# Patient Record
Sex: Female | Born: 1935 | Race: White | Hispanic: No
Health system: Southern US, Community
[De-identification: ages and names within clinical notes are randomized; demographics above are authoritative.]

## PROBLEM LIST (undated history)

## (undated) DIAGNOSIS — J45909 Unspecified asthma, uncomplicated: Secondary | ICD-10-CM

## (undated) DIAGNOSIS — F0390 Unspecified dementia without behavioral disturbance: Secondary | ICD-10-CM

## (undated) DIAGNOSIS — D649 Anemia, unspecified: Secondary | ICD-10-CM

---

## 2008-09-26 ENCOUNTER — Ambulatory Visit: Payer: Self-pay | Admitting: Endocrinology

## 2010-12-02 ENCOUNTER — Ambulatory Visit: Payer: Self-pay | Admitting: Surgery

## 2010-12-03 LAB — PATHOLOGY REPORT

## 2011-06-05 ENCOUNTER — Ambulatory Visit: Payer: Self-pay | Admitting: Surgery

## 2011-12-12 IMAGING — CR US BIOPSY BREAST CORE VACUUM ASSIST
7 of 8 series · 7 of 8 positions shown · non-contrast
Comparison: none

REASON FOR EXAM: LT MICROCALCIFICATIONS
COMMENTS:

[CC (1 of 7)]
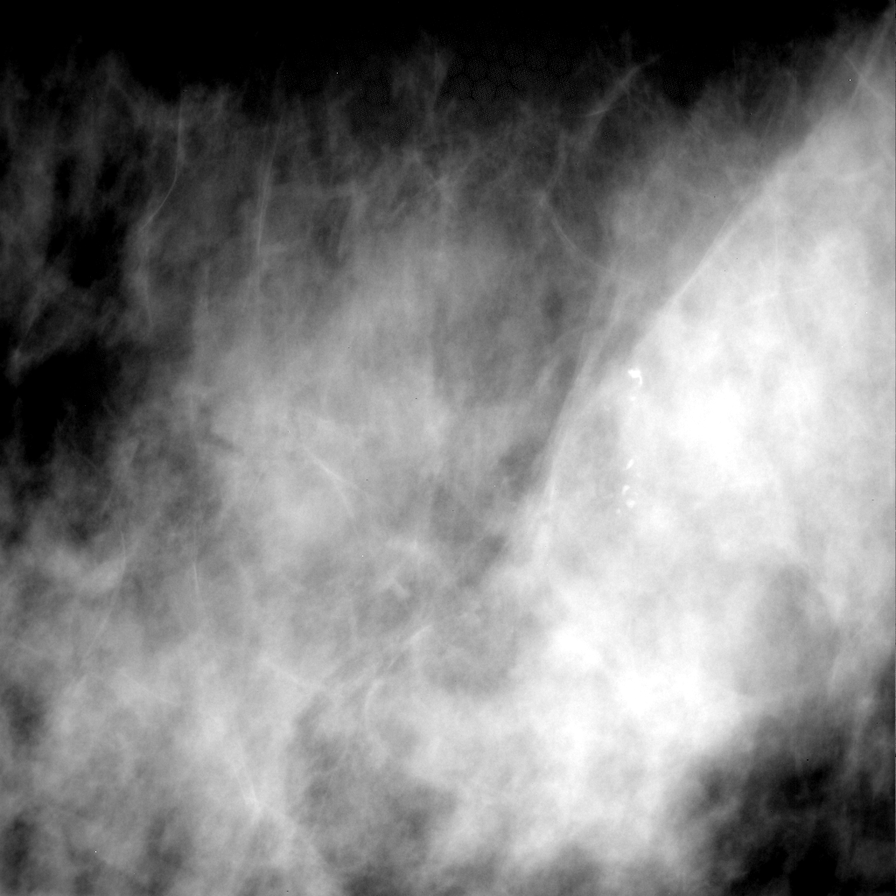

[CC (2 of 7)]
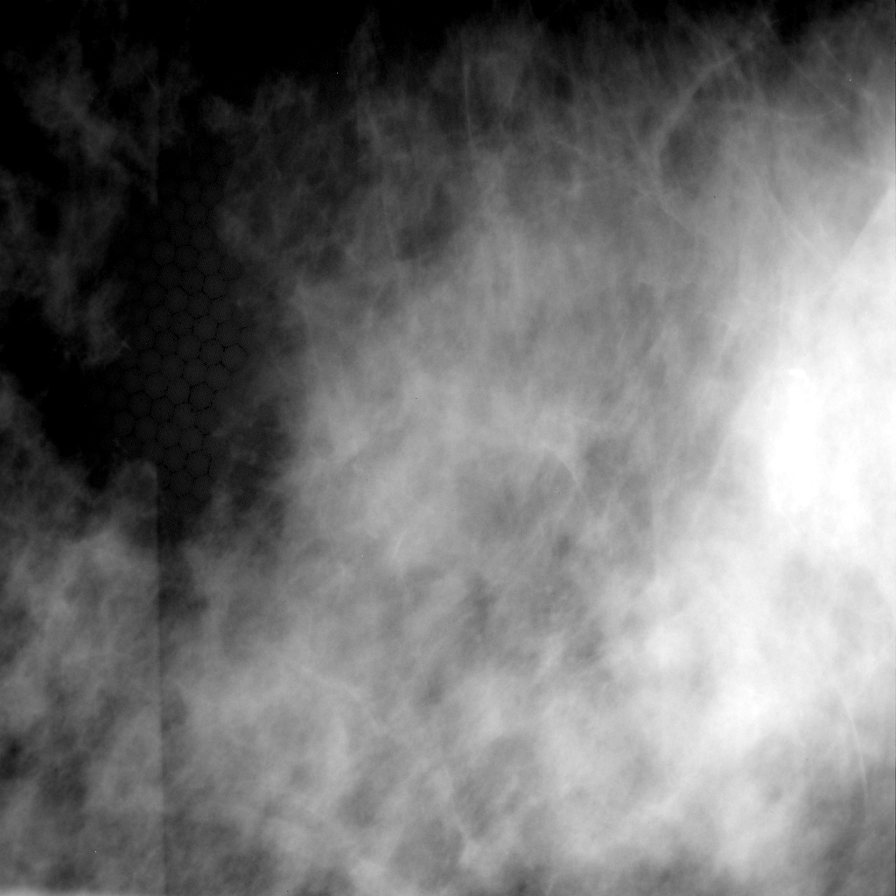

[CC (3 of 7)]
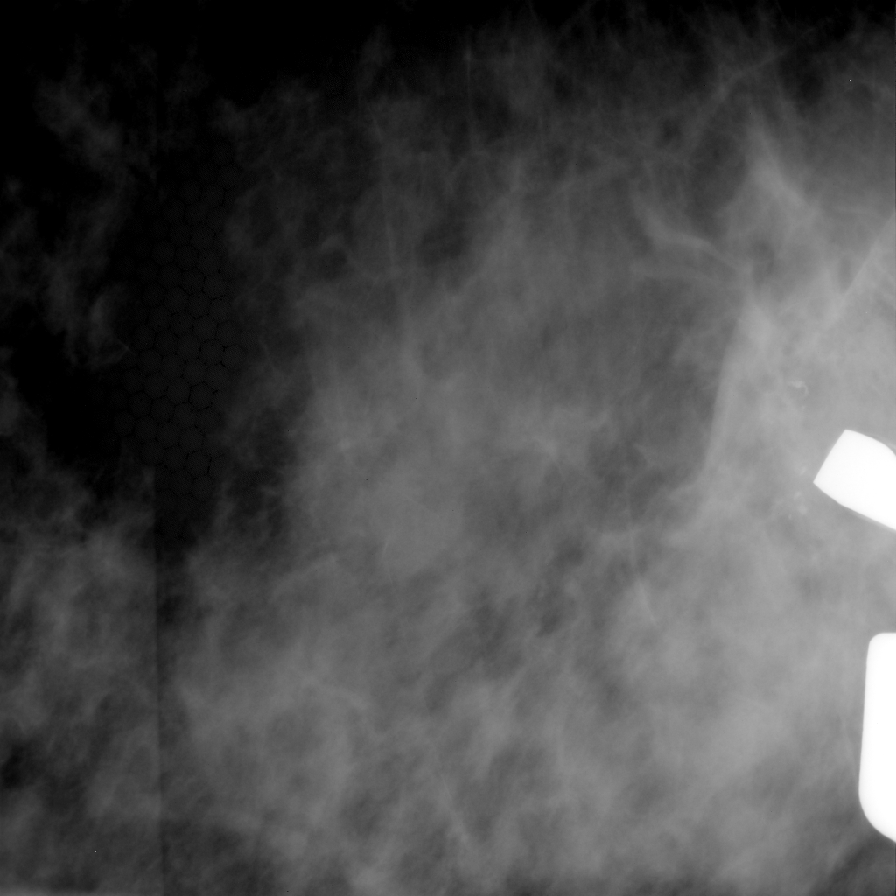

[CC (4 of 7)]
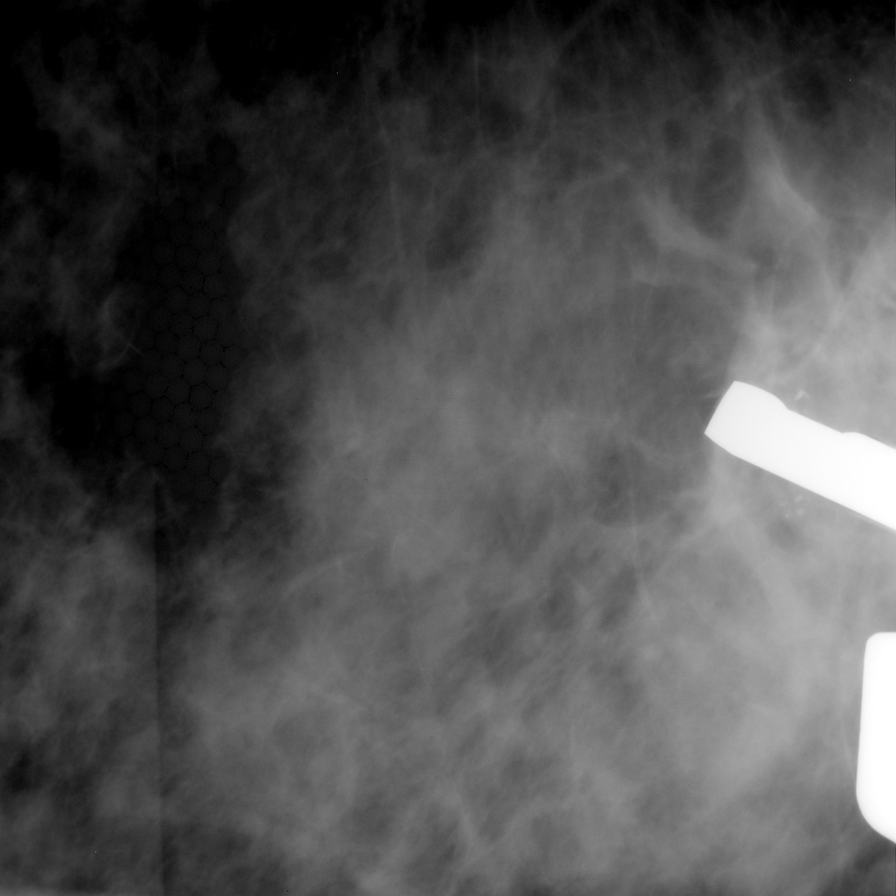

[CC (5 of 7)]
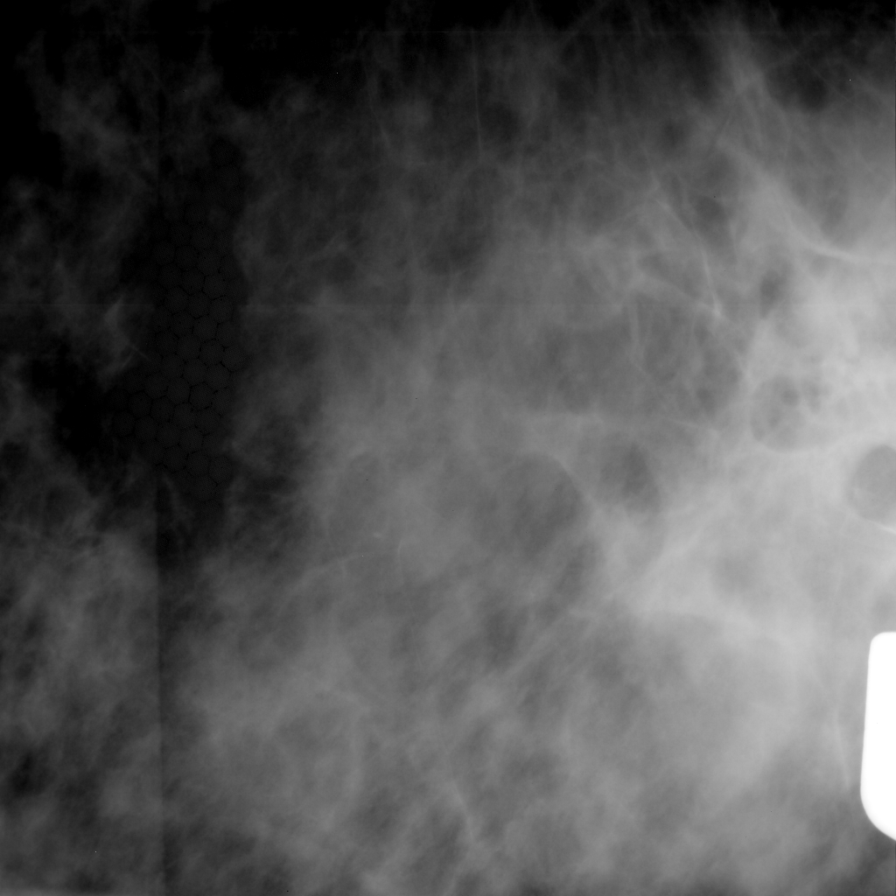

[CC (6 of 7)]
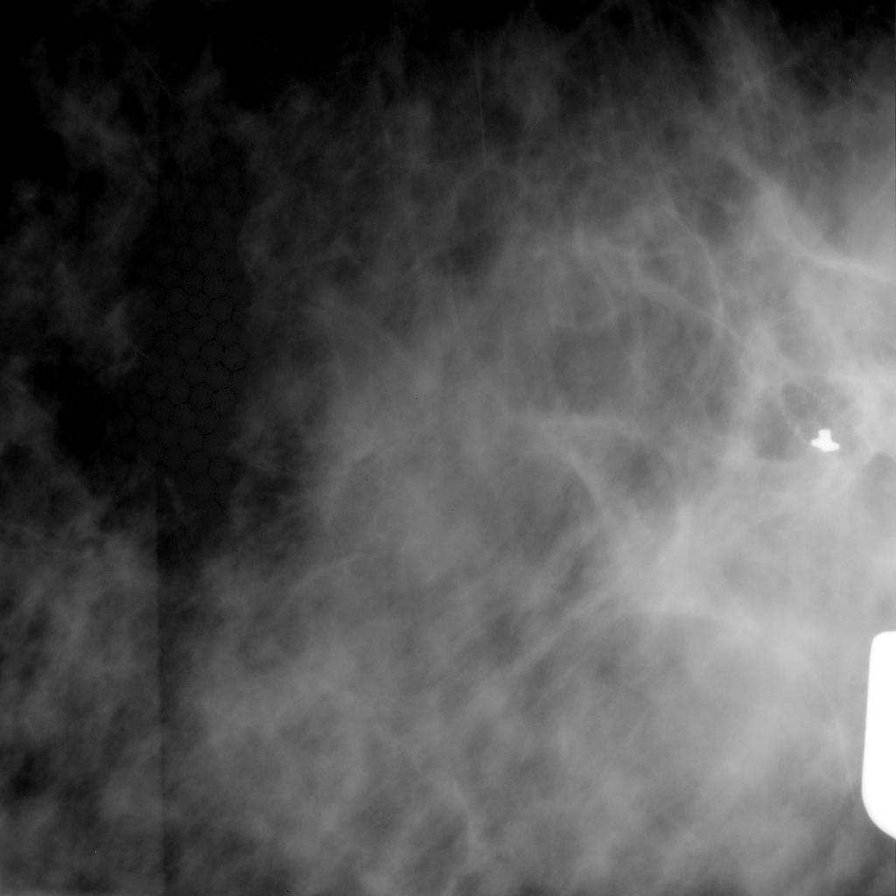

[CC (7 of 7)]
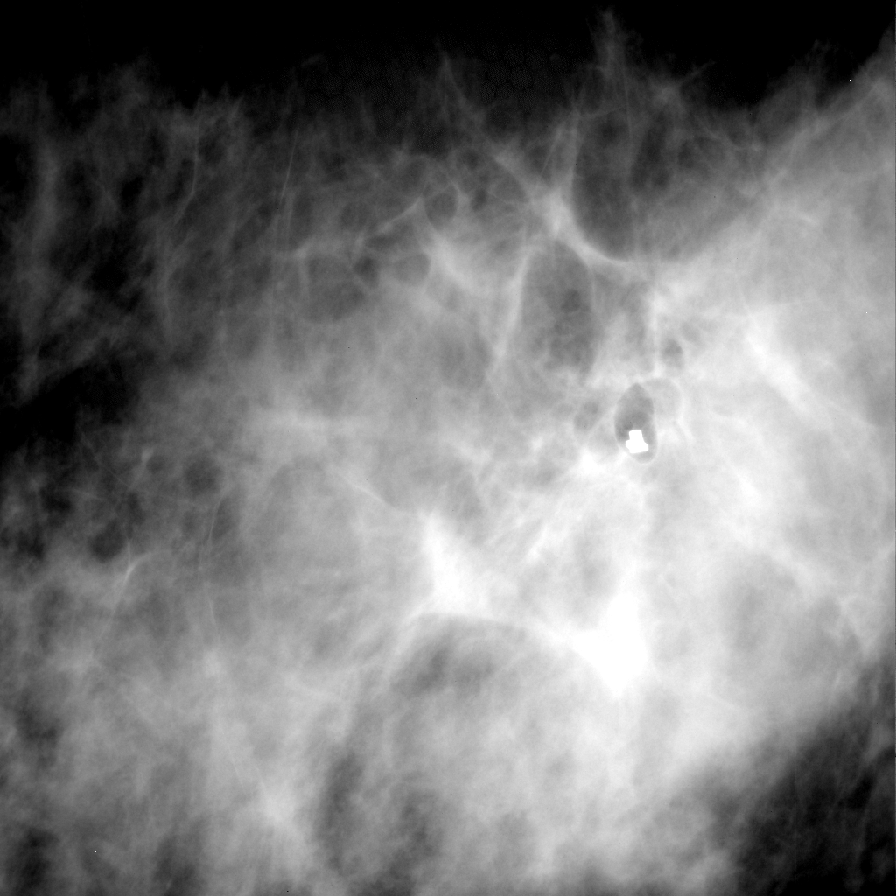

[7 of 8 positions shown; findings below may reference images not displayed]

PROCEDURE:     MAM - MAM STEREOTACTIC VACUUM ASSIST L  - December 02, 2010 [DATE]

RESULT:     The patient's prior mammograms were reviewed. The anticipated
procedure was discussed with Ms. Koh. She voiced her willingness to
proceed.  The patient was placed prone on the stereotactic unit.  Under
mammographic direction the microcalcifications in question were targeted.
The skin was cleansed with an antiseptic solution and the subcutaneous
tissues infiltrated with approximately 4 ml of 1% lidocaine. A small skin
incision was made and the 9 Gauge biopsy needle was introduced into the
breast. Positioning was confirmed and the needle deployed.  Multiple core
samples were then obtained. A marker clip was placed and the needle
withdrawn. The specimen radiograph did reveal the intended target
microcalcifications.
IMPRESSION: 1.The patient underwent stereotactic biopsy of microcalcifications in the
left breast. The patient tolerated the procedure well.  The biopsy results
will go to Dr. Nani. Should the biopsy results be benign, the patient will
undergo six month follow-up mammography of the left breast to assure ongoing
stability.

## 2022-03-02 ENCOUNTER — Emergency Department
Admission: EM | Admit: 2022-03-02 | Discharge: 2022-03-02 | Disposition: A | Discharge status: Skilled Nursing Facility | Payer: Medicare Other | Attending: Emergency Medicine | Admitting: Emergency Medicine

## 2022-03-02 ENCOUNTER — Emergency Department: Payer: Medicare Other

## 2022-03-02 DIAGNOSIS — N3000 Acute cystitis without hematuria: Secondary | ICD-10-CM | POA: Diagnosis not present

## 2022-03-02 DIAGNOSIS — Z20822 Contact with and (suspected) exposure to covid-19: Secondary | ICD-10-CM | POA: Diagnosis not present

## 2022-03-02 DIAGNOSIS — R638 Other symptoms and signs concerning food and fluid intake: Secondary | ICD-10-CM | POA: Insufficient documentation

## 2022-03-02 DIAGNOSIS — F0390 Unspecified dementia without behavioral disturbance: Secondary | ICD-10-CM | POA: Insufficient documentation

## 2022-03-02 LAB — RESP PANEL BY RT-PCR (FLU A&B, COVID) ARPGX2
Influenza A by PCR: NEGATIVE
Influenza B by PCR: NEGATIVE
SARS Coronavirus 2 by RT PCR: NEGATIVE

## 2022-03-02 LAB — CBC WITH DIFFERENTIAL/PLATELET
Abs Immature Granulocytes: 0.04 10*3/uL (ref 0.00–0.07)
Basophils Absolute: 0 10*3/uL (ref 0.0–0.1)
Basophils Relative: 0 %
Eosinophils Absolute: 0.1 10*3/uL (ref 0.0–0.5)
Eosinophils Relative: 1 %
HCT: 33.8 % — ABNORMAL LOW (ref 36.0–46.0)
Hemoglobin: 11.3 g/dL — ABNORMAL LOW (ref 12.0–15.0)
Immature Granulocytes: 1 %
Lymphocytes Relative: 21 %
Lymphs Abs: 1.7 10*3/uL (ref 0.7–4.0)
MCH: 32.3 pg (ref 26.0–34.0)
MCHC: 33.4 g/dL (ref 30.0–36.0)
MCV: 96.6 fL (ref 80.0–100.0)
Monocytes Absolute: 0.5 10*3/uL (ref 0.1–1.0)
Monocytes Relative: 6 %
Neutro Abs: 5.7 10*3/uL (ref 1.7–7.7)
Neutrophils Relative %: 71 %
Platelets: 222 10*3/uL (ref 150–400)
RBC: 3.5 MIL/uL — ABNORMAL LOW (ref 3.87–5.11)
RDW: 14.3 % (ref 11.5–15.5)
WBC: 8 10*3/uL (ref 4.0–10.5)
nRBC: 0 % (ref 0.0–0.2)

## 2022-03-02 LAB — COMPREHENSIVE METABOLIC PANEL
ALT: 14 U/L (ref 0–44)
AST: 45 U/L — ABNORMAL HIGH (ref 15–41)
Albumin: 3.9 g/dL (ref 3.5–5.0)
Alkaline Phosphatase: 37 U/L — ABNORMAL LOW (ref 38–126)
Anion gap: 10 (ref 5–15)
BUN: 26 mg/dL — ABNORMAL HIGH (ref 8–23)
CO2: 32 mmol/L (ref 22–32)
Calcium: 8.9 mg/dL (ref 8.9–10.3)
Chloride: 97 mmol/L — ABNORMAL LOW (ref 98–111)
Creatinine, Ser: 1.61 mg/dL — ABNORMAL HIGH (ref 0.44–1.00)
GFR, Estimated: 31 mL/min — ABNORMAL LOW (ref >60)
Glucose, Bld: 95 mg/dL (ref 70–99)
Potassium: 3 mmol/L — ABNORMAL LOW (ref 3.5–5.1)
Sodium: 139 mmol/L (ref 135–145)
Total Bilirubin: 0.7 mg/dL (ref 0.3–1.2)
Total Protein: 6.7 g/dL (ref 6.5–8.1)

## 2022-03-02 LAB — URINALYSIS, ROUTINE W REFLEX MICROSCOPIC
Bilirubin Urine: NEGATIVE
Glucose, UA: NEGATIVE mg/dL
Hgb urine dipstick: NEGATIVE
Ketones, ur: NEGATIVE mg/dL
Nitrite: POSITIVE — AB
Protein, ur: 30 mg/dL — AB
Specific Gravity, Urine: 1.02 (ref 1.005–1.030)
WBC, UA: >50 WBC/hpf — ABNORMAL HIGH (ref 0–5)
pH: 5 (ref 5.0–8.0)

## 2022-03-02 LAB — LIPASE, BLOOD: Lipase: 42 U/L (ref 11–51)

## 2022-03-02 MED ORDER — SODIUM CHLORIDE 0.9 % IV SOLN
1.0000 g | Freq: Once | INTRAVENOUS | Status: AC
  Administered 2022-03-02: 1 g via INTRAVENOUS
  Filled 2022-03-02: qty 10

## 2022-03-02 MED ORDER — CEPHALEXIN 500 MG PO CAPS: 500.0000 mg | ORAL_CAPSULE | Freq: Two times a day (BID) | ORAL | 0 refills | Status: DC

## 2022-03-02 MED ORDER — SODIUM CHLORIDE 0.9 % IV BOLUS
500.0000 mL | Freq: Once | INTRAVENOUS | Status: AC
  Administered 2022-03-02: 500 mL via INTRAVENOUS

## 2022-03-02 NOTE — ED Notes (Addendum)
Report given to [place] at [facility] at [phone] ?

## 2022-03-02 NOTE — ED Notes (Signed)
Portable Xray at bedside.

## 2022-03-02 NOTE — ED Notes (Signed)
Waiting on [facility] to transport back to [facility] ?

## 2022-03-02 NOTE — ED Triage Notes (Signed)
Pt BIB [facility] from [facility] per family request d/t "not eating." Per EMS, pt recently moved to this facility 2 days ago and had been eating well at home. Hx Alzheimer's/dementia. Pt denies complaints.  ?

## 2022-03-02 NOTE — ED Provider Notes (Signed)
? ?[facility] ?Provider Note ? ? ? Event Date/Time  ? First MD Initiated Contact with Patient 03/02/22 1516   ?  (approximate) ? ? ?History  ? ?Lack of Appetite ? ? ?HPI ? ?Allison [name] is a 88 y.o. female with a past history of dementia who is brought to the ED from [facility] due to poor oral intake for the past 2 days.  No falls fevers or pain.  No vomiting or diarrhea.  No shortness of breath.  Patient denies any complaints. ?  ? ? ?Physical Exam  ? ?Triage Vital Signs: ?ED Triage Vitals  ?Enc Vitals Group  ?   BP 03/02/22 1521 (!) 185/81  ?   Pulse Rate 03/02/22 1521 (!) 52  ?   Resp 03/02/22 1521 16  ?   Temp 03/02/22 1521 97.8 ?F (36.6 ?C)  ?   Temp Source 03/02/22 1521 Oral  ?   SpO2 03/02/22 1516 98 %  ?   Weight --   ?   Height --   ?   Head Circumference --   ?   Peak Flow --   ?   Pain Score --   ?   Pain Loc --   ?   Pain Edu? --   ?   Excl. in GC? --   ? ? ?Most recent vital signs: ?Vitals:  ? 03/02/22 1802 03/02/22 1830  ?BP: (!) 159/119 (!) 172/98  ?Pulse: (!) 51 (!) 49  ?Resp: 11 (!) 23  ?Temp:    ?SpO2: 99% 95%  ? ? ? ?General: Awake, no distress.  ?CV:  Good peripheral perfusion.  Regular rate and rhythm ?Resp:  Normal effort.  Clear to auscultation bilaterally ?Abd:  No distention.  Soft and nontender ?Other:  No calf tenderness or leg swelling.  No wounds or rash.  No signs of trauma or falls.  No bony tenderness.  Moist oral mucosa ? ? ?ED Results / Procedures / Treatments  ? ?Labs ?(all labs ordered are listed, but only abnormal results are displayed) ?Labs Reviewed  ?COMPREHENSIVE METABOLIC PANEL - Abnormal; Notable for the following components:  ?    Result Value  ? Potassium 3.0 (*)   ? Chloride 97 (*)   ? BUN 26 (*)   ? Creatinine, Ser 1.61 (*)   ? AST 45 (*)   ? Alkaline Phosphatase 37 (*)   ? GFR, Estimated 31 (*)   ? All other components within normal limits  ?CBC WITH DIFFERENTIAL/PLATELET - Abnormal; Notable for the following components:  ? RBC 3.50 (*)    ? Hemoglobin 11.3 (*)   ? HCT 33.8 (*)   ? All other components within normal limits  ?URINALYSIS, ROUTINE W REFLEX MICROSCOPIC - Abnormal; Notable for the following components:  ? Color, Urine YELLOW (*)   ? APPearance CLOUDY (*)   ? Protein, ur 30 (*)   ? Nitrite POSITIVE (*)   ? Leukocytes,Ua MODERATE (*)   ? WBC, UA >50 (*)   ? Bacteria, UA RARE (*)   ? All other components within normal limits  ?RESP PANEL BY RT-PCR (FLU A&B, COVID) ARPGX2  ?URINE CULTURE  ?LIPASE, BLOOD  ? ? ? ?EKG ? ?Interpreted by me ?Sinus bradycardia rate 50.  Normal axis, first-degree AV block.  Poor R wave progression.  Normal ST segments and T waves.  No ischemic changes ? ? ?RADIOLOGY ?Chest x-ray viewed and interpreted by me, appears normal.  Radiology report reviewed. ? ? ? ?  PROCEDURES: ? ?Critical Care performed: No ? ?Procedures ? ? ?MEDICATIONS ORDERED IN ED: ?Medications  ?cefTRIAXone (ROCEPHIN) 1 g in sodium chloride 0.9 % 100 mL IVPB (1 g Intravenous New Bag/Given 03/02/22 1846)  ?sodium chloride 0.9 % bolus 500 mL (0 mLs Intravenous Stopped 03/02/22 1639)  ? ? ? ?IMPRESSION / MDM / ASSESSMENT AND PLAN / ED COURSE  ?I reviewed the triage vital signs and the nursing notes. ?             ?               ? ?Differential diagnosis includes, but is not limited to, dehydration, renal insufficiency, electrolyte abnormality, anemia, pneumonia, UTI, viral illness ? ?**The patient is on the cardiac monitor to evaluate for evidence of arrhythmia and/or significant heart rate changes.**} ? ?Patient presents with poor appetite.  She is nontoxic, and reveals a UTI.  She does not require admission due to otherwise reassuring work-up and exam.  We will give a dose of Rocephin in the ED and then discharged back to [facility] with a prescription for Keflex. ?  ? ? ?FINAL CLINICAL IMPRESSION(S) / ED DIAGNOSES  ? ?Final diagnoses:  ?Acute cystitis without hematuria  ?Chronic dementia (HCC)  ? ? ? ?Rx / DC Orders  ? ?ED Discharge Orders   ? ?       Ordered  ?  cephALEXin (KEFLEX) 500 MG capsule  2 times daily       ? 03/02/22 1903  ? ?  ?  ? ?  ? ? ? ?Note:  This document was prepared using Dragon voice recognition software and may include unintentional dictation errors. ?  ?Sharman Cheek, MD ?03/02/22 1908 ? ?

## 2022-03-02 NOTE — Discharge Instructions (Signed)
Your tests in the emergency department showed a urinary tract infection.  Please take Keflex 2 times a day as prescribed to resolve this illness. ?

## 2022-03-05 LAB — URINE CULTURE: Culture: >=100000 — AB

## 2022-06-04 ENCOUNTER — Emergency Department
Admission: EM | Admit: 2022-06-04 | Discharge: 2022-06-05 | Disposition: A | Discharge status: Skilled Nursing Facility | Payer: Medicare Other | Attending: Emergency Medicine | Admitting: Emergency Medicine

## 2022-06-04 ENCOUNTER — Emergency Department: Payer: Medicare Other

## 2022-06-04 DIAGNOSIS — R109 Unspecified abdominal pain: Secondary | ICD-10-CM | POA: Diagnosis present

## 2022-06-04 DIAGNOSIS — F0390 Unspecified dementia without behavioral disturbance: Secondary | ICD-10-CM | POA: Insufficient documentation

## 2022-06-04 DIAGNOSIS — R55 Syncope and collapse: Secondary | ICD-10-CM | POA: Insufficient documentation

## 2022-06-04 DIAGNOSIS — N390 Urinary tract infection, site not specified: Secondary | ICD-10-CM | POA: Insufficient documentation

## 2022-06-04 LAB — CBC WITH DIFFERENTIAL/PLATELET
Abs Immature Granulocytes: 0.03 10*3/uL (ref 0.00–0.07)
Basophils Absolute: 0 10*3/uL (ref 0.0–0.1)
Basophils Relative: 0 %
Eosinophils Absolute: 0.2 10*3/uL (ref 0.0–0.5)
Eosinophils Relative: 2 %
HCT: 32.5 % — ABNORMAL LOW (ref 36.0–46.0)
Hemoglobin: 10.4 g/dL — ABNORMAL LOW (ref 12.0–15.0)
Immature Granulocytes: 0 %
Lymphocytes Relative: 11 %
Lymphs Abs: 1 10*3/uL (ref 0.7–4.0)
MCH: 31.5 pg (ref 26.0–34.0)
MCHC: 32 g/dL (ref 30.0–36.0)
MCV: 98.5 fL (ref 80.0–100.0)
Monocytes Absolute: 0.2 10*3/uL (ref 0.1–1.0)
Monocytes Relative: 2 %
Neutro Abs: 7.8 10*3/uL — ABNORMAL HIGH (ref 1.7–7.7)
Neutrophils Relative %: 85 %
Platelets: 283 10*3/uL (ref 150–400)
RBC: 3.3 MIL/uL — ABNORMAL LOW (ref 3.87–5.11)
RDW: 12.2 % (ref 11.5–15.5)
WBC: 9.2 10*3/uL (ref 4.0–10.5)
nRBC: 0 % (ref 0.0–0.2)

## 2022-06-04 LAB — URINALYSIS, ROUTINE W REFLEX MICROSCOPIC
Bilirubin Urine: NEGATIVE
Glucose, UA: NEGATIVE mg/dL
Ketones, ur: NEGATIVE mg/dL
Leukocytes,Ua: NEGATIVE
Nitrite: POSITIVE — AB
Protein, ur: 100 mg/dL — AB
Specific Gravity, Urine: 1.017 (ref 1.005–1.030)
pH: 9 — ABNORMAL HIGH (ref 5.0–8.0)

## 2022-06-04 LAB — COMPREHENSIVE METABOLIC PANEL
ALT: 13 U/L (ref 0–44)
AST: 24 U/L (ref 15–41)
Albumin: 3.8 g/dL (ref 3.5–5.0)
Alkaline Phosphatase: 48 U/L (ref 38–126)
Anion gap: 6 (ref 5–15)
BUN: 35 mg/dL — ABNORMAL HIGH (ref 8–23)
CO2: 32 mmol/L (ref 22–32)
Calcium: 9.3 mg/dL (ref 8.9–10.3)
Chloride: 104 mmol/L (ref 98–111)
Creatinine, Ser: 1 mg/dL (ref 0.44–1.00)
GFR, Estimated: 55 mL/min — ABNORMAL LOW (ref >60)
Glucose, Bld: 120 mg/dL — ABNORMAL HIGH (ref 70–99)
Potassium: 4.2 mmol/L (ref 3.5–5.1)
Sodium: 142 mmol/L (ref 135–145)
Total Bilirubin: 0.5 mg/dL (ref 0.3–1.2)
Total Protein: 7 g/dL (ref 6.5–8.1)

## 2022-06-04 LAB — LIPASE, BLOOD: Lipase: 55 U/L — ABNORMAL HIGH (ref 11–51)

## 2022-06-04 MED ORDER — CEPHALEXIN 500 MG PO CAPS: 500.0000 mg | ORAL_CAPSULE | Freq: Four times a day (QID) | ORAL | 0 refills | Status: AC

## 2022-06-04 MED ORDER — SODIUM CHLORIDE 0.9 % IV SOLN
1.0000 g | Freq: Once | INTRAVENOUS | Status: AC
  Administered 2022-06-05: 1 g via INTRAVENOUS
  Filled 2022-06-04: qty 10

## 2022-06-04 MED ORDER — LORAZEPAM 2 MG/ML IJ SOLN: 2.0000 mg | Freq: Once | INTRAMUSCULAR | Status: DC

## 2022-06-04 MED ORDER — IOHEXOL 300 MG/ML  SOLN
100.0000 mL | Freq: Once | INTRAMUSCULAR | Status: AC | PRN
  Administered 2022-06-04: 75 mL via INTRAVENOUS

## 2022-06-04 NOTE — Discharge Instructions (Signed)
Please seek medical attention for any high fevers, chest pain, shortness of breath, change in behavior, persistent vomiting, bloody stool or any other new or concerning symptoms.  

## 2022-06-04 NOTE — ED Notes (Signed)
Patient's O2 saturation was down in the high 80's. After I repositioned her, her saturation came back up to the high 90's.

## 2022-06-04 NOTE — ED Notes (Signed)
Patient's O2 saturation is fluctuating due to how she is laying in the bed. Her saturation improves into the mid-90's when I reposition her. I will put her on supplemental O2

## 2022-06-04 NOTE — ED Provider Notes (Signed)
[facility] Provider Note    Event Date/Time   First MD Initiated Contact with Patient 06/04/22 2039     (approximate)   History   Abdominal Pain and Near Syncope (Per EMS, patient C/O abdominal pain that began today and staff states that she looked like she was going to pass out)   HPI  Allison [name] is a 88 y.o. female who presents to the emergency department today via EMS from living facility because of concerns for possible abdominal pain.  Unfortunately the patient has dementia and is unable to give any significant history.  Per report staff found the patient hunched over in her wheelchair complaining of abdominal pain.  Apparently they also felt like she was going to pass out.      Physical Exam   Triage Vital Signs: ED Triage Vitals  Enc Vitals Group     BP 06/04/22 2016 (!) 212/133     Pulse Rate 06/04/22 2016 86     Resp 06/04/22 2016 20     Temp 06/04/22 2016 (!) 97.3 F (36.3 C)     Temp Source 06/04/22 2016 Axillary     SpO2 06/04/22 2016 94 %     Weight 06/04/22 2032 132 lb 11.5 oz (60.2 kg)   Most recent vital signs: Vitals:   06/04/22 2016  BP: (!) 212/133  Pulse: 86  Resp: 20  Temp: (!) 97.3 F (36.3 C)  SpO2: 94%    General: Awake, alert, not oriented. CV:  Good peripheral perfusion. Regular rate and rhythm. Resp:  Normal effort. Lungs clear. Abd:  No distention, diffusely tender.    ED Results / Procedures / Treatments   Labs (all labs ordered are listed, but only abnormal results are displayed) Labs Reviewed  URINALYSIS, ROUTINE W REFLEX MICROSCOPIC - Abnormal; Notable for the following components:      Result Value   Color, Urine YELLOW (*)    APPearance CLOUDY (*)    pH 9.0 (*)    Hgb urine dipstick SMALL (*)    Protein, ur 100 (*)    Nitrite POSITIVE (*)    Bacteria, UA RARE (*)    All other components within normal limits  CBC WITH DIFFERENTIAL/PLATELET - Abnormal; Notable for the following  components:   RBC 3.30 (*)    Hemoglobin 10.4 (*)    HCT 32.5 (*)    Neutro Abs 7.8 (*)    All other components within normal limits  COMPREHENSIVE METABOLIC PANEL - Abnormal; Notable for the following components:   Glucose, Bld 120 (*)    BUN 35 (*)    GFR, Estimated 55 (*)    All other components within normal limits  LIPASE, BLOOD - Abnormal; Notable for the following components:   Lipase 55 (*)    All other components within normal limits  URINE CULTURE     EKG  I, Phineas Semen, attending physician, personally viewed and interpreted this EKG  EKG Time: 2015 Rate: 59 Rhythm: sinus rhythm Axis: normal Intervals: qtc 430 QRS: narrow, q waves v1 ST changes: no st elevation Impression: abnormal ekg    RADIOLOGY I independently interpreted and visualized the CT abd/pel. My interpretation: No free air Radiology interpretation:  IMPRESSION:  No acute findings in the abdomen or pelvis.    Aortic atherosclerosis.    Bibasilar scarring.  Small pericardial effusion.     PROCEDURES:  Critical Care performed: No  Procedures   MEDICATIONS ORDERED IN ED: Medications - No  data to display   IMPRESSION / MDM / ASSESSMENT AND PLAN / ED COURSE  I reviewed the triage vital signs and the nursing notes.                              Differential diagnosis includes, but is not limited to, intraabdominal infection, SBO, UTI.  Patient's presentation is most consistent with acute presentation with potential threat to life or bodily function.  Patient presented to the emergency department today because of concerns for possible abdominal pain.  Patient was unable to give any history here.  On exam she did have some tenderness diffusely throughout her abdomen.  CT scan without any concerning intra-abdominal findings.  Urine did come back positive for urinary tract infection.  I do think this could explain some of the patient's pain.  Will give dose of IV antibiotics here and  plan on discharging with further antibiotics.  FINAL CLINICAL IMPRESSION(S) / ED DIAGNOSES   Final diagnoses:  Lower urinary tract infectious disease   Note:  This document was prepared using Dragon voice recognition software and may include unintentional dictation errors.    Phineas Semen, MD 06/05/22 [phone]

## 2022-06-07 LAB — URINE CULTURE: Culture: >=100000 — AB

## 2022-07-15 ENCOUNTER — Emergency Department
Admission: EM | Admit: 2022-07-15 | Discharge: 2022-07-15 | Disposition: A | Discharge status: Home / Self Care | Payer: Medicare Other | Attending: Emergency Medicine | Admitting: Emergency Medicine

## 2022-07-15 ENCOUNTER — Encounter: Payer: Self-pay | Admitting: Emergency Medicine

## 2022-07-15 ENCOUNTER — Other Ambulatory Visit: Payer: Self-pay

## 2022-07-15 DIAGNOSIS — R109 Unspecified abdominal pain: Secondary | ICD-10-CM | POA: Diagnosis present

## 2022-07-15 DIAGNOSIS — F0390 Unspecified dementia without behavioral disturbance: Secondary | ICD-10-CM | POA: Diagnosis not present

## 2022-07-15 DIAGNOSIS — N390 Urinary tract infection, site not specified: Secondary | ICD-10-CM | POA: Diagnosis not present

## 2022-07-15 LAB — COMPREHENSIVE METABOLIC PANEL
ALT: 12 U/L (ref 0–44)
AST: 24 U/L (ref 15–41)
Albumin: 3.2 g/dL — ABNORMAL LOW (ref 3.5–5.0)
Alkaline Phosphatase: 45 U/L (ref 38–126)
Anion gap: 7 (ref 5–15)
BUN: 24 mg/dL — ABNORMAL HIGH (ref 8–23)
CO2: 29 mmol/L (ref 22–32)
Calcium: 9 mg/dL (ref 8.9–10.3)
Chloride: 103 mmol/L (ref 98–111)
Creatinine, Ser: 0.81 mg/dL (ref 0.44–1.00)
GFR, Estimated: >60 mL/min (ref >60)
Glucose, Bld: 92 mg/dL (ref 70–99)
Potassium: 3.8 mmol/L (ref 3.5–5.1)
Sodium: 139 mmol/L (ref 135–145)
Total Bilirubin: 0.5 mg/dL (ref 0.3–1.2)
Total Protein: 6.4 g/dL — ABNORMAL LOW (ref 6.5–8.1)

## 2022-07-15 LAB — CBC
HCT: 33.1 % — ABNORMAL LOW (ref 36.0–46.0)
Hemoglobin: 10.3 g/dL — ABNORMAL LOW (ref 12.0–15.0)
MCH: 29.5 pg (ref 26.0–34.0)
MCHC: 31.1 g/dL (ref 30.0–36.0)
MCV: 94.8 fL (ref 80.0–100.0)
Platelets: 413 10*3/uL — ABNORMAL HIGH (ref 150–400)
RBC: 3.49 MIL/uL — ABNORMAL LOW (ref 3.87–5.11)
RDW: 13 % (ref 11.5–15.5)
WBC: 6.9 10*3/uL (ref 4.0–10.5)
nRBC: 0 % (ref 0.0–0.2)

## 2022-07-15 LAB — URINALYSIS, ROUTINE W REFLEX MICROSCOPIC
Bilirubin Urine: NEGATIVE
Glucose, UA: NEGATIVE mg/dL
Ketones, ur: NEGATIVE mg/dL
Nitrite: NEGATIVE
Protein, ur: NEGATIVE mg/dL
Specific Gravity, Urine: 1.016 (ref 1.005–1.030)
WBC, UA: >50 WBC/hpf — ABNORMAL HIGH (ref 0–5)
pH: 5 (ref 5.0–8.0)

## 2022-07-15 LAB — LIPASE, BLOOD: Lipase: 44 U/L (ref 11–51)

## 2022-07-15 MED ORDER — CEFDINIR 300 MG PO CAPS: 300.0000 mg | ORAL_CAPSULE | Freq: Two times a day (BID) | ORAL | 0 refills | Status: AC

## 2022-07-15 NOTE — ED Notes (Signed)
Pt alert, discharge instructions given to pt's son, pt assisted by RN to vehicle.

## 2022-07-15 NOTE — ED Provider Triage Note (Signed)
Emergency Medicine Provider Triage Evaluation Note  JAASIA VIGLIONE , a 88 y.o. female  was evaluated in triage.  Pt denies complaints at this time, but earlier she had complained of a headache and stomachache. No vomiting or diarrhea. Her son is here with her.   Physical Exam  BP (!) 153/75 (BP Location: Left Arm)   Pulse (!) 56   Resp 18   SpO2 96%  Gen:   Awake, no distress   Resp:  Normal effort  MSK:   Moves extremities without difficulty  Other:    Medical Decision Making  Medically screening exam initiated at 1:11 PM.  Appropriate orders placed.  [name] was informed that the remainder of the evaluation will be completed by another provider, this initial triage assessment does not replace that evaluation, and the importance of remaining in the ED until their evaluation is complete.  Son and I discussed treatment plan. He would like her to have basic labs and a urinalysis.    Chinita Pester, FNP 07/15/22 1323

## 2022-07-15 NOTE — ED Provider Notes (Signed)
[facility] Provider Note   Event Date/Time   First MD Initiated Contact with Patient 07/15/22 1714     (approximate) History  Abdominal Pain  HPI Allison [name] is a 88 y.o. female with a past medical history of severe dementia who presents with son at bedside from a long-term care facility with complaints of abdominal pain.  Patient is a extremely poor historian however son states that she had been complaining of some abdominal pain over the last 24 hours and that this is how she presented last 2 times she had a urinary tract infection.    Physical Exam  Triage Vital Signs: ED Triage Vitals  Enc Vitals Group     BP 07/15/22 1247 (!) 153/75     Pulse Rate 07/15/22 1247 (!) 56     Resp 07/15/22 1247 18     Temp 07/15/22 1314 97.6 F (36.4 C)     Temp Source 07/15/22 1314 Axillary     SpO2 07/15/22 1247 96 %     Weight 07/15/22 1314 112 lb (50.8 kg)     Height 07/15/22 1314 5\' 6"  (1.676 m)     Head Circumference --      Peak Flow --      Pain Score 07/15/22 1314 0     Pain Loc --      Pain Edu? --      Excl. in GC? --    Most recent vital signs: Vitals:   07/15/22 1616 07/15/22 1829  BP: (!) 158/132 (!) 158/137  Pulse: (!) 28 78  Resp: 18 18  Temp:  97.6 F (36.4 C)  SpO2: (!) 79% 100%   General: Awake, cooperative CV:  Good peripheral perfusion.  Resp:  Normal effort.  Clear to auscultation bilaterally Abd:  No distention.  No tenderness to palpation Other:  Elderly cachectic female sitting on stretcher in no acute distress ED Results / Procedures / Treatments  Labs (all labs ordered are listed, but only abnormal results are displayed) Labs Reviewed  COMPREHENSIVE METABOLIC PANEL - Abnormal; Notable for the following components:      Result Value   BUN 24 (*)    Total Protein 6.4 (*)    Albumin 3.2 (*)    All other components within normal limits  CBC - Abnormal; Notable for the following components:   RBC 3.49 (*)    Hemoglobin  10.3 (*)    HCT 33.1 (*)    Platelets 413 (*)    All other components within normal limits  URINALYSIS, ROUTINE W REFLEX MICROSCOPIC - Abnormal; Notable for the following components:   Color, Urine YELLOW (*)    APPearance CLOUDY (*)    Hgb urine dipstick SMALL (*)    Leukocytes,Ua MODERATE (*)    WBC, UA >50 (*)    Bacteria, UA MANY (*)    All other components within normal limits  LIPASE, BLOOD   PROCEDURES: Critical Care performed: No .1-3 Lead EKG Interpretation  Performed by: 07/17/22, MD Authorized by: Merwyn Katos, MD     Interpretation: normal     ECG rate:  77   ECG rate assessment: normal     Rhythm: sinus rhythm     Ectopy: none     Conduction: normal    MEDICATIONS ORDERED IN ED: Medications - No data to display IMPRESSION / MDM / ASSESSMENT AND PLAN / ED COURSE  I reviewed the triage vital signs and the nursing notes.  The patient is on the cardiac monitor to evaluate for evidence of arrhythmia and/or significant heart rate changes. Patient's presentation is most consistent with acute presentation with potential threat to life or bodily function. Not Pregnant. Unlikely TOA, Ovarian Torsion, PID, gonorrhea/chlamydia. Low suspicion for Infected Urolithiasis, AAA, Cholecystitis, Pancreatitis, SBO, Appendicitis, or other acute abdomen.  No evidence of worsened altered mental status or decreased function.  I discussed at length with son at bedside the possibility of admission given her symptoms however he states that this is common for her and that she has adequate supervision at her long-term care facility.  Patient's son feels comfortable with plan for discharge and follow-up with primary care provider within the next 1-3 days  Rx: Cefdinir 300 mg BID for 5 days Disposition: Discharge home. SRP discussed. Advise follow up with primary care provider within 24-72 hours.   FINAL CLINICAL IMPRESSION(S) / ED DIAGNOSES   Final  diagnoses:  Lower urinary tract infectious disease   Rx / DC Orders   ED Discharge Orders          Ordered    cefdinir (OMNICEF) 300 MG capsule  2 times daily        07/15/22 1821           Note:  This document was prepared using Dragon voice recognition software and may include unintentional dictation errors.   Merwyn Katos, MD 07/15/22 [phone]

## 2022-07-15 NOTE — ED Triage Notes (Signed)
Patient sent to ED via [facility] from [facility] for abd pain, headache, and HTN. Patient has hx of dementia- son with patient. Patient has no complaints at this time.

## 2022-10-10 ENCOUNTER — Emergency Department: Payer: Medicare Other

## 2022-10-10 ENCOUNTER — Other Ambulatory Visit: Payer: Self-pay

## 2022-10-10 ENCOUNTER — Emergency Department
Admission: EM | Admit: 2022-10-10 | Discharge: 2022-10-10 | Disposition: A | Discharge status: Skilled Nursing Facility | Payer: Medicare Other | Attending: Emergency Medicine | Admitting: Emergency Medicine

## 2022-10-10 DIAGNOSIS — F0390 Unspecified dementia without behavioral disturbance: Secondary | ICD-10-CM | POA: Insufficient documentation

## 2022-10-10 DIAGNOSIS — Z85850 Personal history of malignant neoplasm of thyroid: Secondary | ICD-10-CM | POA: Insufficient documentation

## 2022-10-10 DIAGNOSIS — R1084 Generalized abdominal pain: Secondary | ICD-10-CM | POA: Insufficient documentation

## 2022-10-10 DIAGNOSIS — R109 Unspecified abdominal pain: Secondary | ICD-10-CM | POA: Diagnosis present

## 2022-10-10 LAB — TROPONIN I (HIGH SENSITIVITY): Troponin I (High Sensitivity): 14 ng/L (ref <18)

## 2022-10-10 LAB — CBC WITH DIFFERENTIAL/PLATELET
Abs Immature Granulocytes: 0.02 10*3/uL (ref 0.00–0.07)
Basophils Absolute: 0 10*3/uL (ref 0.0–0.1)
Basophils Relative: 0 %
Eosinophils Absolute: 0.2 10*3/uL (ref 0.0–0.5)
Eosinophils Relative: 3 %
HCT: 33.2 % — ABNORMAL LOW (ref 36.0–46.0)
Hemoglobin: 10.3 g/dL — ABNORMAL LOW (ref 12.0–15.0)
Immature Granulocytes: 0 %
Lymphocytes Relative: 21 %
Lymphs Abs: 1.5 10*3/uL (ref 0.7–4.0)
MCH: 30.2 pg (ref 26.0–34.0)
MCHC: 31 g/dL (ref 30.0–36.0)
MCV: 97.4 fL (ref 80.0–100.0)
Monocytes Absolute: 0.6 10*3/uL (ref 0.1–1.0)
Monocytes Relative: 8 %
Neutro Abs: 4.7 10*3/uL (ref 1.7–7.7)
Neutrophils Relative %: 68 %
Platelets: 260 10*3/uL (ref 150–400)
RBC: 3.41 MIL/uL — ABNORMAL LOW (ref 3.87–5.11)
RDW: 14.1 % (ref 11.5–15.5)
WBC: 7 10*3/uL (ref 4.0–10.5)
nRBC: 0 % (ref 0.0–0.2)

## 2022-10-10 LAB — COMPREHENSIVE METABOLIC PANEL
ALT: 12 U/L (ref 0–44)
AST: 24 U/L (ref 15–41)
Albumin: 3.5 g/dL (ref 3.5–5.0)
Alkaline Phosphatase: 42 U/L (ref 38–126)
Anion gap: 8 (ref 5–15)
BUN: 35 mg/dL — ABNORMAL HIGH (ref 8–23)
CO2: 31 mmol/L (ref 22–32)
Calcium: 9.7 mg/dL (ref 8.9–10.3)
Chloride: 105 mmol/L (ref 98–111)
Creatinine, Ser: 0.9 mg/dL (ref 0.44–1.00)
GFR, Estimated: >60 mL/min (ref >60)
Glucose, Bld: 109 mg/dL — ABNORMAL HIGH (ref 70–99)
Potassium: 3.6 mmol/L (ref 3.5–5.1)
Sodium: 144 mmol/L (ref 135–145)
Total Bilirubin: 0.4 mg/dL (ref 0.3–1.2)
Total Protein: 6.4 g/dL — ABNORMAL LOW (ref 6.5–8.1)

## 2022-10-10 LAB — LIPASE, BLOOD: Lipase: 50 U/L (ref 11–51)

## 2022-10-10 LAB — URINALYSIS, ROUTINE W REFLEX MICROSCOPIC
Bilirubin Urine: NEGATIVE
Glucose, UA: NEGATIVE mg/dL
Hgb urine dipstick: NEGATIVE
Ketones, ur: NEGATIVE mg/dL
Leukocytes,Ua: NEGATIVE
Nitrite: NEGATIVE
Protein, ur: NEGATIVE mg/dL
Specific Gravity, Urine: 1.017 (ref 1.005–1.030)
pH: 7 (ref 5.0–8.0)

## 2022-10-10 NOTE — ED Notes (Signed)
First Nurse Note: Pt to ED via [facility] from [facility] for RUQ abdominal pain. Pt vitals as follows  BP: 180/76 HR: 56 SpO2: 98% CBG 139 20 LAC

## 2022-10-10 NOTE — ED Notes (Signed)
Report called to [facility]. 

## 2022-10-10 NOTE — Discharge Instructions (Signed)
Your CT scan did not show any findings to explain your abdominal pain.  Your urine did not appear infected, you had no signs of urinary tract infection.  Return to the emergency department if your abdominal pain returns.

## 2022-10-10 NOTE — ED Provider Notes (Addendum)
Care assumed from Dr. Starleen Blue, currently waiting for UA.  Came in with lower abdominal pain.  Labs reassuring.  CT scan with no acute findings.  Plan for treat for urinary tract infection if comes back concerning for UTI plans to go home regardless.  No signs of urinary tract infection.  Will discharge home with return precautions.   Nathaniel Man, MD 10/10/22 1531    Nathaniel Man, MD 10/10/22 1551

## 2022-10-10 NOTE — ED Provider Triage Note (Signed)
Emergency Medicine Provider Triage Evaluation Note  Allison [name] , a 88 y.o. female with a history of dementia was evaluated in triage.  Pt complains of right upper quadrant abdominal pain. Son was called by her nursing home. Reports that it began today. No history of abdominal surgery.patient reports no pain now and feels normal.  There are no problems to display for this patient.  .  Review of Systems  Positive: Abd pain Negative: Fever, n/v/d  Physical Exam  There were no vitals taken for this visit. Gen:   Awake, no distress   Resp:  Normal effort  MSK:   Moves extremities without difficulty  Other:  Right sided abdominal tenderness  Medical Decision Making  Medically screening exam initiated at 12:55 PM.  Appropriate orders placed.  Karyl Kinnier was informed that the remainder of the evaluation will be completed by another provider, this initial triage assessment does not replace that evaluation, and the importance of remaining in the ED until their evaluation is complete.     Marquette Old, PA-C 10/10/22 1303

## 2022-10-10 NOTE — ED Provider Notes (Signed)
[facility] Provider Note    Event Date/Time   First MD Initiated Contact with Patient 10/10/22 1341     (approximate)   History   Abdominal Pain   HPI  Allison [name] is a 88 y.o. female  with pmh dementia, remote hx of thyroid cancer who presents with abdominal pain.  Patient has history of dementia so history is somewhat limited.  She denies any pain currently said she was having pain earlier.  Apparently she told staff at the nursing facility that she was having some chest and abdominal pain.  Patient denies any chest pain currently denies shortness of breath.  Her son denies any report of nausea vomiting diarrhea or constipation.     No past medical history on file.  There are no problems to display for this patient.    Physical Exam  Triage Vital Signs: ED Triage Vitals  Enc Vitals Group     BP 10/10/22 1257 (!) 174/64     Pulse Rate 10/10/22 1257 60     Resp 10/10/22 1257 18     Temp --      Temp src --      SpO2 10/10/22 1257 96 %     Weight --      Height 10/10/22 1303 5\' 6"  (1.676 m)     Head Circumference --      Peak Flow --      Pain Score --      Pain Loc --      Pain Edu? --      Excl. in [place]? --     Most recent vital signs: Vitals:   10/10/22 1257  BP: (!) 174/64  Pulse: 60  Resp: 18  SpO2: 96%     General: Awake, no distress.  CV:  Good peripheral perfusion.  Resp:  Normal effort.  Abd:  No distention.  Abdominal soft nontender throughout Neuro:             Awake, Alert, hard of hearing Other:  No CVA tenderness   ED Results / Procedures / Treatments  Labs (all labs ordered are listed, but only abnormal results are displayed) Labs Reviewed  COMPREHENSIVE METABOLIC PANEL - Abnormal; Notable for the following components:      Result Value   Glucose, Bld 109 (*)    BUN 35 (*)    Total Protein 6.4 (*)    All other components within normal limits  CBC WITH DIFFERENTIAL/PLATELET - Abnormal; Notable for  the following components:   RBC 3.41 (*)    Hemoglobin 10.3 (*)    HCT 33.2 (*)    All other components within normal limits  LIPASE, BLOOD  URINALYSIS, ROUTINE W REFLEX MICROSCOPIC  TROPONIN I (HIGH SENSITIVITY)     EKG  EKG interpretation performed by myself: NSR, nml axis, nml intervals, no acute ischemic changes    RADIOLOGY I reviewed and interpreted the CXR which does not show any acute cardiopulmonary process    PROCEDURES:  Critical Care performed: No  Procedures  MEDICATIONS ORDERED IN ED: Medications - No data to display   IMPRESSION / MDM / [place] / ED COURSE  I reviewed the triage vital signs and the nursing notes.                              Patient's presentation is most consistent with acute presentation with potential threat to life  or bodily function.  Differential diagnosis includes, but is not limited to, appendicitis, kidney stone, pyelonephritis, diverticulitis, constipation, SBO  The patient is an 88 year old female with severe dementia presents today with abdominal pain.  History is difficult because patient has dementia is hard of hearing she is denying any pain currently.  Her son tells me that the facility said she complained of both chest and abdominal pain at some point.  Patient's abdomen is benign currently she has no CVA tenderness.  EKG is nonischemic.  Labs including CBC and CMP are overall reassuring.  Given she is somewhat difficult to assess I did obtain a CT of the abdomen pelvis without contrast that does not have any acute findings.  Chest x-ray also without focal process.  She is still waiting on a troponin and urinalysis.  If UTI will need antibiotics but I think she is appropriate for discharge if troponin is negative.       FINAL CLINICAL IMPRESSION(S) / ED DIAGNOSES   Final diagnoses:  Generalized abdominal pain     Rx / DC Orders   ED Discharge Orders     None        Note:  This document was  prepared using Dragon voice recognition software and may include unintentional dictation errors.   Georga Hacking, MD 10/10/22 1450

## 2022-10-10 NOTE — ED Notes (Signed)
EMS iv removed

## 2022-10-10 NOTE — ED Triage Notes (Addendum)
Per EMS report, staff at [facility] reports patient c/o right upper quadrant abdominal pain, but no nausea or vomiting. Patient has dementia.  Patient denies pain. Son is with patient in triage. Son states patient is at baseline.

## 2023-01-07 ENCOUNTER — Emergency Department: Payer: Medicare Other

## 2023-01-07 ENCOUNTER — Other Ambulatory Visit: Payer: Self-pay

## 2023-01-07 ENCOUNTER — Emergency Department
Admission: EM | Admit: 2023-01-07 | Discharge: 2023-01-07 | Disposition: A | Discharge status: Home / Self Care | Payer: Medicare Other | Attending: Emergency Medicine | Admitting: Emergency Medicine

## 2023-01-07 ENCOUNTER — Encounter: Payer: Self-pay | Admitting: Emergency Medicine

## 2023-01-07 DIAGNOSIS — R8271 Bacteriuria: Secondary | ICD-10-CM | POA: Diagnosis not present

## 2023-01-07 DIAGNOSIS — Z20822 Contact with and (suspected) exposure to covid-19: Secondary | ICD-10-CM | POA: Diagnosis not present

## 2023-01-07 DIAGNOSIS — Z85850 Personal history of malignant neoplasm of thyroid: Secondary | ICD-10-CM | POA: Diagnosis not present

## 2023-01-07 DIAGNOSIS — R109 Unspecified abdominal pain: Secondary | ICD-10-CM | POA: Insufficient documentation

## 2023-01-07 DIAGNOSIS — J019 Acute sinusitis, unspecified: Secondary | ICD-10-CM | POA: Diagnosis not present

## 2023-01-07 DIAGNOSIS — F0390 Unspecified dementia without behavioral disturbance: Secondary | ICD-10-CM | POA: Diagnosis not present

## 2023-01-07 DIAGNOSIS — R531 Weakness: Secondary | ICD-10-CM | POA: Diagnosis not present

## 2023-01-07 DIAGNOSIS — R0981 Nasal congestion: Secondary | ICD-10-CM

## 2023-01-07 HISTORY — DX: Unspecified dementia, unspecified severity, without behavioral disturbance, psychotic disturbance, mood disturbance, and anxiety: F03.90

## 2023-01-07 HISTORY — DX: Unspecified asthma, uncomplicated: J45.909

## 2023-01-07 HISTORY — DX: Anemia, unspecified: D64.9

## 2023-01-07 LAB — CBC WITH DIFFERENTIAL/PLATELET
Abs Immature Granulocytes: 0.02 10*3/uL (ref 0.00–0.07)
Basophils Absolute: 0 10*3/uL (ref 0.0–0.1)
Basophils Relative: 0 %
Eosinophils Absolute: 0 10*3/uL (ref 0.0–0.5)
Eosinophils Relative: 0 %
HCT: 38.6 % (ref 36.0–46.0)
Hemoglobin: 12.3 g/dL (ref 12.0–15.0)
Immature Granulocytes: 0 %
Lymphocytes Relative: 16 %
Lymphs Abs: 1.3 10*3/uL (ref 0.7–4.0)
MCH: 30.5 pg (ref 26.0–34.0)
MCHC: 31.9 g/dL (ref 30.0–36.0)
MCV: 95.8 fL (ref 80.0–100.0)
Monocytes Absolute: 1.1 10*3/uL — ABNORMAL HIGH (ref 0.1–1.0)
Monocytes Relative: 13 %
Neutro Abs: 5.6 10*3/uL (ref 1.7–7.7)
Neutrophils Relative %: 71 %
Platelets: 217 10*3/uL (ref 150–400)
RBC: 4.03 MIL/uL (ref 3.87–5.11)
RDW: 13.2 % (ref 11.5–15.5)
WBC: 8 10*3/uL (ref 4.0–10.5)
nRBC: 0 % (ref 0.0–0.2)

## 2023-01-07 LAB — URINALYSIS, COMPLETE (UACMP) WITH MICROSCOPIC
Bilirubin Urine: NEGATIVE
Glucose, UA: NEGATIVE mg/dL
Ketones, ur: NEGATIVE mg/dL
Leukocytes,Ua: NEGATIVE
Nitrite: NEGATIVE
Protein, ur: NEGATIVE mg/dL
Specific Gravity, Urine: 1.038 — ABNORMAL HIGH (ref 1.005–1.030)
pH: 7 (ref 5.0–8.0)

## 2023-01-07 LAB — COMPREHENSIVE METABOLIC PANEL
ALT: 16 U/L (ref 0–44)
AST: 31 U/L (ref 15–41)
Albumin: 3.6 g/dL (ref 3.5–5.0)
Alkaline Phosphatase: 27 U/L — ABNORMAL LOW (ref 38–126)
Anion gap: 9 (ref 5–15)
BUN: 24 mg/dL — ABNORMAL HIGH (ref 8–23)
CO2: 28 mmol/L (ref 22–32)
Calcium: 8.7 mg/dL — ABNORMAL LOW (ref 8.9–10.3)
Chloride: 101 mmol/L (ref 98–111)
Creatinine, Ser: 1.04 mg/dL — ABNORMAL HIGH (ref 0.44–1.00)
GFR, Estimated: 52 mL/min — ABNORMAL LOW (ref >60)
Glucose, Bld: 105 mg/dL — ABNORMAL HIGH (ref 70–99)
Potassium: 3.7 mmol/L (ref 3.5–5.1)
Sodium: 138 mmol/L (ref 135–145)
Total Bilirubin: 0.8 mg/dL (ref 0.3–1.2)
Total Protein: 6.8 g/dL (ref 6.5–8.1)

## 2023-01-07 LAB — TROPONIN I (HIGH SENSITIVITY)
Troponin I (High Sensitivity): 46 ng/L — ABNORMAL HIGH (ref <18)
Troponin I (High Sensitivity): 43 ng/L — ABNORMAL HIGH (ref <18)

## 2023-01-07 LAB — LACTIC ACID, PLASMA
Lactic Acid, Venous: 1.5 mmol/L (ref 0.5–1.9)
Lactic Acid, Venous: <0.3 mmol/L — ABNORMAL LOW (ref 0.5–1.9)

## 2023-01-07 LAB — RESP PANEL BY RT-PCR (RSV, FLU A&B, COVID)  RVPGX2
Influenza A by PCR: NEGATIVE
Influenza B by PCR: NEGATIVE
Resp Syncytial Virus by PCR: NEGATIVE
SARS Coronavirus 2 by RT PCR: NEGATIVE

## 2023-01-07 MED ORDER — DROPERIDOL 2.5 MG/ML IJ SOLN
2.5000 mg | Freq: Once | INTRAMUSCULAR | Status: AC
  Administered 2023-01-07: 2.5 mg via INTRAVENOUS
  Filled 2023-01-07: qty 2

## 2023-01-07 MED ORDER — IOHEXOL 300 MG/ML  SOLN
100.0000 mL | Freq: Once | INTRAMUSCULAR | Status: AC | PRN
  Administered 2023-01-07: 80 mL via INTRAVENOUS

## 2023-01-07 MED ORDER — CEFPODOXIME PROXETIL 200 MG PO TABS: 200.0000 mg | ORAL_TABLET | Freq: Two times a day (BID) | ORAL | 0 refills | Status: AC

## 2023-01-07 MED ORDER — DOXYCYCLINE MONOHYDRATE 100 MG PO TABS: 100.0000 mg | ORAL_TABLET | Freq: Two times a day (BID) | ORAL | 0 refills | Status: AC

## 2023-01-07 MED ORDER — SODIUM CHLORIDE 0.9 % IV SOLN
1.0000 g | Freq: Once | INTRAVENOUS | Status: AC
  Administered 2023-01-07: 1 g via INTRAVENOUS
  Filled 2023-01-07: qty 10

## 2023-01-07 MED ORDER — DOXYCYCLINE HYCLATE 100 MG PO TABS
100.0000 mg | ORAL_TABLET | Freq: Once | ORAL | Status: DC
  Filled 2023-01-07: qty 1

## 2023-01-07 MED ORDER — DROPERIDOL 2.5 MG/ML IJ SOLN: 2.5000 mg | Freq: Once | INTRAMUSCULAR | Status: DC

## 2023-01-07 MED ORDER — SODIUM CHLORIDE 0.9 % IV BOLUS
1000.0000 mL | Freq: Once | INTRAVENOUS | Status: AC
  Administered 2023-01-07: 1000 mL via INTRAVENOUS

## 2023-01-07 NOTE — ED Notes (Signed)
Pt is very noncompliant with IV insertion and vital checks. Pt attempt to bite this RN, Casey Burkitt, and Terrence Dupont EDT. Pt also attempted to hit and kick as well. Dr Jacelyn Grip informed. Meds ordered to help calm pt.

## 2023-01-07 NOTE — ED Triage Notes (Signed)
Pt via [facility] from [facility]. Pt was at the breakfast table. Pt has no complaint. Facility states that she has a cough, nasal congestion with yellowish mucus, and abd pain starting last night. Pt has increasingly weak, pt has a hx of dementia and can get combative and aggressive.   156/61 BP 60 HR  94% on RA 113 CBG  99.1 temp

## 2023-01-07 NOTE — Discharge Instructions (Signed)
Take antibiotics as prescribed  Thank you for choosing [place] for your health care today!  Please see your primary doctor this week for a follow up appointment.   Sometimes, in the early stages of certain disease courses it is difficult to detect in the emergency department evaluation -- so, it is important that you continue to monitor your symptoms and call your doctor right away or return to the emergency department if you develop any new or worsening symptoms.  Please go to the following website to schedule new (and existing) patient appointments:   [email or web address]  If you do not have a primary doctor try calling the following clinics to establish care:  If you have insurance:  [facility] [phone] [place] [address]   [facility]  [phone] [place] [place] [address]   If you do not have insurance:  [facility]  [phone] [address], [place] [address]   The following is another list of primary care offices in the area who are accepting new patients at this time.  Please reach out to one of them directly and let them know you would like to schedule an appointment to follow up on an Emergency Department visit, and/or to establish a new primary care provider (PCP).  There are likely other primary care clinics in the are who are accepting new patients, but this is an excellent place to start:  [place] physician: Dr Lavon Paganini [address] [place], [place] [address] [phone]  [facility] Lead Physician: Dr Steele Sizer [address], [place], [place] [address] [phone]  [place] Physician: Dr Park Liter [address] [place], [place] [address] [phone]  [facility] Lead Physician: Dr Dewaine Oats [place], [place], [place] [address] [phone]  [place] [facility] [place] Physician: Dr Halina Maidens [address] [place], [place] [address] [phone]   It was my pleasure to care for you today.   Hoover Brunette Jacelyn Grip, MD

## 2023-01-07 NOTE — ED Provider Notes (Signed)
[facility] Provider Note    Event Date/Time   First MD Initiated Contact with Patient 01/07/23 [phone]     (approximate)   History   Cough and Nasal Congestion   HPI  Allison [name] is a 89 y.o. female   Past medical history of dementia, remote history of thyroid cancer who comes from dementia facility with staff concerns of abdominal pain and generalized weakness.  She denies any complaints but history from the patient is limited by her dementia.  That staff reports that the patient complained of lower abdominal pain transiently yesterday and has been weaker than normal and not as participatory as she normally is.  They also note that she has been having a cough and nasal congestion for the last several days but no fever, nausea vomiting diarrhea.  No reports of trauma.  Independent Historian contributed to assessment above: EMS personnel.   I reviewed external medical notes including emergency visit dated October 2023 for similar recurrence of reports of abdominal pain from nursing facility where the patient had no complaints in the emergency department and received a CT scan of the abdomen pelvis as well as basic labs and urinalysis which were negative for acute pathology she was discharged at that time.     Physical Exam   Triage Vital Signs: ED Triage Vitals [01/07/23 0953]  Enc Vitals Group     BP      Pulse      Resp      Temp      Temp src      SpO2      Weight 125 lb (56.7 kg)     Height 5\' 6"  (1.676 m)     Head Circumference      Peak Flow      Pain Score      Pain Loc      Pain Edu?      Excl. in GC?     Most recent vital signs: Vitals:   01/07/23 1300 01/07/23 1330  BP: (!) 182/63 (!) 196/69  Pulse: (!) 51 (!) 52  Resp:    Temp:    SpO2: 100% 97%    General: Awake, no distress.  CV:  Good peripheral perfusion.  Resp:  Normal effort.  Abd:  No distention.  Other:  Disoriented with dementia at baseline, moving all  extremities.  No obvious signs of trauma.  Lungs clear to auscultation bilaterally without focality and wheezing, abdomen soft nontender no rigidity or guarding.  Skin appears warm, slightly dehydrated appearing with dry mucous membranes poor skin turgor.   ED Results / Procedures / Treatments   Labs (all labs ordered are listed, but only abnormal results are displayed) Labs Reviewed  COMPREHENSIVE METABOLIC PANEL - Abnormal; Notable for the following components:      Result Value   Glucose, Bld 105 (*)    BUN 24 (*)    Creatinine, Ser 1.04 (*)    Calcium 8.7 (*)    Alkaline Phosphatase 27 (*)    GFR, Estimated 52 (*)    All other components within normal limits  CBC WITH DIFFERENTIAL/PLATELET - Abnormal; Notable for the following components:   Monocytes Absolute 1.1 (*)    All other components within normal limits  URINALYSIS, COMPLETE (UACMP) WITH MICROSCOPIC - Abnormal; Notable for the following components:   Color, Urine YELLOW (*)    APPearance HAZY (*)    Specific Gravity, Urine 1.038 (*)    Hgb urine  dipstick SMALL (*)    Bacteria, UA RARE (*)    All other components within normal limits  TROPONIN I (HIGH SENSITIVITY) - Abnormal; Notable for the following components:   Troponin I (High Sensitivity) 46 (*)    All other components within normal limits  TROPONIN I (HIGH SENSITIVITY) - Abnormal; Notable for the following components:   Troponin I (High Sensitivity) 43 (*)    All other components within normal limits  RESP PANEL BY RT-PCR (RSV, FLU A&B, COVID)  [id number]  URINE CULTURE  LACTIC ACID, PLASMA  LACTIC ACID, PLASMA     I ordered and reviewed the above labs they are notable for creatinine of 1.04, normal white blood cell count and initial troponin of 46  EKG  ED ECG REPORT I, Lucillie Garfinkel, the attending physician, personally viewed and interpreted this ECG.   Date: 01/07/2023  EKG Time: 0955  Rate: 68  Rhythm: NSR  Axis: nl  Intervals:none  ST&T Change: No  acute ischemic changes    RADIOLOGY I independently reviewed and interpreted straight and see subtle right lung opacities   PROCEDURES:  Critical Care performed: No  Procedures   MEDICATIONS ORDERED IN ED: Medications  cefTRIAXone (ROCEPHIN) 1 g in sodium chloride 0.9 % 100 mL IVPB (has no administration in time range)  doxycycline (VIBRA-TABS) tablet 100 mg (has no administration in time range)  sodium chloride 0.9 % bolus 1,000 mL (0 mLs Intravenous Stopped 01/07/23 1152)  droperidol (INAPSINE) 2.5 MG/ML injection 2.5 mg (2.5 mg Intravenous Given 01/07/23 1009)  iohexol (OMNIPAQUE) 300 MG/ML solution 100 mL (80 mLs Intravenous Contrast Given 01/07/23 1102)     IMPRESSION / MDM / ASSESSMENT AND PLAN / ED COURSE  I reviewed the triage vital signs and the nursing notes.                                Patient's presentation is most consistent with acute presentation with potential threat to life or bodily function.  Differential diagnosis includes, but is not limited to, dehydration or other metabolic derangements, UTI, respiratory infection, intra-abdominal infection or obstruction, ACS, intracranial pathology like bleeding   The patient is on the cardiac monitor to evaluate for evidence of arrhythmia and/or significant heart rate changes.  MDM: This is a patient with broad differential diagnosis with generalized weakness and reports of respiratory infectious symptoms and her report of abdominal pain yesterday.  Getting broad workup including CT scan abdomen pelvis with IV contrast for abdominal pain, CT head to assess for bleeding as patient is altered at baseline and has been less participatory and more altered, as well as infectious workup basic labs UA chest x-ray and viral swabs.  Will give 1 L of normal saline given clinical dehydration on my exam.  She became combative, swatting at providers as they tried to obtain IV access, will administer IM droperidol to facilitate  workup.  Workup significant for sinusitis on imaging, questionable opacity on chest x-ray, bacteria in the urine without inflammatory changes.  She has been hemodynamically stable and without complaints in the emergency department.  Her lactic was normal and her white blood cell count was normal and she has been afebrile.  She is getting quite agitated and this setting.  I considered hospitalization for admission or observation but since she is hemodynamically stable without signs of sepsis and has findings that are amenable for outpatient therapy including questionable infections sinusitis/pneumonia/bacteriuria will give antibiotic  treatment of cefpodoxime and doxycycline to cover for all of the potential infectious etiologies as outpatient and have her follow-up with her PMD and return if there is any worsening.        FINAL CLINICAL IMPRESSION(S) / ED DIAGNOSES   Final diagnoses:  Nasal congestion  Acute sinusitis, recurrence not specified, unspecified location  Bacteriuria     Rx / DC Orders   ED Discharge Orders          Ordered    cefpodoxime (VANTIN) 200 MG tablet  2 times daily        01/07/23 1400    doxycycline (ADOXA) 100 MG tablet  2 times daily        01/07/23 1400             Note:  This document was prepared using Dragon voice recognition software and may include unintentional dictation errors.    Lucillie Garfinkel, MD 01/07/23 [phone]

## 2023-01-07 NOTE — ED Notes (Signed)
This RN, Thedore Mins RN, and Emma NT at bedside to attempt to obtain VS, bloodwork, and EKG. Pt gets increasingly agitated when bothered. Pt is increasingly combative, pt is swinging, kicking, and biting staff. See new orders at this time.

## 2023-01-09 LAB — URINE CULTURE: Culture: >=100000 — AB

## 2023-03-12 IMAGING — DX DG CHEST 1V PORT
1 series · 1 of 1 positions shown · non-contrast
Comparison: 09/26/2008 CT

CLINICAL DATA: Weakness and poor appetite.  Alzheimer's disease.

EXAM:
PORTABLE CHEST 1 VIEW

[chest ap]
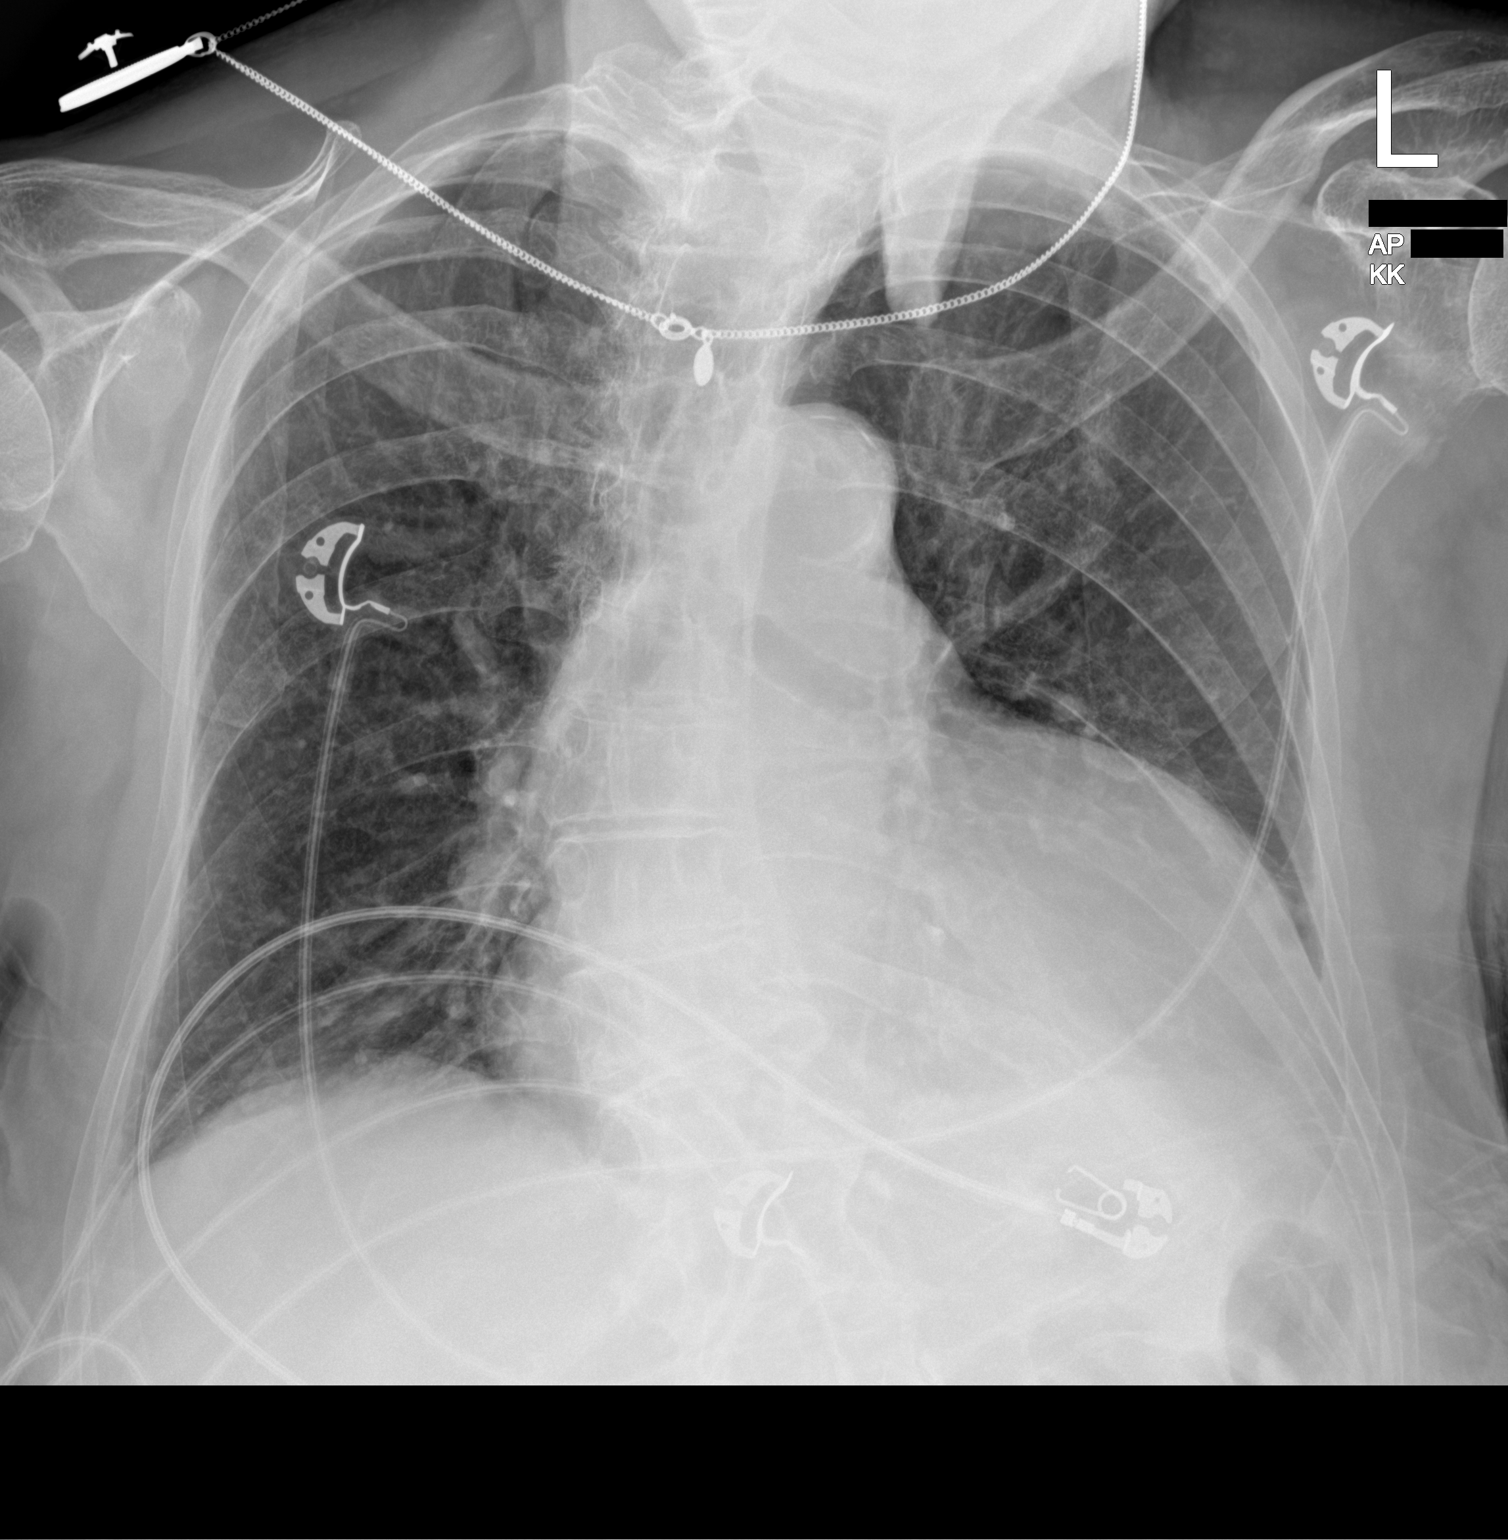

[1 of 1 positions shown; findings below may reference images not displayed]

FINDINGS: Cardiomegaly identified.

Minimal bibasilar atelectasis/scarring noted.

There is no evidence of focal airspace disease, pulmonary edema,
suspicious pulmonary nodule/mass, pleural effusion, or pneumothorax.

No acute bony abnormalities are identified.
IMPRESSION: Cardiomegaly with minimal bibasilar atelectasis/scarring.

## 2023-06-14 IMAGING — CT CT ABD-PELV W/ CM
3 of 5 series · 16 of 46 positions shown, 18 images · IV contrast (APPLIED)
Comparison: None Available.

CLINICAL DATA: Abdominal pain

EXAM:
CT ABDOMEN AND PELVIS WITH CONTRAST
TECHNIQUE: Multidetector CT imaging of the abdomen and pelvis was performed
using the standard protocol following bolus administration of
intravenous contrast.

[Series 2: abdomen 5.0 · axial · 0.82mm/px · z∈[-1227,-877]mm · 11 of 86 slices shown, 13 images]
[im 8/86  soft-tissue]
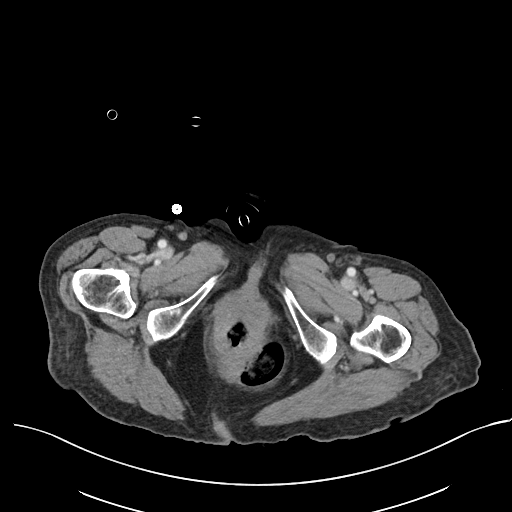
[im 8/86  bone]
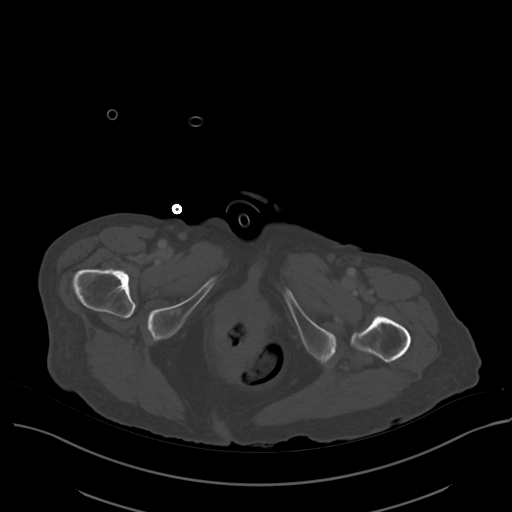
[im 15/86  soft-tissue]
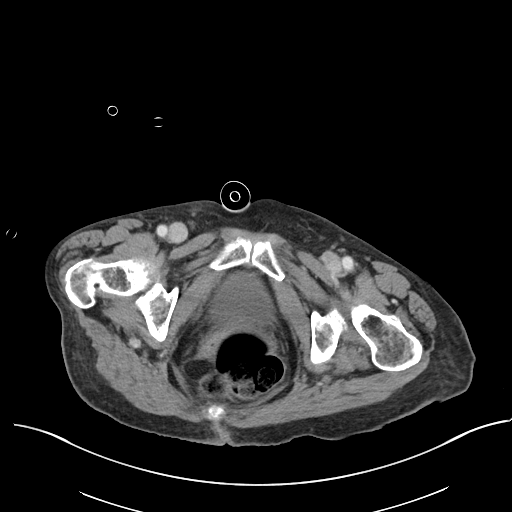
[im 22/86  soft-tissue]
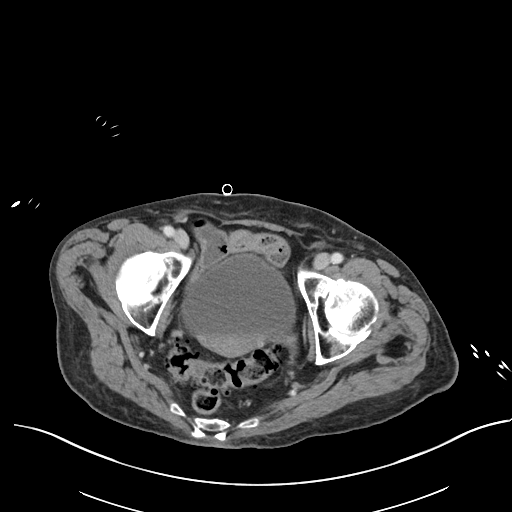
[im 29/86  soft-tissue]
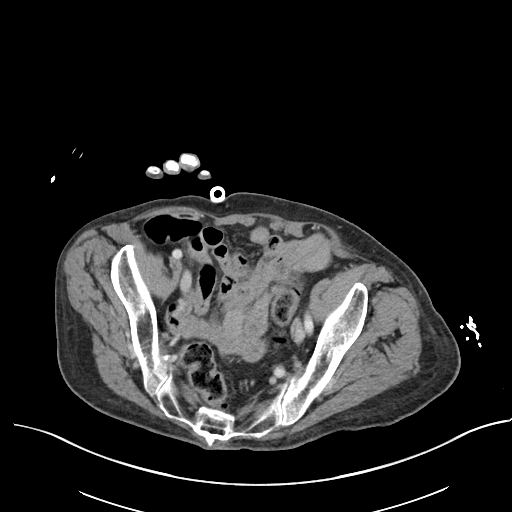
[im 36/86  soft-tissue]
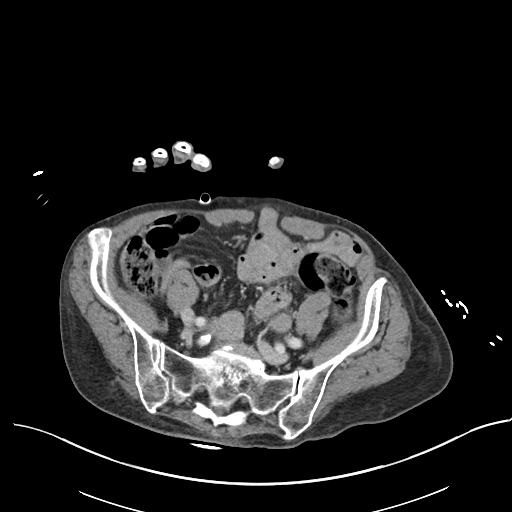
[im 43/86  soft-tissue]
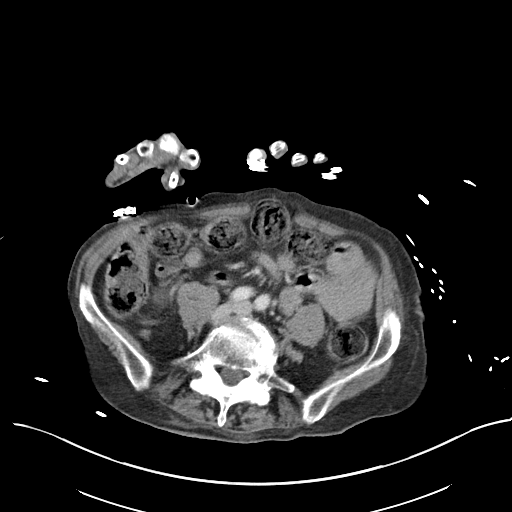
[im 50/86  soft-tissue]
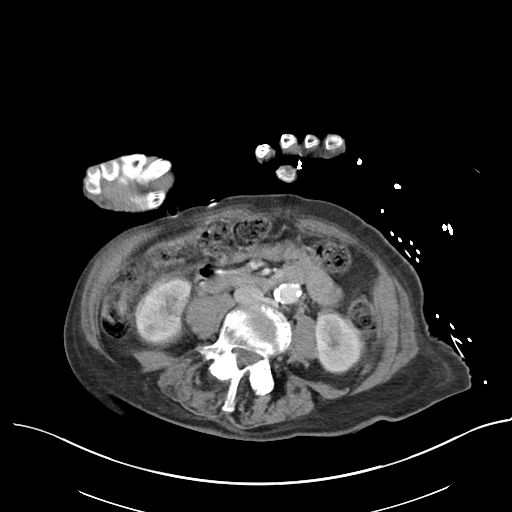
[im 57/86  soft-tissue]
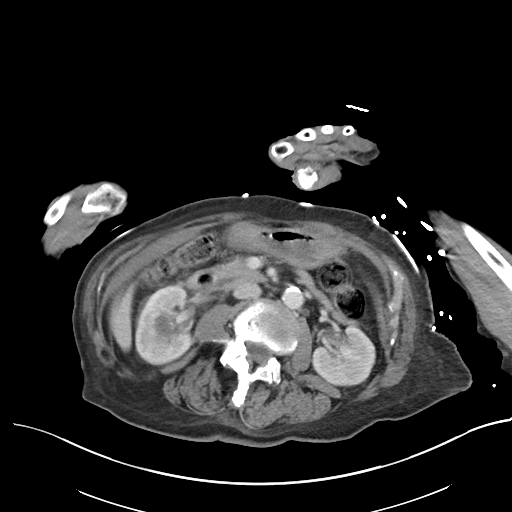
[im 64/86  soft-tissue]
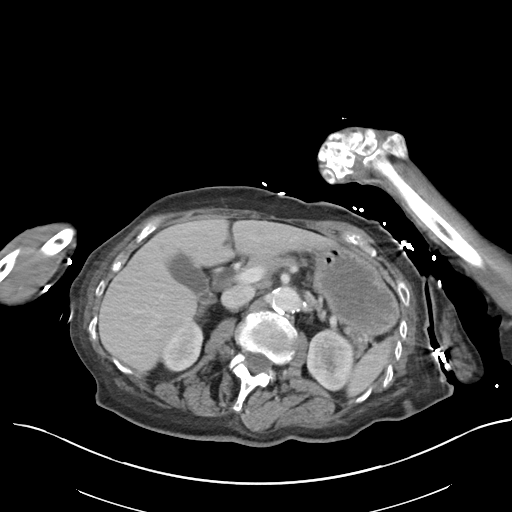
[im 64/86  bone]
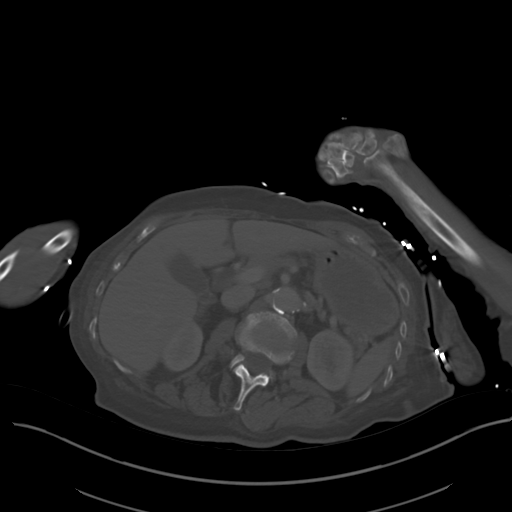
[im 71/86  soft-tissue]
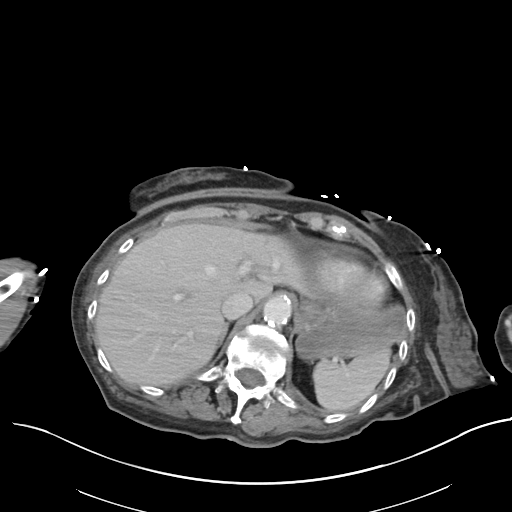
[im 78/86  soft-tissue]
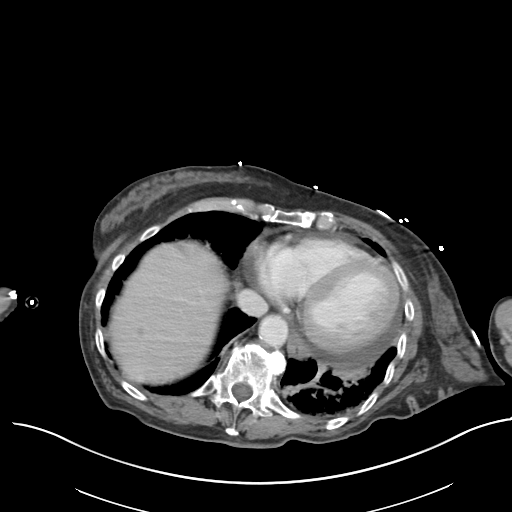

[Series 4: lung · axial · 0.82mm/px · z∈[-1005,-977]mm · 2 of 92 slices shown]
[im 8/92  bone]
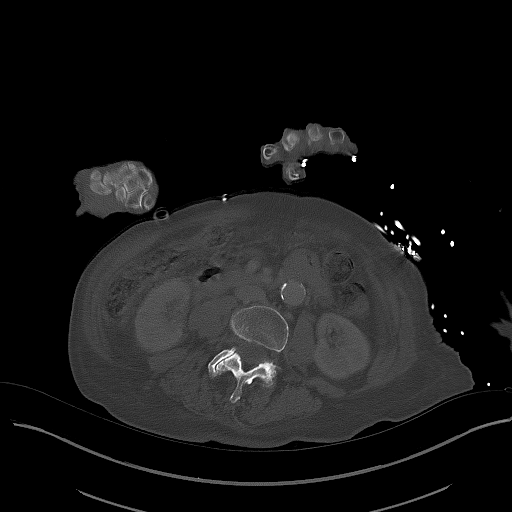
[im 22/92  bone]
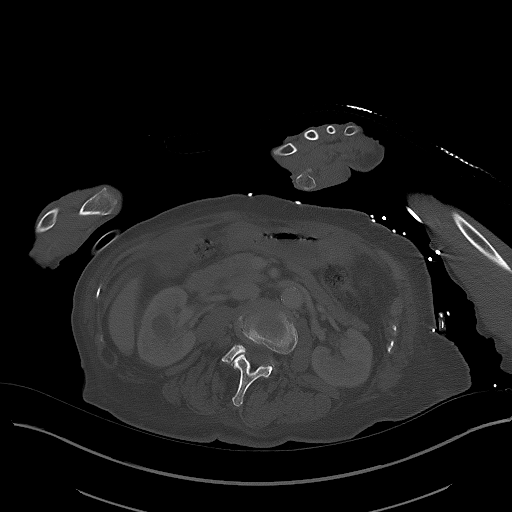

[Series 5: abdomen 3.0 mpr cor · coronal · 0.78mm/px · 3 of 74 slices shown]
[im 25/74  soft-tissue]
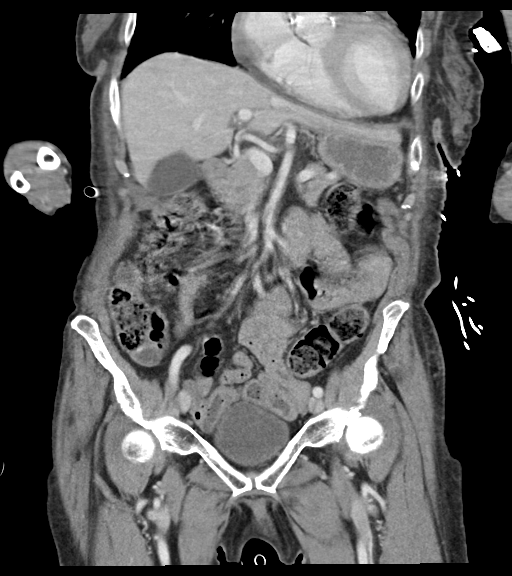
[im 33/74  soft-tissue]
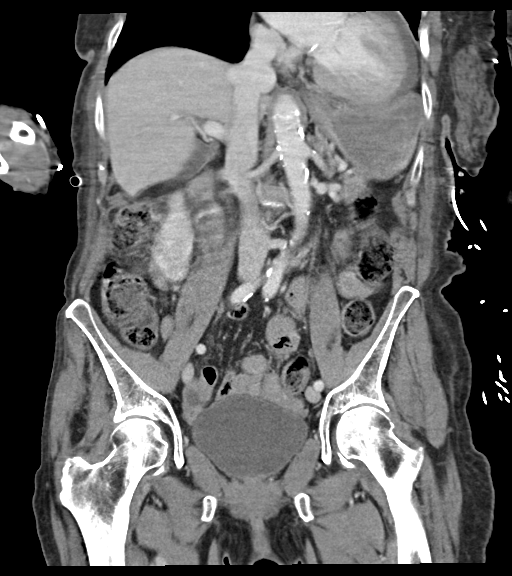
[im 41/74  soft-tissue]
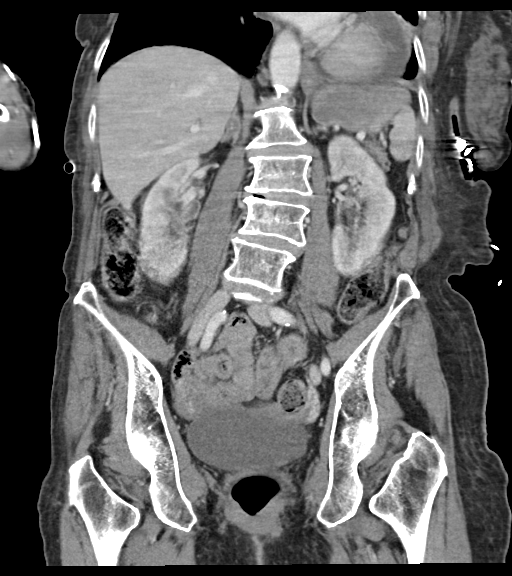

[16 of 46 positions shown; findings below may reference images not displayed]

RADIATION DOSE REDUCTION: This exam was performed according to the
departmental dose-optimization program which includes automated
exposure control, adjustment of the mA and/or kV according to
patient size and/or use of iterative reconstruction technique.

CONTRAST:  75mL OMNIPAQUE IOHEXOL 300 MG/ML  SOLN
FINDINGS: Lower chest: Scarring in the lung bases. Small pericardial effusion.

Hepatobiliary: 7 mm cyst in the right hepatic lobe. No suspicious
focal hepatic abnormality. Gallbladder unremarkable. No biliary
ductal dilatation.

Pancreas: No focal abnormality or ductal dilatation.

Spleen: No focal abnormality.  Normal size.

Adrenals/Urinary Tract: 16 mm right midpole renal cyst. No follow-up
imaging recommended. No stones or hydronephrosis. Adrenal glands and
urinary bladder unremarkable.

Stomach/Bowel: Sigmoid diverticulosis. No active diverticulitis.
Stomach and small bowel decompressed, unremarkable. Normal appendix.

Vascular/Lymphatic: Aortic atherosclerosis. No evidence of aneurysm
or adenopathy.

Reproductive: Uterus and adnexa unremarkable.  No mass.

Other: No free fluid or free air.

Musculoskeletal: No acute bony abnormality.
IMPRESSION: No acute findings in the abdomen or pelvis.

Aortic atherosclerosis.

Bibasilar scarring.  Small pericardial effusion.

## 2023-09-25 ENCOUNTER — Inpatient Hospital Stay
Admission: EM | Admit: 2023-09-25 | Discharge: 2023-12-29 | DRG: 689 | Disposition: A | Discharge status: Skilled Nursing Facility | Payer: Medicare Other | Attending: Student | Admitting: Student

## 2023-09-25 ENCOUNTER — Other Ambulatory Visit: Payer: Self-pay

## 2023-09-25 DIAGNOSIS — E871 Hypo-osmolality and hyponatremia: Secondary | ICD-10-CM | POA: Diagnosis present

## 2023-09-25 DIAGNOSIS — J9811 Atelectasis: Secondary | ICD-10-CM | POA: Diagnosis not present

## 2023-09-25 DIAGNOSIS — N390 Urinary tract infection, site not specified: Principal | ICD-10-CM | POA: Diagnosis present

## 2023-09-25 DIAGNOSIS — B9620 Unspecified Escherichia coli [E. coli] as the cause of diseases classified elsewhere: Secondary | ICD-10-CM | POA: Diagnosis present

## 2023-09-25 DIAGNOSIS — T17928A Food in respiratory tract, part unspecified causing other injury, initial encounter: Secondary | ICD-10-CM | POA: Diagnosis not present

## 2023-09-25 DIAGNOSIS — K5900 Constipation, unspecified: Secondary | ICD-10-CM | POA: Diagnosis present

## 2023-09-25 DIAGNOSIS — R001 Bradycardia, unspecified: Secondary | ICD-10-CM

## 2023-09-25 DIAGNOSIS — E876 Hypokalemia: Secondary | ICD-10-CM | POA: Diagnosis present

## 2023-09-25 DIAGNOSIS — E8809 Other disorders of plasma-protein metabolism, not elsewhere classified: Secondary | ICD-10-CM | POA: Diagnosis present

## 2023-09-25 DIAGNOSIS — R319 Hematuria, unspecified: Secondary | ICD-10-CM | POA: Diagnosis not present

## 2023-09-25 DIAGNOSIS — Z79899 Other long term (current) drug therapy: Secondary | ICD-10-CM

## 2023-09-25 DIAGNOSIS — R627 Adult failure to thrive: Secondary | ICD-10-CM | POA: Diagnosis present

## 2023-09-25 DIAGNOSIS — L89521 Pressure ulcer of left ankle, stage 1: Secondary | ICD-10-CM | POA: Diagnosis present

## 2023-09-25 DIAGNOSIS — D649 Anemia, unspecified: Secondary | ICD-10-CM | POA: Diagnosis not present

## 2023-09-25 DIAGNOSIS — Z66 Do not resuscitate: Secondary | ICD-10-CM | POA: Diagnosis present

## 2023-09-25 DIAGNOSIS — R64 Cachexia: Secondary | ICD-10-CM | POA: Diagnosis present

## 2023-09-25 DIAGNOSIS — B965 Pseudomonas (aeruginosa) (mallei) (pseudomallei) as the cause of diseases classified elsewhere: Secondary | ICD-10-CM | POA: Diagnosis present

## 2023-09-25 DIAGNOSIS — E43 Unspecified severe protein-calorie malnutrition: Secondary | ICD-10-CM | POA: Diagnosis present

## 2023-09-25 DIAGNOSIS — I10 Essential (primary) hypertension: Secondary | ICD-10-CM | POA: Diagnosis present

## 2023-09-25 DIAGNOSIS — Z515 Encounter for palliative care: Secondary | ICD-10-CM

## 2023-09-25 DIAGNOSIS — G9341 Metabolic encephalopathy: Secondary | ICD-10-CM | POA: Diagnosis not present

## 2023-09-25 DIAGNOSIS — N3001 Acute cystitis with hematuria: Secondary | ICD-10-CM | POA: Diagnosis not present

## 2023-09-25 DIAGNOSIS — R638 Other symptoms and signs concerning food and fluid intake: Secondary | ICD-10-CM

## 2023-09-25 DIAGNOSIS — R197 Diarrhea, unspecified: Secondary | ICD-10-CM | POA: Diagnosis not present

## 2023-09-25 DIAGNOSIS — L89892 Pressure ulcer of other site, stage 2: Secondary | ICD-10-CM | POA: Diagnosis present

## 2023-09-25 DIAGNOSIS — L8990 Pressure ulcer of unspecified site, unspecified stage: Secondary | ICD-10-CM | POA: Insufficient documentation

## 2023-09-25 DIAGNOSIS — F03918 Unspecified dementia, unspecified severity, with other behavioral disturbance: Secondary | ICD-10-CM | POA: Diagnosis present

## 2023-09-25 DIAGNOSIS — L89222 Pressure ulcer of left hip, stage 2: Secondary | ICD-10-CM | POA: Diagnosis present

## 2023-09-25 DIAGNOSIS — F0390 Unspecified dementia without behavioral disturbance: Secondary | ICD-10-CM

## 2023-09-25 DIAGNOSIS — Z681 Body mass index (BMI) 19 or less, adult: Secondary | ICD-10-CM

## 2023-09-25 DIAGNOSIS — J45909 Unspecified asthma, uncomplicated: Secondary | ICD-10-CM | POA: Diagnosis present

## 2023-09-25 DIAGNOSIS — R636 Underweight: Secondary | ICD-10-CM

## 2023-09-25 LAB — URINALYSIS, ROUTINE W REFLEX MICROSCOPIC
Bilirubin Urine: NEGATIVE
Glucose, UA: NEGATIVE mg/dL
Ketones, ur: 5 mg/dL — AB
Nitrite: POSITIVE — AB
Protein, ur: 30 mg/dL — AB
Specific Gravity, Urine: 1.016 (ref 1.005–1.030)
pH: 5 (ref 5.0–8.0)

## 2023-09-25 LAB — COMPREHENSIVE METABOLIC PANEL
ALT: 13 U/L (ref 0–44)
AST: 29 U/L (ref 15–41)
Albumin: 3.3 g/dL — ABNORMAL LOW (ref 3.5–5.0)
Alkaline Phosphatase: 45 U/L (ref 38–126)
Anion gap: 18 — ABNORMAL HIGH (ref 5–15)
BUN: 23 mg/dL (ref 8–23)
CO2: 24 mmol/L (ref 22–32)
Calcium: 8.7 mg/dL — ABNORMAL LOW (ref 8.9–10.3)
Chloride: 90 mmol/L — ABNORMAL LOW (ref 98–111)
Creatinine, Ser: 0.84 mg/dL (ref 0.44–1.00)
GFR, Estimated: >60 mL/min (ref >60)
Glucose, Bld: 79 mg/dL (ref 70–99)
Potassium: 3 mmol/L — ABNORMAL LOW (ref 3.5–5.1)
Sodium: 132 mmol/L — ABNORMAL LOW (ref 135–145)
Total Bilirubin: 1.8 mg/dL — ABNORMAL HIGH (ref 0.3–1.2)
Total Protein: 5.8 g/dL — ABNORMAL LOW (ref 6.5–8.1)

## 2023-09-25 LAB — CBC
HCT: 41.5 % (ref 36.0–46.0)
Hemoglobin: 14.6 g/dL (ref 12.0–15.0)
MCH: 32.4 pg (ref 26.0–34.0)
MCHC: 35.2 g/dL (ref 30.0–36.0)
MCV: 92 fL (ref 80.0–100.0)
Platelets: 167 10*3/uL (ref 150–400)
RBC: 4.51 MIL/uL (ref 3.87–5.11)
RDW: 14.2 % (ref 11.5–15.5)
WBC: 8.7 10*3/uL (ref 4.0–10.5)
nRBC: 0 % (ref 0.0–0.2)

## 2023-09-25 MED ORDER — ONDANSETRON HCL 4 MG/2ML IJ SOLN: 4.0000 mg | Freq: Four times a day (QID) | INTRAMUSCULAR | Status: DC | PRN

## 2023-09-25 MED ORDER — ONDANSETRON HCL 4 MG PO TABS
4.0000 mg | ORAL_TABLET | Freq: Four times a day (QID) | ORAL | Status: DC | PRN
  Administered 2023-12-01: 4 mg via ORAL
  Filled 2023-09-25: qty 1

## 2023-09-25 MED ORDER — ACETAMINOPHEN 325 MG PO TABS
650.0000 mg | ORAL_TABLET | Freq: Four times a day (QID) | ORAL | Status: DC | PRN
  Administered 2023-10-30 – 2023-12-25 (×5): 650 mg via ORAL
  Filled 2023-09-25 (×5): qty 2

## 2023-09-25 MED ORDER — SODIUM CHLORIDE 0.9 % IV SOLN
1.0000 g | INTRAVENOUS | Status: DC
  Administered 2023-09-26 – 2023-09-27 (×2): 1 g via INTRAVENOUS
  Filled 2023-09-25 (×3): qty 10

## 2023-09-25 MED ORDER — POTASSIUM CHLORIDE CRYS ER 20 MEQ PO TBCR: 40.0000 meq | EXTENDED_RELEASE_TABLET | Freq: Once | ORAL | Status: DC

## 2023-09-25 MED ORDER — LORAZEPAM 1 MG PO TABS
1.0000 mg | ORAL_TABLET | Freq: Once | ORAL | Status: AC
  Administered 2023-09-25: 1 mg via ORAL
  Filled 2023-09-25: qty 1

## 2023-09-25 MED ORDER — ENOXAPARIN SODIUM 40 MG/0.4ML IJ SOSY
40.0000 mg | PREFILLED_SYRINGE | INTRAMUSCULAR | Status: DC
  Administered 2023-09-26 – 2023-11-25 (×54): 40 mg via SUBCUTANEOUS
  Filled 2023-09-25 (×60): qty 0.4

## 2023-09-25 MED ORDER — ACETAMINOPHEN 650 MG RE SUPP: 650.0000 mg | Freq: Four times a day (QID) | RECTAL | Status: DC | PRN

## 2023-09-25 MED ORDER — POTASSIUM CHLORIDE 10 MEQ/100ML IV SOLN
10.0000 meq | INTRAVENOUS | Status: AC
  Administered 2023-09-26 (×4): 10 meq via INTRAVENOUS
  Filled 2023-09-25 (×4): qty 100

## 2023-09-25 MED ORDER — SODIUM CHLORIDE 0.9 % IV SOLN
1.0000 g | Freq: Once | INTRAVENOUS | Status: AC
  Administered 2023-09-25: 1 g via INTRAVENOUS
  Filled 2023-09-25: qty 10

## 2023-09-25 NOTE — Assessment & Plan Note (Deleted)
Dementia Patient presents with agitation, altered mental status beyond baseline, suspect related to underlying UTI Received Ativan in the ED Delirium precautions Haldol as needed and avoid benzos if possible Patient is an APS case per nurse triage notes [facility] consult

## 2023-09-25 NOTE — H&P (Signed)
History and Physical    Patient: Allison [name]:[id number] DOB: [birth year] DOA: 09/25/2023 DOS: the patient was seen and examined on 09/25/2023 PCP: Alan Mulder, MD  Patient coming from: Home  Chief Complaint:  Chief Complaint  Patient presents with   Failure To Thrive    HPI: Allison [name] is a 89 y.o. female with medical history significant for Dementia, hypertension, previously at [facility], now on home hospice, sent in by hospice via EMS as and APS case.  Hospice staff reported patient has very little oral intake and has not been taking any medications. Patient has baseline dementia with occasional aggression.  Patient did exhibit aggression in the emergency room, hitting out at staff and was administered Ativan.  Spoke with son on the phone Allison [name] who stated that for the past 3 weeks patient has been sleeping most of the day and eating very little.  States she came home from [facility] back in July and she was able to ambulate independently then.  Currently ambulates very little. ED course and Data review: bradycardia in the 50s but otherwise normal vitals. Labs urinalysis consistent with UTI with positive nitrites moderate leuks and many bacteria.  WBC normal. Potassium 3 sodium 132, total bili 1.8 anion gap 18 with normal bicarb of 24 normal blood glucose 79. Started on Rocephin Hospitalist consulted for admission.     Past Medical History:  Diagnosis Date   Anemia    Asthma    Dementia (HCC)    No past surgical history on file. Social History:  reports that she does not currently use alcohol. She reports that she does not currently use drugs. No history on file for tobacco use.  No Known Allergies  No family history on file.  Prior to Admission medications   Not on File    Physical Exam: Vitals:   09/25/23 1408 09/25/23 2034 09/25/23 2100 09/25/23 2112  BP: 110/74 (!) 156/84    Pulse: 86 (!) 54 (!) 58 (!) 53  Resp: 16 19  16   Temp:  97.6 F (36.4 C) 97.7 F (36.5 C)  97.7 F (36.5 C)  TempSrc: Axillary Axillary    SpO2: 95% 99% 100% 99%   Physical Exam Vitals and nursing note reviewed.  Constitutional:      General: She is not in acute distress.    Comments: Somnolent from medication administered in the ED  HENT:     Head: Normocephalic and atraumatic.  Cardiovascular:     Rate and Rhythm: Normal rate and regular rhythm.     Heart sounds: Normal heart sounds.  Pulmonary:     Effort: Pulmonary effort is normal.     Breath sounds: Normal breath sounds.  Abdominal:     Palpations: Abdomen is soft.     Tenderness: There is no abdominal tenderness.     Labs on Admission: I have personally reviewed following labs and imaging studies  CBC: Recent Labs  Lab 09/25/23 2041  WBC 8.7  HGB 14.6  HCT 41.5  MCV 92.0  PLT 167   Basic Metabolic Panel: Recent Labs  Lab 09/25/23 2041  NA 132*  K 3.0*  CL 90*  CO2 24  GLUCOSE 79  BUN 23  CREATININE 0.84  CALCIUM 8.7*   GFR: CrCl cannot be calculated (Unknown ideal weight.). Liver Function Tests: Recent Labs  Lab 09/25/23 2041  AST 29  ALT 13  ALKPHOS 45  BILITOT 1.8*  PROT 5.8*  ALBUMIN 3.3*   No  results for input(s): "LIPASE", "AMYLASE" in the last 168 hours. No results for input(s): "AMMONIA" in the last 168 hours. Coagulation Profile: No results for input(s): "INR", "PROTIME" in the last 168 hours. Cardiac Enzymes: No results for input(s): "CKTOTAL", "CKMB", "CKMBINDEX", "TROPONINI" in the last 168 hours. BNP (last 3 results) No results for input(s): "PROBNP" in the last 8760 hours. HbA1C: No results for input(s): "HGBA1C" in the last 72 hours. CBG: No results for input(s): "GLUCAP" in the last 168 hours. Lipid Profile: No results for input(s): "CHOL", "HDL", "LDLCALC", "TRIG", "CHOLHDL", "LDLDIRECT" in the last 72 hours. Thyroid Function Tests: No results for input(s): "TSH", "T4TOTAL", "FREET4", "T3FREE", "THYROIDAB" in the last 72  hours. Anemia Panel: No results for input(s): "VITAMINB12", "FOLATE", "FERRITIN", "TIBC", "IRON", "RETICCTPCT" in the last 72 hours. Urine analysis:    Component Value Date/Time   COLORURINE AMBER (A) 09/25/2023 2041   APPEARANCEUR CLOUDY (A) 09/25/2023 2041   LABSPEC 1.016 09/25/2023 2041   PHURINE 5.0 09/25/2023 2041   GLUCOSEU NEGATIVE 09/25/2023 2041   HGBUR SMALL (A) 09/25/2023 2041   BILIRUBINUR NEGATIVE 09/25/2023 2041   KETONESUR 5 (A) 09/25/2023 2041   PROTEINUR 30 (A) 09/25/2023 2041   NITRITE POSITIVE (A) 09/25/2023 2041   LEUKOCYTESUR MODERATE (A) 09/25/2023 2041    Radiological Exams on Admission: No results found.   Data Reviewed: Relevant notes from primary care and specialist visits, past discharge summaries as available in EHR, including Care Everywhere. Prior diagnostic testing as pertinent to current admission diagnoses Updated medications and problem lists for reconciliation ED course, including vitals, labs, imaging, treatment and response to treatment Triage notes, nursing and pharmacy notes and ED provider's notes Notable results as noted in HPI   Assessment and Plan: Urinary tract infection Rocephin Follow cultures  Acute metabolic encephalopathy Dementia with behavioral disturbance Patient presents with lethargy, agitation, altered mental status beyond baseline, suspect related to underlying UTI Received Ativan in the ED Delirium precautions Haldol as needed and avoid benzos if possible Patient is an APS case per nurse triage notes TOC consult   Inadequate oral intake Nutritionist consult  Sinus bradycardia Heart rate in the 50s Will get baseline EKG   Hypokalemia Oral repletion Pharmacy consult to monitor and replete     DVT prophylaxis: Lovenox  Consults: none  Advance Care Planning: DNR  Family Communication: Son, Allison [name]  Disposition Plan: Back to previous home environment  Severity of Illness: The appropriate  patient status for this patient is OBSERVATION. Observation status is judged to be reasonable and necessary in order to provide the required intensity of service to ensure the patient's safety. The patient's presenting symptoms, physical exam findings, and initial radiographic and laboratory data in the context of their medical condition is felt to place them at decreased risk for further clinical deterioration. Furthermore, it is anticipated that the patient will be medically stable for discharge from the hospital within 2 midnights of admission.   Author: Andris Baumann, MD 09/25/2023 10:12 PM  For on call review [email or web address].

## 2023-09-25 NOTE — ED Notes (Signed)
Family at bedside and updated on poc.

## 2023-09-25 NOTE — Assessment & Plan Note (Addendum)
Acute cystitis with hematuria.  E. coli and Pseudomonas growing on cultures.  Completed antibiotics on 10/14.

## 2023-09-25 NOTE — Assessment & Plan Note (Addendum)
Oral repletion Pharmacy consult to monitor and replete

## 2023-09-25 NOTE — ED Notes (Signed)
This Rn received a call from APS Supervisor, whom wanted to leave her contact number if anyone needed to reach her over the weekend.  Allison [name] [phone]

## 2023-09-25 NOTE — Assessment & Plan Note (Addendum)
Dementia with behavioral disturbance Patient presents with lethargy, agitation, altered mental status beyond baseline, suspect related to underlying UTI Received Ativan in the ED Delirium precautions Haldol as needed and avoid benzos if possible Patient is an APS case per nurse triage notes [facility] consult

## 2023-09-25 NOTE — ED Notes (Addendum)
Pericare provided to pt. Pt's brief saturated in urine. Pt cooperative with care.

## 2023-09-25 NOTE — ED Notes (Signed)
Per [facility] RN outstanding orders are due level of aggression of pt. Will reattempt when pt has calmed down. Will verify with provider.

## 2023-09-25 NOTE — ED Notes (Signed)
This Rn called Ferdinand Lango, RN executive director of [facility] to get more insight on patient. She stated that she will have an RN call this RN to give an update on pt.

## 2023-09-25 NOTE — ED Triage Notes (Signed)
Pt from home via [facility]. Called out by home hospice for possible APS case. Hospice staff reported pt has not been eating or taking medicines recently. Hx of dementia, pt at baseline cognition. Can have some aggression.

## 2023-09-25 NOTE — ED Notes (Signed)
Informed RN Morrie Sheldon via chat/ pt has bed assigned

## 2023-09-25 NOTE — IPAL (Signed)
  Interdisciplinary Goals of Care Family Meeting   Date carried out: 09/25/2023  Location of the meeting: Phone conference  Member's involved: Physician and Family Member or next of kin  Durable Power of Attorney or acting medical decision maker: Allison [name], son    Discussion: We discussed goals of care for Allison [name] .   I have reviewed medical records including EPIC notes, labs and imaging, assessed the patient and then met with son to discuss major active diagnoses, plan of care, natural trajectory, prognosis, GOC, EOL wishes, disposition and options including Full code/DNI/DNR and the concept of comfort care if DNR is elected. Questions and concerns were addressed. He is in agreement to continue current plan of care . Election for DNR status.   Code status:   Code Status: Do not attempt resuscitation (DNR) PRE-ARREST INTERVENTIONS DESIRED   Disposition: Continue current acute care  Time spent for the meeting: 30    Allison Baumann, MD  09/25/2023, 10:34 PM

## 2023-09-25 NOTE — Assessment & Plan Note (Addendum)
Patient on atenolol.  Will decrease dose down to 25 mg.

## 2023-09-25 NOTE — Assessment & Plan Note (Addendum)
Encouraged eating.  I fed her 2 bites today.

## 2023-09-25 NOTE — ED Notes (Addendum)
Pt cathed, diaper changed, purawick placed, lines changed, mepilex applied to sacrum and R hip over pressure injury, heels floated, pillow support between legs and heels floated. RN visualized scattered bruising on pt and pressure wound on L upper leg.
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
## 2023-09-25 NOTE — ED Notes (Signed)
Pt's Home Hospice RN Wynona Canes [phone]

## 2023-09-26 DIAGNOSIS — B9620 Unspecified Escherichia coli [E. coli] as the cause of diseases classified elsewhere: Secondary | ICD-10-CM | POA: Diagnosis present

## 2023-09-26 DIAGNOSIS — R001 Bradycardia, unspecified: Secondary | ICD-10-CM | POA: Diagnosis present

## 2023-09-26 DIAGNOSIS — J9811 Atelectasis: Secondary | ICD-10-CM | POA: Diagnosis not present

## 2023-09-26 DIAGNOSIS — E871 Hypo-osmolality and hyponatremia: Secondary | ICD-10-CM | POA: Diagnosis present

## 2023-09-26 DIAGNOSIS — K5900 Constipation, unspecified: Secondary | ICD-10-CM | POA: Diagnosis present

## 2023-09-26 DIAGNOSIS — N390 Urinary tract infection, site not specified: Secondary | ICD-10-CM | POA: Diagnosis present

## 2023-09-26 DIAGNOSIS — N3001 Acute cystitis with hematuria: Secondary | ICD-10-CM | POA: Diagnosis present

## 2023-09-26 DIAGNOSIS — L8990 Pressure ulcer of unspecified site, unspecified stage: Secondary | ICD-10-CM | POA: Insufficient documentation

## 2023-09-26 DIAGNOSIS — B965 Pseudomonas (aeruginosa) (mallei) (pseudomallei) as the cause of diseases classified elsewhere: Secondary | ICD-10-CM | POA: Diagnosis present

## 2023-09-26 DIAGNOSIS — N3000 Acute cystitis without hematuria: Secondary | ICD-10-CM

## 2023-09-26 DIAGNOSIS — E43 Unspecified severe protein-calorie malnutrition: Secondary | ICD-10-CM | POA: Diagnosis present

## 2023-09-26 DIAGNOSIS — R197 Diarrhea, unspecified: Secondary | ICD-10-CM | POA: Diagnosis not present

## 2023-09-26 DIAGNOSIS — R627 Adult failure to thrive: Secondary | ICD-10-CM | POA: Diagnosis present

## 2023-09-26 DIAGNOSIS — R319 Hematuria, unspecified: Secondary | ICD-10-CM | POA: Diagnosis not present

## 2023-09-26 DIAGNOSIS — Z515 Encounter for palliative care: Secondary | ICD-10-CM | POA: Diagnosis not present

## 2023-09-26 DIAGNOSIS — E876 Hypokalemia: Secondary | ICD-10-CM | POA: Diagnosis present

## 2023-09-26 DIAGNOSIS — Z79899 Other long term (current) drug therapy: Secondary | ICD-10-CM | POA: Diagnosis not present

## 2023-09-26 DIAGNOSIS — I10 Essential (primary) hypertension: Secondary | ICD-10-CM | POA: Diagnosis present

## 2023-09-26 DIAGNOSIS — Z66 Do not resuscitate: Secondary | ICD-10-CM | POA: Diagnosis present

## 2023-09-26 DIAGNOSIS — G9341 Metabolic encephalopathy: Secondary | ICD-10-CM | POA: Diagnosis present

## 2023-09-26 DIAGNOSIS — F0390 Unspecified dementia without behavioral disturbance: Secondary | ICD-10-CM | POA: Diagnosis not present

## 2023-09-26 DIAGNOSIS — L89222 Pressure ulcer of left hip, stage 2: Secondary | ICD-10-CM | POA: Diagnosis present

## 2023-09-26 DIAGNOSIS — E8809 Other disorders of plasma-protein metabolism, not elsewhere classified: Secondary | ICD-10-CM | POA: Diagnosis present

## 2023-09-26 DIAGNOSIS — L89892 Pressure ulcer of other site, stage 2: Secondary | ICD-10-CM | POA: Diagnosis present

## 2023-09-26 DIAGNOSIS — J45909 Unspecified asthma, uncomplicated: Secondary | ICD-10-CM | POA: Diagnosis present

## 2023-09-26 DIAGNOSIS — F03918 Unspecified dementia, unspecified severity, with other behavioral disturbance: Secondary | ICD-10-CM | POA: Diagnosis present

## 2023-09-26 DIAGNOSIS — R636 Underweight: Secondary | ICD-10-CM | POA: Diagnosis not present

## 2023-09-26 DIAGNOSIS — L89521 Pressure ulcer of left ankle, stage 1: Secondary | ICD-10-CM | POA: Diagnosis present

## 2023-09-26 DIAGNOSIS — D649 Anemia, unspecified: Secondary | ICD-10-CM | POA: Diagnosis not present

## 2023-09-26 DIAGNOSIS — Z681 Body mass index (BMI) 19 or less, adult: Secondary | ICD-10-CM | POA: Diagnosis not present

## 2023-09-26 DIAGNOSIS — R64 Cachexia: Secondary | ICD-10-CM | POA: Diagnosis present

## 2023-09-26 LAB — BASIC METABOLIC PANEL
Anion gap: 14 (ref 5–15)
BUN: 23 mg/dL (ref 8–23)
CO2: 25 mmol/L (ref 22–32)
Calcium: 8.6 mg/dL — ABNORMAL LOW (ref 8.9–10.3)
Chloride: 93 mmol/L — ABNORMAL LOW (ref 98–111)
Creatinine, Ser: 0.81 mg/dL (ref 0.44–1.00)
GFR, Estimated: >60 mL/min (ref >60)
Glucose, Bld: 78 mg/dL (ref 70–99)
Potassium: 3.5 mmol/L (ref 3.5–5.1)
Sodium: 132 mmol/L — ABNORMAL LOW (ref 135–145)

## 2023-09-26 LAB — PHOSPHORUS: Phosphorus: 2.1 mg/dL — ABNORMAL LOW (ref 2.5–4.6)

## 2023-09-26 LAB — MAGNESIUM: Magnesium: 2 mg/dL (ref 1.7–2.4)

## 2023-09-26 MED ORDER — DONEPEZIL HCL 5 MG PO TABS
10.0000 mg | ORAL_TABLET | Freq: Every day | ORAL | Status: DC
  Administered 2023-09-27 – 2023-12-29 (×93): 10 mg via ORAL
  Filled 2023-09-26 (×94): qty 2

## 2023-09-26 MED ORDER — HALOPERIDOL LACTATE 5 MG/ML IJ SOLN: 2.0000 mg | Freq: Four times a day (QID) | INTRAMUSCULAR | Status: DC | PRN

## 2023-09-26 MED ORDER — HYDROCHLOROTHIAZIDE 12.5 MG PO TABS
12.5000 mg | ORAL_TABLET | Freq: Every day | ORAL | Status: DC
  Administered 2023-09-26 – 2023-09-28 (×3): 12.5 mg via ORAL
  Filled 2023-09-26 (×3): qty 1

## 2023-09-26 MED ORDER — LISINOPRIL-HYDROCHLOROTHIAZIDE 20-12.5 MG PO TABS: 1.0000 | ORAL_TABLET | Freq: Every day | ORAL | Status: DC

## 2023-09-26 MED ORDER — FLUOXETINE HCL 10 MG PO CAPS
10.0000 mg | ORAL_CAPSULE | Freq: Every day | ORAL | Status: DC
  Administered 2023-09-27 – 2023-12-29 (×93): 10 mg via ORAL
  Filled 2023-09-26 (×96): qty 1

## 2023-09-26 MED ORDER — LISINOPRIL 20 MG PO TABS
20.0000 mg | ORAL_TABLET | Freq: Every day | ORAL | Status: DC
  Administered 2023-09-26 – 2023-10-03 (×8): 20 mg via ORAL
  Filled 2023-09-26 (×8): qty 1

## 2023-09-26 MED ORDER — POTASSIUM PHOSPHATES 15 MMOLE/5ML IV SOLN
10.0000 mmol | Freq: Once | INTRAVENOUS | Status: AC
  Administered 2023-09-26: 10 mmol via INTRAVENOUS
  Filled 2023-09-26: qty 3.33

## 2023-09-26 MED ORDER — HYDRALAZINE HCL 20 MG/ML IJ SOLN
10.0000 mg | Freq: Four times a day (QID) | INTRAMUSCULAR | Status: DC | PRN
  Administered 2023-09-27 – 2023-10-02 (×3): 10 mg via INTRAVENOUS
  Filled 2023-09-26 (×3): qty 1

## 2023-09-26 MED ORDER — LEVOTHYROXINE SODIUM 100 MCG PO TABS
100.0000 ug | ORAL_TABLET | Freq: Every day | ORAL | Status: DC
  Administered 2023-09-27 – 2023-12-29 (×89): 100 ug via ORAL
  Filled 2023-09-26 (×93): qty 1

## 2023-09-26 NOTE — Progress Notes (Addendum)
  PROGRESS NOTE    Allison [name]  JWJ:[id number] DOB: 10-29-1932 DOA: 09/25/2023 PCP: Alan Mulder, MD  [id number]/[id number]-AA  LOS: 0 days   Brief hospital course:   Assessment & Plan: Allison [name] is a 89 y.o. female with medical history significant for Dementia, hypertension, previously at [facility], now on home hospice, sent in by hospice via EMS as APS case.    Hospice staff reported patient has very little oral intake and has not been taking any medications. Patient has baseline dementia with occasional aggression.  Patient did exhibit aggression in the emergency room, hitting out at staff and was administered Ativan.  Spoke with son on the phone Allison [name] who stated that for the past 3 weeks patient has been sleeping most of the day and eating very little.  States she came home from [place] back in July and she was able to ambulate independently then.  Currently ambulates very little.    * Urinary tract infection --UA with pos nitrite, mod leuk and many bacteria. --cont ceftriaxone pending urine cx results  Acute metabolic encephalopathy Dementia with behavioral disturbance Patient presents with lethargy, agitation, altered mental status beyond baseline, possibly related to underlying UTI Received Ativan in the ED Plan: --Haldol PRN --avoid benzos  Inadequate oral intake  Sinus bradycardia Heart rate in the 50s --hold home atenolol  HTN --BP trending up --resume home Zestoretic --clonidine patch listed on home med, however, nursing did not see a patch on pt. --IV hydralazine PRN  Hypokalemia Pharmacy consult to monitor and supplement   DVT prophylaxis: Lovenox SQ Code Status: DNR  Family Communication:  Level of care: Med-Surg Dispo:   The patient is from: home Anticipated d/c is to: to be determined Anticipated d/c date is: to be determined Patient currently is not medically ready to d/c due to: tx for UTI.    Subjective and Interval  History:  Pt did not respond to questions or commands   Objective: Vitals:   09/26/23 0546 09/26/23 0857 09/26/23 1134 09/26/23 1509  BP:  (!) 165/123 (!) 156/75 (!) 179/72  Pulse:  (!) 53 (!) 59 97  Resp:  20 18 20   Temp:  98.8 F (37.1 C) 98.4 F (36.9 C) (!) 97.5 F (36.4 C)  TempSrc:  Oral Oral Oral  SpO2:  96% 96% 94%  Weight: 43.7 kg       Intake/Output Summary (Last 24 hours) at 09/26/2023 1759 Last data filed at 09/26/2023 1017 Gross per 24 hour  Intake 443.33 ml  Output 300 ml  Net 143.33 ml   Filed Weights   09/26/23 0546  Weight: 43.7 kg    Examination:   Constitutional: NAD CV: No cyanosis.   RESP: normal respiratory effort, on RA Extremities: No effusions, edema in BLE SKIN: warm, dry   Data Reviewed: I have personally reviewed labs and imaging studies  Time spent: 50 minutes  Allison Priestly, MD [facility] If 7PM-7AM, please contact night-coverage 09/26/2023, 5:59 PM

## 2023-09-26 NOTE — Plan of Care (Signed)
  Problem: Education: Goal: Knowledge of General Education information will improve Description: Including pain rating scale, medication(s)/side effects and non-pharmacologic comfort measures Outcome: Progressing   Problem: Health Behavior/Discharge Planning: Goal: Ability to manage health-related needs will improve Outcome: Progressing   Problem: Clinical Measurements: Goal: Will remain free from infection Outcome: Progressing   Problem: Clinical Measurements: Goal: Respiratory complications will improve Outcome: Progressing   

## 2023-09-26 NOTE — Plan of Care (Signed)

## 2023-09-26 NOTE — ED Notes (Signed)
Informed RN Morrie Sheldon) via chat. Pt has bed assigned

## 2023-09-26 NOTE — Progress Notes (Signed)
PHARMACY CONSULT NOTE - FOLLOW UP  Pharmacy Consult for Electrolyte Monitoring and Replacement   Recent Labs: Potassium (mmol/L)  Date Value  09/26/2023 3.5   Magnesium (mg/dL)  Date Value  16/09/9603 2.0   Calcium (mg/dL)  Date Value  54/08/8118 8.6 (L)   Albumin (g/dL)  Date Value  14/78/2956 3.3 (L)   Phosphorus (mg/dL)  Date Value  21/30/8657 2.1 (L)   Sodium (mmol/L)  Date Value  09/26/2023 132 (L)   Corrected Ca:9.2  Assessment: 89 yo F with failure to thrive.  Hx Dementia, hypertension, asthma, on home hospice with very little oral intake. Per consult order, pt is unable to swallow pills.  Goal of Therapy:  Electrolytes WNL  Plan:  -Phos 2.1 , K 3.5    Will order Potassium Phosphate 10 mmol IV x 1    (weight= 43.7 kg) F/u electrolytes with am labs   Angelique Blonder ,PharmD Clinical Pharmacist 09/26/2023 9:15 AM

## 2023-09-27 DIAGNOSIS — G9341 Metabolic encephalopathy: Secondary | ICD-10-CM | POA: Diagnosis not present

## 2023-09-27 DIAGNOSIS — N390 Urinary tract infection, site not specified: Secondary | ICD-10-CM

## 2023-09-27 DIAGNOSIS — E876 Hypokalemia: Secondary | ICD-10-CM | POA: Diagnosis not present

## 2023-09-27 DIAGNOSIS — F03918 Unspecified dementia, unspecified severity, with other behavioral disturbance: Secondary | ICD-10-CM | POA: Diagnosis not present

## 2023-09-27 DIAGNOSIS — R319 Hematuria, unspecified: Secondary | ICD-10-CM

## 2023-09-27 MED ORDER — K PHOS MONO-SOD PHOS DI & MONO 155-852-130 MG PO TABS
250.0000 mg | ORAL_TABLET | Freq: Two times a day (BID) | ORAL | Status: AC
  Administered 2023-09-27 (×2): 250 mg via ORAL
  Filled 2023-09-27 (×2): qty 1

## 2023-09-27 NOTE — Evaluation (Signed)
Occupational Therapy Evaluation Patient Details Name: Allison [name] MRN: [id number] DOB: 01-17-1932 Today's Date: 09/27/2023   History of Present Illness Pt is an 89 yo female that presented to the ED for poor intake, and not taking any medication (sent to [name] via hospice services at home). Workup for UTI. PMH of dementia, HTN.   Clinical Impression   Pt seen today for OT/PT co-eval for pt and therapist safety. Pt lethargic, opens eyes twice, alert only to first name "[name]", and generally unable to follow 1 - step commands. Lethargic,  cannot provide details of PLOF. Chart review indicates pt receiving hospice services and lives at home with her son.   Pt currently requiring TOTAL A x 2 for bed mobility supine <> sit, attempting to hit OT/PT during transition (redirects with handheld assist), sits EOB briefly with poor sitting balance and TOTAL A, spontaneously returning to supine. Will require TOTAL A for ADLs and 24/7 support at [name] discharge. OT will sign off due to pt's inability to participate with OT services for functional progression. Repositioned, needs within reach. Please re- consult if pt's status changes or acute needs arise.       If plan is discharge home, recommend the following: Two people to help with walking and/or transfers;Two people to help with bathing/dressing/bathroom;Assist for transportation;Supervision due to cognitive status;Assistance with cooking/housework;Assistance with feeding;Direct supervision/assist for medications management;Direct supervision/assist for financial management;Help with stairs or ramp for entrance    Functional Status Assessment  Patient has not had a recent decline in their functional status  Equipment Recommendations  None recommended by OT    Recommendations for Other Services Other (comment)     Precautions / Restrictions Precautions Precautions: Fall Restrictions Weight Bearing Restrictions: No      Mobility Bed  Mobility Overal bed mobility: Needs Assistance Bed Mobility: Supine to Sit, Sit to Supine     Supine to sit: +2 for physical assistance, Total assist Sit to supine: Total assist, +2 for physical assistance        Transfers                          Balance Overall balance assessment: Needs assistance Sitting-balance support: Bilateral upper extremity supported Sitting balance-Leahy Scale: Poor Sitting balance - Comments: does not maintain sitting >2 minutes. R lean and required constant min-modA to remain sitting. spontaneously returns to supine                                   ADL either performed or assessed with clinical judgement   ADL Overall ADL's : At baseline;Needs assistance/impaired     Grooming: Wash/dry hands;Wash/dry face;Total assistance;Bed level                               Functional mobility during ADLs: +2 for physical assistance General ADL Comments: Pt unable to follow simple commands; cannot complete task with Allison [name] assist. Attempts to bite washcloth; raises hand to face 1x, but then tries to wash OT's hand. Pt likely at baseline for ADLs, TOTAL A required\      Pertinent Vitals/Pain Pain Assessment Pain Assessment: Faces Faces Pain Scale: Hurts even more Pain Location: with bed mobility, BLE movement Pain Descriptors / Indicators: Grimacing, Guarding, Aching Pain Intervention(s): Limited activity within patient's tolerance     Extremity/Trunk Assessment Upper Extremity Assessment  Upper Extremity Assessment: Generalized weakness (attempted to wash face, pt unable to perform with cues and HOH assist)   Lower Extremity Assessment Lower Extremity Assessment: Generalized weakness   Cervical / Trunk Assessment Cervical / Trunk Assessment: Kyphotic   Communication Communication Communication: Difficulty following commands/understanding Cueing Techniques:  (does not follow commands)   Cognition Arousal:  Lethargic Behavior During Therapy: Flat affect Overall Cognitive Status: No family/caregiver present to determine baseline cognitive functioning                                 General Comments: pt able to say her name "Allison [name]" but did not clearly verbalize anything else. unable to follow commands                Home Living Family/patient expects to be discharged to:: Private residence Living Arrangements: Children                               Additional Comments: Pt unable to provide PLOF, per chart from home with son and minimally ambulatory, recieving hospice services      Prior Functioning/Environment Prior Level of Function : Patient poor historian/Family not available (Chart review indicates pt on hospice)                                        Co-evaluation   Reason for Co-Treatment: Necessary to address cognition/behavior during functional activity;For patient/therapist safety PT goals addressed during session: Mobility/safety with mobility;Balance OT goals addressed during session: ADL's and self-care      AM-PAC OT "6 Clicks" Daily Activity     Outcome Measure Help from another person eating meals?: Total Help from another person taking care of personal grooming?: Total Help from another person toileting, which includes using toliet, bedpan, or urinal?: Total Help from another person bathing (including washing, rinsing, drying)?: Total Help from another person to put on and taking off regular upper body clothing?: Total Help from another person to put on and taking off regular lower body clothing?: Total 6 Click Score: 6   End of Session Equipment Utilized During Treatment: Other (comment) Nurse Communication: Other (comment)  Activity Tolerance: Patient limited by lethargy Patient left: in bed;with call bell/phone within reach;with bed alarm set                   Time: 0955-1005 OT Time Calculation (min): 10  min Charges:  OT General Charges $OT Visit: 1 Visit OT Evaluation $OT Eval Low Complexity: 1 Low  Milee Qualls L. Taraya Steward, OTR/L  09/27/23, 12:35 PM

## 2023-09-27 NOTE — Progress Notes (Signed)
[facility]  - [place] at [facility]   PATIENT NAME: Allison [name]    MR#:  [id number]  DATE OF BIRTH:  [birth year]  SUBJECTIVE:  no family at bedside. Patient not able to answer most of the questions. She is awake tells me she is hungry. Has dementia at baseline.    VITALS:  Blood pressure (!) 127/98, pulse 71, temperature 98 F (36.7 C), resp. rate 20, weight 43.7 kg, SpO2 99%.  PHYSICAL EXAMINATION:  Constitutional: NAD CV: No cyanosis.   RESP: normal respiratory effort, on RA Extremities: No effusions, edema in BLE SKIN: warm, dry Neuro--non focal, alert. Dementia at baseline  LABORATORY PANEL:  CBC Recent Labs  Lab 09/25/23 2041  WBC 8.7  HGB 14.6  HCT 41.5  PLT 167    Chemistries  Recent Labs  Lab 09/25/23 2041 09/26/23 0552  NA 132* 132*  K 3.0* 3.5  CL 90* 93*  CO2 24 25  GLUCOSE 79 78  BUN 23 23  CREATININE 0.84 0.81  CALCIUM 8.7* 8.6*  MG  --  2.0  AST 29  --   ALT 13  --   ALKPHOS 45  --   BILITOT 1.8*  --    Assessment and Plan Allison [name] is a 89 y.o. female with medical history significant for Dementia, hypertension, previously at [facility], now on home hospice, sent in by hospice via EMS as APS case.     Hospice staff reported patient has very little oral intake and has not been taking any medications. Patient has baseline dementia with occasional aggression.  Patient did exhibit aggression in the emergency room, hitting out at staff and was administered Ativan.  Spoke with son on the phone Allison [name] who stated that for the past 3 weeks patient has been sleeping most of the day and eating very little.  States she came home from [place] back in July and she was able to ambulate independently then.  Currently ambulates very little.     Urinary tract infection --UA with pos nitrite, mod leuk and many bacteria. --cont ceftriaxone pending urine cx results   Acute metabolic encephalopathy Dementia with  behavioral disturbance Patient presents with lethargy, agitation, altered mental status beyond baseline, possibly related to underlying UTI Received Ativan in the ED Plan: --Haldol PRN --avoid benzos   Inadequate oral intake   Sinus bradycardia Heart rate in the 50s--now improved to 60-71 --hold home atenolol   HTN --BP trending up --resume home Zestoretic --clonidine patch listed on home med, however, nursing did not see a patch on pt. --IV hydralazine PRN   Hypokalemia Pharmacy consult to monitor and supplement     DVT prophylaxis: Lovenox SQ Code Status: DNR  Family Communication:  Level of care: Med-Surg Dispo:   The patient is from: home Anticipated d/c is to: to be determined Anticipated d/c date is: to be determined Patient currently is not medically ready to d/c due to: tx for UTI.          TOTAL TIME TAKING CARE OF THIS PATIENT: 35 minutes.  >50% time spent on counselling and coordination of care  Note: This dictation was prepared with Dragon dictation along with smaller phrase technology. Any transcriptional errors that result from this process are unintentional.  Enedina Finner M.D    [facility]   CC: Primary care physician; Alan Mulder, MD

## 2023-09-27 NOTE — Plan of Care (Signed)
  Problem: Health Behavior/Discharge Planning: Goal: Ability to manage health-related needs will improve Outcome: Progressing   Problem: Clinical Measurements: Goal: Will remain free from infection Outcome: Progressing Goal: Respiratory complications will improve Outcome: Progressing Goal: Cardiovascular complication will be avoided Outcome: Progressing   

## 2023-09-27 NOTE — Evaluation (Signed)
Physical Therapy Evaluation Patient Details Name: Allison [name] MRN: [id number] DOB: 03-Dec-1932 Today's Date: 09/27/2023  History of Present Illness  Pt is an 89 yo female that presented to the ED for poor intake, and not taking any medication (sent to hospital via hospice services at home). Workup for UTI. PMH of dementia, HTN.   Clinical Impression  Pt lethargic throughout session, opens her eyes twice. Unable to truly follow any commands or answer PLOF questions. Per chart pt is from home with her son, minimally ambulatory. Did say her name once during session, a few other words noted but unable to hear her. totalAx2 to sit EOB, pt noted to attempt to hit OT/PT during transfer but somewhat redirectable (handheld assist prevented further attempts). Unable to sit >2 minutes and required min-modA to maintain sitting, pt spontaneously returned to supine. Repositioned with needs in reach. PT to sign off due to pt's inability to participate meaningfully with PT services. Please reconsult PT if pt status changes or acute needs are identified.          If plan is discharge home, recommend the following: Two people to help with walking and/or transfers;Two people to help with bathing/dressing/bathroom;Direct supervision/assist for medications management;Help with stairs or ramp for entrance;Assist for transportation;Assistance with feeding;Direct supervision/assist for financial management;Assistance with cooking/housework;Supervision due to cognitive status   Can travel by private vehicle        Equipment Recommendations Other (comment) (if pt is to return home with 24/7 supervision/assistance, recommend hoyer lift, hospital bed, WC)  Recommendations for Other Services       Functional Status Assessment       Precautions / Restrictions Precautions Precautions: Fall Restrictions Weight Bearing Restrictions: No      Mobility  Bed Mobility Overal bed mobility: Needs Assistance Bed  Mobility: Supine to Sit, Sit to Supine     Supine to sit: +2 for physical assistance, Total assist Sit to supine: Total assist, +2 for physical assistance        Transfers                        Ambulation/Gait                  Stairs            Wheelchair Mobility     Tilt Bed    Modified Rankin (Stroke Patients Only)       Balance Overall balance assessment: Needs assistance Sitting-balance support: Bilateral upper extremity supported Sitting balance-Leahy Scale: Poor Sitting balance - Comments: does not maintain sitting >2 minutes. R lean and required constant min-modA to remain sitting. spontaneously returns to supine                                     Pertinent Vitals/Pain Pain Assessment Pain Assessment: Faces Faces Pain Scale: Hurts even more Pain Location: with bed mobility, BLE movement Pain Descriptors / Indicators: Grimacing, Guarding, Aching Pain Intervention(s): Limited activity within patient's tolerance, Monitored during session, Repositioned    Home Living Family/patient expects to be discharged to:: Private residence Living Arrangements: Children                 Additional Comments: Pt unable to provide PLOF, per chart from home with son and minimally ambulatory, recieving hospice services    Prior Function Prior Level of Function : Patient poor historian/Family not available (  Chart review indicates pt on hospice)                     Extremity/Trunk Assessment   Upper Extremity Assessment Upper Extremity Assessment: Generalized weakness (attempted to wash face, pt unable to perform with cues and HOH assist)    Lower Extremity Assessment Lower Extremity Assessment: Generalized weakness    Cervical / Trunk Assessment Cervical / Trunk Assessment: Kyphotic  Communication   Communication Communication: Difficulty following commands/understanding Cueing Techniques:  (does not follow  commands)  Cognition Arousal: Lethargic Behavior During Therapy: Flat affect Overall Cognitive Status: No family/caregiver present to determine baseline cognitive functioning                                 General Comments: pt able to say her name "Allison Bible" but did not clearly verbalize anything else. unable to follow commands        General Comments      Exercises     Assessment/Plan    PT Assessment Patient does not need any further PT services  PT Problem List         PT Treatment Interventions      PT Goals (Current goals can be found in the Care Plan section)       Frequency       Co-evaluation PT/OT/SLP Co-Evaluation/Treatment: Yes Reason for Co-Treatment: Necessary to address cognition/behavior during functional activity;For patient/therapist safety PT goals addressed during session: Mobility/safety with mobility;Balance OT goals addressed during session: ADL's and self-care       AM-PAC PT "6 Clicks" Mobility  Outcome Measure Help needed turning from your back to your side while in a flat bed without using bedrails?: Total Help needed moving from lying on your back to sitting on the side of a flat bed without using bedrails?: Total Help needed moving to and from a bed to a chair (including a wheelchair)?: Total Help needed standing up from a chair using your arms (e.g., wheelchair or bedside chair)?: Total Help needed to walk in hospital room?: Total Help needed climbing 3-5 steps with a railing? : Total 6 Click Score: 6    End of Session   Activity Tolerance: Patient limited by lethargy Patient left: in bed;with call bell/phone within reach;with bed alarm set   PT Visit Diagnosis: Other abnormalities of gait and mobility (R26.89)    Time: 0955-1005 PT Time Calculation (min) (ACUTE ONLY): 10 min   Charges:   PT Evaluation $PT Eval Low Complexity: 1 Low   PT General Charges $$ ACUTE PT VISIT: 1 Visit         Olga Coaster PT,  DPT 12:31 PM,09/27/23

## 2023-09-28 MED ORDER — CIPROFLOXACIN HCL 500 MG PO TABS
500.0000 mg | ORAL_TABLET | Freq: Two times a day (BID) | ORAL | Status: DC
  Administered 2023-09-28 – 2023-10-01 (×7): 500 mg via ORAL
  Filled 2023-09-28 (×8): qty 1

## 2023-09-28 MED ORDER — ENSURE ENLIVE PO LIQD
237.0000 mL | Freq: Two times a day (BID) | ORAL | Status: DC
  Administered 2023-09-29 – 2023-12-29 (×141): 237 mL via ORAL

## 2023-09-28 NOTE — Progress Notes (Signed)
Civil engineer, contracting [facility]  [facility])  Referral  New referral for evaluation of Hospice Inpatient Unit redeived from [facility], SW/TOC.    Patient evaluated and does not meet criteria for IPU.  No unmanaged symptoms and patient is not imminent end of life.  We can serve patient at LTC or in a home care setting.  Per Darolyn Rua, she would like for me to hold off on further referral at this time as patient is a ward of the state and has DSS guardian.  Thank you for allowing participation on this patient's care.  Norris Cross, RN Nurse Liaison [phone]

## 2023-09-28 NOTE — Progress Notes (Signed)
[facility]  - [place] at [facility]   PATIENT NAME: Allison [name]    MR#:  [id number]  DATE OF BIRTH:  12-28-1931  SUBJECTIVE:  no family at bedside. Patient not able to answer most of the questions.  Has dementia at baseline. She has been eating up to 20 to 25% of the meals per RN    VITALS:  Blood pressure (!) 156/73, pulse 72, temperature 97.7 F (36.5 C), temperature source Oral, resp. rate 18, weight 43.7 kg, SpO2 100%.  PHYSICAL EXAMINATION:   CV: S1 S2 normal RESP: normal respiratory effort, on RA Extremities: No effusions, edema in BLE SKIN: warm, dry Neuro--non focal, alert. Dementia at baseline  LABORATORY PANEL:  CBC Recent Labs  Lab 09/25/23 2041  WBC 8.7  HGB 14.6  HCT 41.5  PLT 167    Chemistries  Recent Labs  Lab 09/25/23 2041 09/26/23 0552  NA 132* 132*  K 3.0* 3.5  CL 90* 93*  CO2 24 25  GLUCOSE 79 78  BUN 23 23  CREATININE 0.84 0.81  CALCIUM 8.7* 8.6*  MG  --  2.0  AST 29  --   ALT 13  --   ALKPHOS 45  --   BILITOT 1.8*  --    Assessment and Plan KRISINDA GIOVANNI is a 89 y.o. female with medical history significant for Dementia, hypertension, previously at [facility], now on home hospice, sent in by hospice via EMS as APS case.     Hospice staff reported patient has very little oral intake and has not been taking any medications. Patient has baseline dementia with occasional aggression.  Patient did exhibit aggression in the emergency room, hitting out at staff and was administered Ativan.  Spoke with son on the phone Allison [name] who stated that for the past 3 weeks patient has been sleeping most of the day and eating very little.  States she came home from [place] back in July and she was able to ambulate independently then.  Currently ambulates very little.     Urinary tract infection --UA with pos nitrite, mod leuk and many bacteria. -- Urine culture has E. coli and Pseudomonas--- change to PO Cipro    acute metabolic encephalopathy Dementia with behavioral disturbance Patient presents with lethargy, agitation, altered mental status beyond baseline, possibly related to underlying UTI --Haldol PRN --avoid benzos   Inadequate oral intake   Sinus bradycardia Heart rate in the 50s--now improved to 60-71 --hold home atenolol   HTN --BP trending up --resume home Zestoretic --clonidine patch listed on home med, however, nursing did not see a patch on pt. --IV hydralazine PRN   Hypokalemia Pharmacy consult to monitor and supplement     DVT prophylaxis: Lovenox SQ Code Status: DNR  Family Communication:  Level of care: Med-Surg Dispo:   The patient is from: home Anticipated d/c is to: to be determined Anticipated d/c date is: to be determined Patient currently is not medically ready to d/c due to: tx for UTI.    Patient is under DSS. Discharge planning per TOC. Medically best at baseline      TOTAL TIME TAKING CARE OF THIS PATIENT: 35 minutes.  >50% time spent on counselling and coordination of care  Note: This dictation was prepared with Dragon dictation along with smaller phrase technology. Any transcriptional errors that result from this process are unintentional.  Enedina Finner M.D    [facility]   CC: Primary care physician; Alan Mulder, MD

## 2023-09-28 NOTE — TOC Initial Note (Signed)
Transition of Care [facility]) - Initial/Assessment Note    Patient Details  Name: Allison [name] MRN: [id number] Date of Birth: [birth year]  [facility]) CM/SW Contact:    Darolyn Rua, LCSW Phone Number: 09/28/2023, 12:53 PM  Clinical Narrative:                  CSW spoke with DSS Sharlyne Cai who reports that they are seeking guardianship with no court date. She reports they would like to see if patient qualifies for hospice home.   CSW spoke with [facility]  at [phone] who reports they discharged patient due to her being inpatient and home situation   CSW made referral to [place] with [facility] who will review for hospice home appropriateness.     Expected Discharge Plan:  (TBD) Barriers to Discharge: Continued Medical Work up   Patient Goals and CMS Choice Patient states their goals for this hospitalization and ongoing recovery are:: to go home CMS Medicare.gov Compare Post Acute Care list provided to:: Patient Choice offered to / list presented to : Patient      Expected Discharge Plan and Services       Living arrangements for the past 2 months: Single Family Home                                      Prior Living Arrangements/Services Living arrangements for the past 2 months: Single Family Home Lives with:: Relatives                   Activities of Daily Living   ADL Screening (condition at time of admission) Independently performs ADLs?: No Does the patient have a NEW difficulty with bathing/dressing/toileting/self-feeding that is expected to last >3 days?: No Does the patient have a NEW difficulty with getting in/out of bed, walking, or climbing stairs that is expected to last >3 days?: No Does the patient have a NEW difficulty with communication that is expected to last >3 days?: No Is the patient deaf or have difficulty hearing?: No Does the patient have difficulty seeing, even when wearing glasses/contacts?: No Does  the patient have difficulty concentrating, remembering, or making decisions?: Yes  Permission Sought/Granted                  Emotional Assessment              Admission diagnosis:  Metabolic encephalopathy [G93.41] Urinary tract infection [N39.0] Urinary tract infection with hematuria, site unspecified [N39.0, R31.9] UTI (urinary tract infection) [N39.0] Patient Active Problem List   Diagnosis Date Noted   UTI (urinary tract infection) 09/26/2023   Pressure injury of skin 09/26/2023   Urinary tract infection 09/25/2023   Hypokalemia 09/25/2023   Sinus bradycardia 09/25/2023   Metabolic encephalopathy 09/25/2023   Inadequate oral intake 09/25/2023   Dementia with behavioral disturbance (HCC)    PCP:  Alan Mulder, MD Pharmacy:   [facility], [place] - [address] [place] [address] Phone: [phone] Fax: [phone]     Social Determinants of Health (SDOH) Social History: SDOH Screenings   Food Insecurity: Patient Unable To Answer (09/26/2023)  Housing: High Risk (09/26/2023)  Transportation Needs: Patient Unable To Answer (09/26/2023)  Utilities: Patient Unable To Answer (09/26/2023)   SDOH Interventions:     Readmission Risk Interventions     No data to display

## 2023-09-29 DIAGNOSIS — G9341 Metabolic encephalopathy: Secondary | ICD-10-CM | POA: Diagnosis not present

## 2023-09-29 DIAGNOSIS — E43 Unspecified severe protein-calorie malnutrition: Secondary | ICD-10-CM

## 2023-09-29 DIAGNOSIS — F03918 Unspecified dementia, unspecified severity, with other behavioral disturbance: Secondary | ICD-10-CM | POA: Diagnosis not present

## 2023-09-29 DIAGNOSIS — N390 Urinary tract infection, site not specified: Secondary | ICD-10-CM | POA: Diagnosis not present

## 2023-09-29 DIAGNOSIS — E876 Hypokalemia: Secondary | ICD-10-CM | POA: Diagnosis not present

## 2023-09-29 LAB — URINE CULTURE: Culture: >=100000 — AB

## 2023-09-29 NOTE — Progress Notes (Signed)
Initial Nutrition Assessment  DOCUMENTATION CODES:   Severe malnutrition in context of social or environmental circumstances  INTERVENTION:   Ensure Enlive po BID, each supplement provides 350 kcal and 20 grams of protein.  Magic cup TID with meals, each supplement provides 290 kcal and 9 grams of protein  Dysphagia 3 diet  Pt at high refeed risk; recommend monitor potassium, magnesium and phosphorus labs daily until stable  Daily weights   NUTRITION DIAGNOSIS:   Severe Malnutrition related to social / environmental circumstances as evidenced by severe fat depletion, severe muscle depletion, 25 percent weight loss in 9 months.  GOAL:   Patient will meet greater than or equal to 90% of their needs  MONITOR:   PO intake, Supplement acceptance, Labs, Weight trends, I & O's, Skin  REASON FOR ASSESSMENT:   Consult Assessment of nutrition requirement/status  ASSESSMENT:   89 y/o female with h/o dementia, bradycardia, asthma, HTN and followed by hospice who is admitted with UTI and AMS.  Visited pt's room today. Pt is non-verbal. Pt's son at bedside reports pt with fair appetite and oral intake at baseline but reports that pt has had poor oral intake for the past several weeks. Pt is documented to be eating 15-75% of meals in hospital. Per chart, pt is down 30lbs(25%) over the past 9 months; this is severe weight loss. RD will add supplements to help pt meet her estimated needs. RD will also change pt to a mechanical soft diet. Pt is at high refeed risk. Pt is currently pending guardianship. Pt does not qualify for hospice home at this time.   Medications reviewed and include: ciprofloxacin, lovenox, synthroid  Labs reviewed: Na 132(L), K 3.5 wnl, P 2.1(L), Mg 2.0 wnl- 10/5  NUTRITION - FOCUSED PHYSICAL EXAM:  Flowsheet Row Most Recent Value  Orbital Region Severe depletion  Upper Arm Region Severe depletion  Thoracic and Lumbar Region Severe depletion  Buccal Region  Severe depletion  Temple Region Severe depletion  Clavicle Bone Region Severe depletion  Clavicle and Acromion Bone Region Severe depletion  Scapular Bone Region Severe depletion  Dorsal Hand Severe depletion  Patellar Region Severe depletion  Anterior Thigh Region Severe depletion  Posterior Calf Region Severe depletion  Edema (RD Assessment) None  Hair Reviewed  Eyes Reviewed  Mouth Reviewed  Skin Reviewed  Nails Reviewed   Diet Order:   Diet Order             DIET DYS 3 Room service appropriate? No; Fluid consistency: Thin  Diet effective now                  EDUCATION NEEDS:   No education needs have been identified at this time  Skin:  Skin Assessment: Reviewed RN Assessment (Stage II hip)  Last BM:  10/7- TYPE 5  Height:   Ht Readings from Last 1 Encounters:  09/29/23 5\' 6"  (1.676 m)    Weight:   Wt Readings from Last 1 Encounters:  09/29/23 43 kg    Ideal Body Weight:  59 kg  BMI:  Body mass index is 15.32 kg/m.  Estimated Nutritional Needs:   Kcal:  1300-1500kcal/day  Protein:  65-75g/day  Fluid:  1.1-1.3L/day  Betsey Holiday MS, RD, LDN Please refer to [facility] for RD and/or RD on-call/weekend/after hours pager

## 2023-09-29 NOTE — Progress Notes (Signed)
[facility]  - [place] at [facility]   PATIENT NAME: Allison [name]    MR#:  [id number]  DATE OF BIRTH:  03/08/1932  SUBJECTIVE:  Sundra Aland at bedside. Patient not able to answer most of the questions.  Has dementia at baseline. She has been eating up to 20 to 25% of the meals per RN    VITALS:  Blood pressure (!) 167/68, pulse (!) 48, temperature 98.3 F (36.8 C), resp. rate 18, height 5\' 6"  (1.676 m), weight 43 kg, SpO2 100%.  PHYSICAL EXAMINATION:   CV: S1 S2 normal RESP: normal respiratory effort, on RA Extremities: No effusions, edema in BLE SKIN: warm, dry Neuro--non focal, alert. Dementia at baseline  LABORATORY PANEL:  CBC Recent Labs  Lab 09/25/23 2041  WBC 8.7  HGB 14.6  HCT 41.5  PLT 167    Chemistries  Recent Labs  Lab 09/25/23 2041 09/26/23 0552  NA 132* 132*  K 3.0* 3.5  CL 90* 93*  CO2 24 25  GLUCOSE 79 78  BUN 23 23  CREATININE 0.84 0.81  CALCIUM 8.7* 8.6*  MG  --  2.0  AST 29  --   ALT 13  --   ALKPHOS 45  --   BILITOT 1.8*  --    Assessment and Plan TRACEY HERMANCE is a 89 y.o. female with medical history significant for Dementia, hypertension, previously at [facility], now on home hospice, sent in by hospice via EMS as APS case.     Hospice staff reported patient has very little oral intake and has not been taking any medications. Patient has baseline dementia with occasional aggression.  Patient did exhibit aggression in the emergency room, hitting out at staff and was administered Ativan.  Spoke with son on the phone Kerri-Anne Haeberle who stated that for the past 3 weeks patient has been sleeping most of the day and eating very little.  States she came home from [place] back in July and she was able to ambulate independently then.  Currently ambulates very little.     Urinary tract infection --UA with pos nitrite, mod leuk and many bacteria. -- Urine culture has E. coli and Pseudomonas--- change to PO Cipro    acute metabolic encephalopathy Dementia with behavioral disturbance Patient presents with lethargy, agitation, altered mental status beyond baseline, possibly related to underlying UTI --Haldol PRN --avoid benzos   Inadequate oral intake   Sinus bradycardia Heart rate in the 50s--now improved to 60-71 --hold home atenolol   HTN --BP trending up --resume home Zestoretic --clonidine patch listed on home med, however, nursing did not see a patch on pt. --IV hydralazine PRN   Hypokalemia Pharmacy consult to monitor and supplement  Nutrition Status: Nutrition Problem: Severe Malnutrition Etiology: social / environmental circumstances Signs/Symptoms: severe fat depletion, severe muscle depletion, percent weight loss Percent weight loss: 25 %         DVT prophylaxis: Lovenox SQ Code Status: DNR  Family Communication:  Level of care: Med-Surg Dispo:   The patient is from: home Anticipated d/c is to: to be determined Anticipated d/c date is: to be determined Patient currently is not medically ready to d/c due to: DSS managing d/c plans  Patient is under DSS. Discharge planning per TOC. Medically best at baseline      TOTAL TIME TAKING CARE OF THIS PATIENT: 35 minutes.  >50% time spent on counselling and coordination of care  Note: This dictation was prepared with Dragon dictation along  with smaller phrase technology. Any transcriptional errors that result from this process are unintentional.  Enedina Finner M.D    [facility]   CC: Primary care physician; Alan Mulder, MD

## 2023-09-29 NOTE — Care Management Important Message (Signed)
Important Message  Patient Details  Name: EMARI HREHA MRN: [id number] Date of Birth: 09/18/1932   Important Message Given:  N/A - LOS <3 / Initial given by admissions     Olegario Messier A Makyi Ledo 09/29/2023, 2:55 PM

## 2023-09-29 NOTE — TOC Progression Note (Signed)
Transition of Care [facility]) - Progression Note    Patient Details  Name: Allison [name] MRN: [id number] Date of Birth: [birth year]  [facility]) CM/SW Contact  Darolyn Rua, [place] Phone Number: 09/29/2023, 11:02 AM  Clinical Narrative:     Patient does not qualify for hospice home, CSW has informed DSS and requested they start [employer] process as at this time patient has no payor source for long term care facility placement with hospice, does not meet hospice home requirements, and currently DSS reports returning home is not a safe option as they are currently taking over guardianship of patient due to family neglect.   Expected Discharge Plan:  (TBD) Barriers to Discharge: Continued Medical Work up  Expected Discharge Plan and Services       Living arrangements for the past 2 months: Single Family Home                                       Social Determinants of Health (SDOH) Interventions SDOH Screenings   Food Insecurity: Patient Unable To Answer (09/26/2023)  Housing: High Risk (09/26/2023)  Transportation Needs: Patient Unable To Answer (09/26/2023)  Utilities: Patient Unable To Answer (09/26/2023)    Readmission Risk Interventions     No data to display

## 2023-09-29 NOTE — Plan of Care (Signed)
  Problem: Education: Goal: Knowledge of General Education information will improve Description Including pain rating scale, medication(s)/side effects and non-pharmacologic comfort measures Outcome: Progressing   

## 2023-09-30 DIAGNOSIS — N3000 Acute cystitis without hematuria: Secondary | ICD-10-CM | POA: Diagnosis not present

## 2023-09-30 LAB — BASIC METABOLIC PANEL
Anion gap: 10 (ref 5–15)
BUN: 13 mg/dL (ref 8–23)
CO2: 29 mmol/L (ref 22–32)
Calcium: 8.3 mg/dL — ABNORMAL LOW (ref 8.9–10.3)
Chloride: 93 mmol/L — ABNORMAL LOW (ref 98–111)
Creatinine, Ser: 0.77 mg/dL (ref 0.44–1.00)
GFR, Estimated: >60 mL/min (ref >60)
Glucose, Bld: 83 mg/dL (ref 70–99)
Potassium: 2.3 mmol/L — CL (ref 3.5–5.1)
Sodium: 132 mmol/L — ABNORMAL LOW (ref 135–145)

## 2023-09-30 LAB — PHOSPHORUS: Phosphorus: 3 mg/dL (ref 2.5–4.6)

## 2023-09-30 LAB — MAGNESIUM: Magnesium: 1.9 mg/dL (ref 1.7–2.4)

## 2023-09-30 MED ORDER — POTASSIUM CHLORIDE 20 MEQ PO PACK
40.0000 meq | PACK | Freq: Once | ORAL | Status: AC
  Administered 2023-09-30: 40 meq via ORAL
  Filled 2023-09-30: qty 2

## 2023-09-30 MED ORDER — MAGNESIUM SULFATE IN D5W 1-5 GM/100ML-% IV SOLN
1.0000 g | Freq: Once | INTRAVENOUS | Status: AC
  Administered 2023-09-30: 1 g via INTRAVENOUS
  Filled 2023-09-30: qty 100

## 2023-09-30 NOTE — Plan of Care (Signed)
  Problem: Education: Goal: Knowledge of General Education information will improve Description: Including pain rating scale, medication(s)/side effects and non-pharmacologic comfort measures 09/30/2023 0726 by Jennelle Human, RN Outcome: Progressing 09/30/2023 0726 by Jennelle Human, RN Outcome: Progressing 09/30/2023 0726 by Jennelle Human, RN Outcome: Progressing 09/30/2023 0725 by Jennelle Human, RN Outcome: Progressing 09/30/2023 0725 by Jennelle Human, RN Outcome: Progressing   Problem: Clinical Measurements: Goal: Ability to maintain clinical measurements within normal limits will improve 09/30/2023 0726 by Jennelle Human, RN Outcome: Progressing 09/30/2023 0726 by Jennelle Human, RN Outcome: Progressing 09/30/2023 0726 by Jennelle Human, RN Outcome: Progressing 09/30/2023 0725 by Jennelle Human, RN Outcome: Progressing 09/30/2023 0725 by Jennelle Human, RN Outcome: Progressing   Problem: Activity: Goal: Risk for activity intolerance will decrease Outcome: Progressing   Problem: Nutrition: Goal: Adequate nutrition will be maintained 09/30/2023 0725 by Jennelle Human, RN Outcome: Progressing 09/30/2023 0725 by Jennelle Human, RN Outcome: Progressing   Problem: Coping: Goal: Level of anxiety will decrease Outcome: Progressing   Problem: Elimination: Goal: Will not experience complications related to bowel motility Outcome: Progressing Goal: Will not experience complications related to urinary retention Outcome: Progressing   Problem: Safety: Goal: Ability to remain free from injury will improve 09/30/2023 0725 by Jennelle Human, RN Outcome: Progressing 09/30/2023 0725 by Jennelle Human, RN Outcome: Progressing   Problem: Skin Integrity: Goal: Risk for impaired skin integrity will decrease 09/30/2023 0726 by Jennelle Human, RN Outcome: Progressing 09/30/2023 0726 by Jennelle Human, RN Outcome: Progressing

## 2023-09-30 NOTE — Plan of Care (Signed)
  Problem: Education: Goal: Knowledge of General Education information will improve Description: Including pain rating scale, medication(s)/side effects and non-pharmacologic comfort measures 09/30/2023 0725 by Jennelle Human, RN Outcome: Progressing 09/30/2023 0725 by Jennelle Human, RN Outcome: Progressing   Problem: Clinical Measurements: Goal: Ability to maintain clinical measurements within normal limits will improve 09/30/2023 0725 by Jennelle Human, RN Outcome: Progressing 09/30/2023 0725 by Jennelle Human, RN Outcome: Progressing   Problem: Nutrition: Goal: Adequate nutrition will be maintained 09/30/2023 0725 by Jennelle Human, RN Outcome: Progressing 09/30/2023 0725 by Jennelle Human, RN Outcome: Progressing   Problem: Safety: Goal: Ability to remain free from injury will improve 09/30/2023 0725 by Jennelle Human, RN Outcome: Progressing 09/30/2023 0725 by Jennelle Human, RN Outcome: Progressing

## 2023-09-30 NOTE — Care Management Important Message (Signed)
Important Message  Patient Details  Name: Allison [name] MRN: [id number] Date of Birth: [birth year]   Important Message Given:  N/A - LOS <3 / Initial given by admissions     Olegario Messier A Truda Staub 09/30/2023, 10:09 AM

## 2023-09-30 NOTE — Progress Notes (Signed)
  PROGRESS NOTE    Allison [name]  WUJ:[id number] DOB: 1932/08/16 DOA: 09/25/2023 PCP: Alan Mulder, MD  121A/121A-BB  LOS: 4 days   Brief hospital course:   Assessment & Plan: Allison [name] is a 89 y.o. female with medical history significant for Dementia, hypertension, previously at [facility], now on home hospice, sent in by hospice via EMS as APS case.     Hospice staff reported patient has very little oral intake and has not been taking any medications. Patient has baseline dementia with occasional aggression.  Patient did exhibit aggression in the emergency room, hitting out at staff and was administered Ativan.  Spoke with son on the phone Allison [name] who stated that for the past 3 weeks patient has been sleeping most of the day and eating very little.  States she came home from [place] back in July and she was able to ambulate independently then.  Currently ambulates very little.     Urinary tract infection --UA with pos nitrite, mod leuk and many bacteria. -- Urine culture has E. coli and Pseudomonas--- change to PO Cipro    acute metabolic encephalopathy Dementia with behavioral disturbance Patient presents with lethargy, agitation, altered mental status beyond baseline, possibly related to underlying UTI --Haldol PRN --avoid benzos   Inadequate oral intake   Sinus bradycardia Heart rate in the 50s--now improved to 60-71 --hold home atenolol   HTN --BP trending up --resume home Zestoretic --clonidine patch listed on home med, however, nursing did not see a patch on pt. --IV hydralazine PRN   Hypokalemia Pharmacy consult to monitor and supplement   Nutrition Status: Nutrition Problem: Severe Malnutrition Etiology: social / environmental circumstances Signs/Symptoms: severe fat depletion, severe muscle depletion, percent weight loss Percent weight loss: 25 %    DVT prophylaxis: Lovenox SQ Code Status: DNR  Family Communication:  Level of  care: Med-Surg Dispo:   The patient is from: home Anticipated d/c is to: to be determined Anticipated d/c date is: when safe disposition available   Subjective and Interval History:  Pt does not respond to questions or commands.   Objective: Vitals:   09/30/23 0500 09/30/23 0518 09/30/23 0800 09/30/23 1801  BP:  (!) 152/64 130/87 (!) 148/87  Pulse:  (!) 53 72 79  Resp:   19 19  Temp:  (!) 97.5 F (36.4 C) 98.2 F (36.8 C) 98.1 F (36.7 C)  TempSrc:  Oral    SpO2:  98% 96% 96%  Weight: 42.8 kg     Height:        Intake/Output Summary (Last 24 hours) at 09/30/2023 2037 Last data filed at 09/30/2023 9562 Gross per 24 hour  Intake 75.09 ml  Output --  Net 75.09 ml   Filed Weights   09/26/23 0546 09/29/23 1339 09/30/23 0500  Weight: 43.7 kg 43 kg 42.8 kg    Examination:   Constitutional: NAD CV: No cyanosis.   RESP: normal respiratory effort, on RA   Data Reviewed: I have personally reviewed labs and imaging studies  Time spent: 25 minutes  [name], MD [facility] If 7PM-7AM, please contact night-coverage 09/30/2023, 8:37 PM

## 2023-09-30 NOTE — Plan of Care (Signed)
  Problem: Education: Goal: Knowledge of General Education information will improve Description: Including pain rating scale, medication(s)/side effects and non-pharmacologic comfort measures 09/30/2023 0726 by Jennelle Human, RN Outcome: Progressing 09/30/2023 0726 by Jennelle Human, RN Outcome: Progressing 09/30/2023 0725 by Jennelle Human, RN Outcome: Progressing 09/30/2023 0725 by Jennelle Human, RN Outcome: Progressing   Problem: Clinical Measurements: Goal: Ability to maintain clinical measurements within normal limits will improve 09/30/2023 0726 by Jennelle Human, RN Outcome: Progressing 09/30/2023 0726 by Jennelle Human, RN Outcome: Progressing 09/30/2023 0725 by Jennelle Human, RN Outcome: Progressing 09/30/2023 0725 by Jennelle Human, RN Outcome: Progressing   Problem: Nutrition: Goal: Adequate nutrition will be maintained 09/30/2023 0725 by Jennelle Human, RN Outcome: Progressing 09/30/2023 0725 by Jennelle Human, RN Outcome: Progressing   Problem: Coping: Goal: Level of anxiety will decrease Outcome: Progressing   Problem: Elimination: Goal: Will not experience complications related to bowel motility Outcome: Progressing   Problem: Safety: Goal: Ability to remain free from injury will improve 09/30/2023 0725 by Jennelle Human, RN Outcome: Progressing 09/30/2023 0725 by Jennelle Human, RN Outcome: Progressing   Problem: Skin Integrity: Goal: Risk for impaired skin integrity will decrease 09/30/2023 0726 by Jennelle Human, RN Outcome: Progressing 09/30/2023 0726 by Jennelle Human, RN Outcome: Progressing

## 2023-09-30 NOTE — Plan of Care (Signed)

## 2023-09-30 NOTE — Progress Notes (Signed)
Pt continues to fight staff when trying to obtain a bladder scan. Tech notified me she did void this morning & bed linens changed.

## 2023-09-30 NOTE — Plan of Care (Signed)
  Problem: Education: Goal: Knowledge of General Education information will improve Description: Including pain rating scale, medication(s)/side effects and non-pharmacologic comfort measures 09/30/2023 0726 by Jennelle Human, RN Outcome: Progressing 09/30/2023 0725 by Jennelle Human, RN Outcome: Progressing 09/30/2023 0725 by Jennelle Human, RN Outcome: Progressing   Problem: Clinical Measurements: Goal: Ability to maintain clinical measurements within normal limits will improve 09/30/2023 0726 by Jennelle Human, RN Outcome: Progressing 09/30/2023 0725 by Jennelle Human, RN Outcome: Progressing 09/30/2023 0725 by Jennelle Human, RN Outcome: Progressing   Problem: Nutrition: Goal: Adequate nutrition will be maintained 09/30/2023 0725 by Jennelle Human, RN Outcome: Progressing 09/30/2023 0725 by Jennelle Human, RN Outcome: Progressing   Problem: Coping: Goal: Level of anxiety will decrease Outcome: Progressing   Problem: Safety: Goal: Ability to remain free from injury will improve 09/30/2023 0725 by Jennelle Human, RN Outcome: Progressing 09/30/2023 0725 by Jennelle Human, RN Outcome: Progressing   Problem: Skin Integrity: Goal: Risk for impaired skin integrity will decrease Outcome: Progressing

## 2023-10-01 ENCOUNTER — Encounter: Payer: Self-pay | Admitting: Hospitalist

## 2023-10-01 DIAGNOSIS — N3000 Acute cystitis without hematuria: Secondary | ICD-10-CM | POA: Diagnosis not present

## 2023-10-01 LAB — POTASSIUM: Potassium: 3.2 mmol/L — ABNORMAL LOW (ref 3.5–5.1)

## 2023-10-01 MED ORDER — POTASSIUM CHLORIDE CRYS ER 20 MEQ PO TBCR
40.0000 meq | EXTENDED_RELEASE_TABLET | Freq: Once | ORAL | Status: AC
  Administered 2023-10-01: 40 meq via ORAL
  Filled 2023-10-01: qty 2

## 2023-10-01 MED ORDER — CIPROFLOXACIN HCL 500 MG PO TABS
500.0000 mg | ORAL_TABLET | Freq: Two times a day (BID) | ORAL | Status: DC
  Administered 2023-10-01 – 2023-10-02 (×2): 500 mg via ORAL
  Filled 2023-10-01 (×2): qty 1

## 2023-10-01 NOTE — Plan of Care (Signed)

## 2023-10-01 NOTE — Progress Notes (Signed)
  PROGRESS NOTE    [name]  ZOX:[id number] DOB: [birth year] DOA: 09/25/2023 PCP: Alan Mulder, MD  121A/121A-BB  LOS: 5 days   Brief hospital course:   Assessment & Plan: Allison [name] is a 89 y.o. female with medical history significant for Dementia, hypertension, previously at [facility], now on home hospice, sent in by hospice via EMS as APS case.     Hospice staff reported patient has very little oral intake and has not been taking any medications. Patient has baseline dementia with occasional aggression.  Patient did exhibit aggression in the emergency room, hitting out at staff and was administered Ativan.  Spoke with son on the phone Allison [name] who stated that for the past 3 weeks patient has been sleeping most of the day and eating very little.  States she came home from [place] back in July and she was able to ambulate independently then.  Currently ambulates very little.     Urinary tract infection --UA with pos nitrite, mod leuk and many bacteria. -- Urine culture has E. coli and Pseudomonas--- change to PO Cipro    acute metabolic encephalopathy Dementia with behavioral disturbance Patient presents with lethargy, agitation, altered mental status beyond baseline, possibly related to underlying UTI --Haldol PRN --avoid benzos   Inadequate oral intake   Sinus bradycardia Heart rate in the 50s--now improved to 60-71 --hold home atenolol   HTN --clonidine patch listed on home med, however, nursing did not see a patch on pt. --cont Lisinopril --IV hydralazine PRN   Hypokalemia Pharmacy consult to monitor and supplement   Nutrition Status: Nutrition Problem: Severe Malnutrition Etiology: social / environmental circumstances Signs/Symptoms: severe fat depletion, severe muscle depletion, percent weight loss Percent weight loss: 25 %    DVT prophylaxis: Lovenox SQ Code Status: DNR  Family Communication:  Level of care: Med-Surg Dispo:    The patient is from: home Anticipated d/c is to: to be determined Anticipated d/c date is: when safe disposition available   Subjective and Interval History:  More interactive today, however, not oriented.   Objective: Vitals:   09/30/23 2204 10/01/23 0549 10/01/23 0830 10/01/23 1630  BP: (!) 172/86 (!) 178/81 (!) 150/76 (!) 160/106  Pulse: (!) 55 (!) 55 77 65  Resp: 16 16 16 15   Temp: 98 F (36.7 C) 97.7 F (36.5 C) 97.8 F (36.6 C) 98.3 F (36.8 C)  TempSrc:   Oral   SpO2: 97% 97% 98%   Weight:      Height:       No intake or output data in the 24 hours ending 10/01/23 1947  Filed Weights   09/26/23 0546 09/29/23 1339 09/30/23 0500  Weight: 43.7 kg 43 kg 42.8 kg    Examination:   Constitutional: NAD, alert, not oriented HEENT: conjunctivae and lids normal, EOMI CV: No cyanosis.   RESP: normal respiratory effort, on RA SKIN: warm, dry   Data Reviewed: I have personally reviewed labs and imaging studies  Time spent: 25 minutes  [name], MD [facility] If 7PM-7AM, please contact night-coverage 10/01/2023, 7:47 PM

## 2023-10-01 NOTE — Care Management Important Message (Signed)
Important Message  Patient Details  Name: Allison [name] MRN: [id number] Date of Birth: May 08, 1932   Important Message Given:  N/A - LOS <3 / Initial given by admissions     Allison [name] 10/01/2023, 8:31 AM

## 2023-10-02 DIAGNOSIS — F03918 Unspecified dementia, unspecified severity, with other behavioral disturbance: Secondary | ICD-10-CM | POA: Diagnosis not present

## 2023-10-02 DIAGNOSIS — N390 Urinary tract infection, site not specified: Secondary | ICD-10-CM | POA: Diagnosis not present

## 2023-10-02 DIAGNOSIS — G9341 Metabolic encephalopathy: Secondary | ICD-10-CM | POA: Diagnosis not present

## 2023-10-02 DIAGNOSIS — E876 Hypokalemia: Secondary | ICD-10-CM | POA: Diagnosis not present

## 2023-10-02 LAB — POTASSIUM: Potassium: 3.9 mmol/L (ref 3.5–5.1)

## 2023-10-02 MED ORDER — CIPROFLOXACIN HCL 500 MG PO TABS
500.0000 mg | ORAL_TABLET | Freq: Two times a day (BID) | ORAL | Status: AC
  Administered 2023-10-02 – 2023-10-04 (×4): 500 mg via ORAL
  Filled 2023-10-02 (×4): qty 1
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
## 2023-10-02 NOTE — Progress Notes (Addendum)
Entered in error

## 2023-10-02 NOTE — Plan of Care (Signed)
Patient alert to self. Agitated and aggressive refused to finish ice cream with crushed meds. Provider notified. Uncooperative asking patient permission to complete task patient replies yes then become physically aggressive when attempting to complete task. BS 55ml, bedding completely changed and pericare completed. Bed is in lowest locked position call bell in reach.Nursing will continue to monitor.   Problem: Elimination: Goal: Will not experience complications related to bowel motility Outcome: Not Progressing   Problem: Pain Managment: Goal: General experience of comfort will improve Outcome: Not Progressing   Problem: Safety: Goal: Ability to remain free from injury will improve Outcome: Not Progressing   Problem: Skin Integrity: Goal: Risk for impaired skin integrity will decrease Outcome: Not Progressing

## 2023-10-02 NOTE — Progress Notes (Signed)
Patient fed herself dinner and she ate 75% and 1 cup of tea, 1 cup of water. Patient took her medication crushed with magic cup ice-cream.   No agitation or aggression  during med pass and assessment.

## 2023-10-02 NOTE — Plan of Care (Signed)

## 2023-10-02 NOTE — Progress Notes (Signed)
  PROGRESS NOTE    Allison [name]  WUJ:[id number] DOB: 10/30/1932 DOA: 09/25/2023 PCP: Alan Mulder, MD  121A/121A-BB  LOS: 6 days   Brief hospital course:  Allison [name] is a 89 y.o. female with medical history significant for Dementia, hypertension, previously at [facility], now on home hospice, sent in by hospice via EMS as APS case.     Hospice staff reported patient has very little oral intake and has not been taking any medications. Patient has baseline dementia with occasional aggression.  Patient did exhibit aggression in the emergency room, hitting out at staff and was administered Ativan.  Spoke with son on the phone Shandon Matson who stated that for the past 3 weeks patient has been sleeping most of the day and eating very little.  States she came home from [place] back in July and she was able to ambulate independently then.  Currently ambulates very little.     Urinary tract infection --UA with pos nitrite, mod leuk and many bacteria. --Urine culture has E. coli and Pseudomonas--- change to PO Cipro for 7 days (d/w R[facility])   Acute metabolic encephalopathy Dementia with behavioral disturbance Patient presents with lethargy, agitation, altered mental status beyond baseline, possibly related to underlying UTI --Haldol PRN --avoid benzos   Inadequate oral intake   Sinus bradycardia Heart rate in the 50s--now improved to 60-71 --hold home atenolol   HTN --clonidine patch listed on home med, however, nursing did not see a patch on pt. --cont Lisinopril --IV hydralazine PRN   Hypokalemia Pharmacy consult to monitor and supplement   Nutrition Status: Nutrition Problem: Severe Malnutrition Etiology: social / environmental circumstances Signs/Symptoms: severe fat depletion, severe muscle depletion, percent weight loss Percent weight loss: 25 %    DVT prophylaxis: Lovenox SQ--pt has been refusing it itermittently Code Status: DNR  Family Communication:  none today Level of care: Med-Surg Dispo:   The patient is from: home Anticipated d/c is to: to be determined Anticipated d/c date is: when safe disposition available   Subjective and Interval History:  More interactive today, however, not oriented. Feeding BF by herself   Objective: Vitals:   10/01/23 1958 10/02/23 0553 10/02/23 0702 10/02/23 0751  BP: (!) 145/66 (!) 160/81  (!) 170/89  Pulse: 62 60  (!) 116  Resp: 16 16  18   Temp: 97.6 F (36.4 C) 97.8 F (36.6 C)  (!) 97.4 F (36.3 C)  TempSrc:      SpO2: 98% 98%  97%  Weight:   44.6 kg   Height:       No intake or output data in the 24 hours ending 10/02/23 1258  Filed Weights   09/29/23 1339 09/30/23 0500 10/02/23 0702  Weight: 43 kg 42.8 kg 44.6 kg    Examination:   Constitutional: NAD, alert, not oriented HEENT: conjunctivae and lids normal, EOMI CV: No cyanosis.   RESP: normal respiratory effort, on RA SKIN: warm, dry   Data Reviewed: I have personally reviewed labs and imaging studies  Time spent: 25 minutes  Enedina Finner, MD [facility] If 7PM-7AM, please contact night-coverage 10/02/2023, 12:58 PM

## 2023-10-02 NOTE — Progress Notes (Signed)
Patient admitted for severe malnutrition, UTI. Past medical history significant for Dementia, hypertension, previously at [facility], now on home hospice, sent in by hospice via EMS as APS case.   Patient still has decreased PO intake since admission. Patient has not voided this shift. She did have a BM this shift. Bladder scan shows .   RN notified Manuela Schwartz, NP of the above information.   No orders received.

## 2023-10-02 NOTE — Progress Notes (Signed)
Attempted to give lovenox shot to patient while she gave permission then became physically aggressive when the attempt was made provider notified.

## 2023-10-03 DIAGNOSIS — N3000 Acute cystitis without hematuria: Secondary | ICD-10-CM | POA: Diagnosis not present

## 2023-10-03 NOTE — Plan of Care (Signed)

## 2023-10-03 NOTE — Progress Notes (Signed)
  PROGRESS NOTE    Allison [name]  [id number] DOB: Jan 22, 1932 DOA: 09/25/2023 PCP: Alan Mulder, MD  121A/121A-BB  LOS: 7 days   Brief hospital course:  Allison [name] is a 89 y.o. female with medical history significant for Dementia, hypertension, previously at [facility], now on home hospice, sent in by hospice via EMS as APS case.     Hospice staff reported patient has very little oral intake and has not been taking any medications. Patient has baseline dementia with occasional aggression.  Patient did exhibit aggression in the emergency room, hitting out at staff and was administered Ativan.  Spoke with son on the phone Allison [name] who stated that for the past 3 weeks patient has been sleeping most of the day and eating very little.  States she came home from [place] back in July and she was able to ambulate independently then.  Currently ambulates very little.     Urinary tract infection --UA with pos nitrite, mod leuk and many bacteria. --Urine culture has E. coli and Pseudomonas--- change to PO Cipro for 7 days (d/w RPH)   Acute metabolic encephalopathy Dementia with behavioral disturbance Patient presents with lethargy, agitation, altered mental status beyond baseline, possibly related to underlying UTI --Haldol PRN --avoid benzos   Inadequate oral intake   Sinus bradycardia Heart rate in the 50s--now improved to 60-71 --hold home atenolol   HTN --clonidine patch listed on home med, however, nursing did not see a patch on pt. --cont Lisinopril --IV hydralazine PRN   Hypokalemia Pharmacy consult to monitor and supplement   Nutrition Status: Nutrition Problem: Severe Malnutrition Etiology: social / environmental circumstances Signs/Symptoms: severe fat depletion, severe muscle depletion, percent weight loss Percent weight loss: 25 %    DVT prophylaxis: Lovenox SQ--pt has been refusing it itermittently Code Status: DNR  Family Communication:   Level of care: Med-Surg Dispo:   The patient is from: home Anticipated d/c is to: to be determined Anticipated d/c date is: when safe disposition available   Subjective and Interval History:  Pt found to be sleeping most of the time.   Objective: Vitals:   10/03/23 0503 10/03/23 0517 10/03/23 0810 10/03/23 1530  BP: (!) 194/89 (!) 175/85 (!) 164/99 (!) 153/92  Pulse: 66  65 80  Resp: 20  12 18   Temp: 97.6 F (36.4 C)  (!) 97 F (36.1 C) 98 F (36.7 C)  TempSrc: Oral     SpO2: 98%  99% 98%  Weight:      Height:        Intake/Output Summary (Last 24 hours) at 10/03/2023 1757 Last data filed at 10/03/2023 1311 Gross per 24 hour  Intake 340 ml  Output --  Net 340 ml    Filed Weights   09/30/23 0500 10/02/23 0702 10/03/23 0500  Weight: 42.8 kg 44.6 kg 44.4 kg    Examination:   Constitutional: NAD, sleeping CV: No cyanosis.   RESP: normal respiratory effort, on RA   Data Reviewed: I have personally reviewed labs and imaging studies  Time spent: 25 minutes  [name], MD [facility] If 7PM-7AM, please contact night-coverage 10/03/2023, 5:57 PM

## 2023-10-03 NOTE — Progress Notes (Signed)
BP was 194/90 while sitting up eating with legs crossed. RN rechecked BP with legs uncrossed. BP was 190/100. RN administered prn hydralazine per the order.   BP rechecked 159/88   RN notified Larkin Ina, NP and Manuela Schwartz, NP of the above information.

## 2023-10-04 DIAGNOSIS — N3000 Acute cystitis without hematuria: Secondary | ICD-10-CM | POA: Diagnosis not present

## 2023-10-04 MED ORDER — LISINOPRIL 20 MG PO TABS
40.0000 mg | ORAL_TABLET | Freq: Every day | ORAL | Status: DC
  Administered 2023-10-04 – 2023-12-03 (×52): 40 mg via ORAL
  Filled 2023-10-04 (×62): qty 2

## 2023-10-04 NOTE — Progress Notes (Signed)
  PROGRESS NOTE    Allison [name]  [id number]:[id number] DOB: Jan 29, 1932 DOA: 09/25/2023 PCP: Alan Mulder, MD  121A/121A-BB  LOS: 8 days   Brief hospital course:  Allison [name] is a 89 y.o. female with medical history significant for Dementia, hypertension, previously at [facility], now on home hospice, sent in by hospice via EMS as APS case.     Hospice staff reported patient has very little oral intake and has not been taking any medications. Patient has baseline dementia with occasional aggression.  Patient did exhibit aggression in the emergency room, hitting out at staff and was administered Ativan.  Spoke with son on the phone Allison [name] who stated that for the past 3 weeks patient has been sleeping most of the day and eating very little.  States she came home from [place] back in July and she was able to ambulate independently then.  Currently ambulates very little.     Urinary tract infection --UA with pos nitrite, mod leuk and many bacteria. --Urine culture has E. coli and Pseudomonas--- change to PO Cipro for 7 days (d/w RPH)   Acute metabolic encephalopathy Dementia with behavioral disturbance Patient presents with lethargy, agitation, altered mental status beyond baseline, possibly related to underlying UTI --Haldol PRN --avoid benzos   Inadequate oral intake   Sinus bradycardia Heart rate in the 50s--now improved to 60-71 --hold home atenolol   HTN --clonidine patch listed on home med, however, nursing did not see a patch on pt. --cont Lisinopril --IV hydralazine PRN   Hypokalemia Pharmacy consult to monitor and supplement   Nutrition Status: Nutrition Problem: Severe Malnutrition Etiology: social / environmental circumstances Signs/Symptoms: severe fat depletion, severe muscle depletion, percent weight loss Percent weight loss: 25 %    DVT prophylaxis: Lovenox SQ--pt has been refusing it itermittently Code Status: DNR  Family Communication:   Level of care: Med-Surg Dispo:   The patient is from: home Anticipated d/c is to: to be determined Anticipated d/c date is: when safe disposition available   Subjective and Interval History:  Mostly curled up in bed sleeping.   Objective: Vitals:   10/03/23 2143 10/04/23 0134 10/04/23 0533 10/04/23 0815  BP: (!) 150/83  (!) 158/76 (!) 158/87  Pulse: 69  66 65  Resp: 16  16   Temp: 97.9 F (36.6 C)  98.1 F (36.7 C) 98.5 F (36.9 C)  TempSrc: Oral  Oral   SpO2: 97%   100%  Weight:  44.9 kg    Height:        Intake/Output Summary (Last 24 hours) at 10/04/2023 1619 Last data filed at 10/03/2023 2014 Gross per 24 hour  Intake 315 ml  Output --  Net 315 ml    Filed Weights   10/02/23 0702 10/03/23 0500 10/04/23 0134  Weight: 44.6 kg 44.4 kg 44.9 kg    Examination:   Constitutional: NAD, sleeping but arousable, not oriented CV: No cyanosis.   RESP: normal respiratory effort, on RA   Data Reviewed: I have personally reviewed labs and imaging studies  Time spent: 25 minutes  [name], MD [facility] If 7PM-7AM, please contact night-coverage 10/04/2023, 4:19 PM

## 2023-10-04 NOTE — Plan of Care (Signed)
  Problem: Safety: Goal: Ability to remain free from injury will improve Outcome: Not Progressing   Problem: Skin Integrity: Goal: Risk for impaired skin integrity will decrease Outcome: Not Progressing   Problem: Pain Managment: Goal: General experience of comfort will improve Outcome: Not Progressing   Problem: Nutrition: Goal: Adequate nutrition will be maintained Outcome: Not Progressing

## 2023-10-05 DIAGNOSIS — N3000 Acute cystitis without hematuria: Secondary | ICD-10-CM | POA: Diagnosis not present

## 2023-10-05 NOTE — TOC Transition Note (Signed)
Transition of Care [facility]) - CM/SW Discharge Note   Patient Details  Name: ALAYSHA JEFCOAT MRN: [id number] Date of Birth: 07/22/1932  Transition of Care [facility]) CM/SW Contact:  Garret Reddish, RN Phone Number: 10/05/2023, 10:44 AM   Clinical Narrative:    Chart reviewed.  Left voice message for [facility] APS worker to provide update on patient's case.  TOC will to follow for discharge planning planning.       Barriers to Discharge: Continued Medical Work up   Patient Goals and CMS Choice CMS Medicare.gov Compare Post Acute Care list provided to:: Patient Choice offered to / list presented to : Patient  Discharge Placement                         Discharge Plan and Services Additional resources added to the After Visit Summary for                                       Social Determinants of Health (SDOH) Interventions SDOH Screenings   Food Insecurity: Patient Unable To Answer (09/26/2023)  Housing: High Risk (09/26/2023)  Transportation Needs: Patient Unable To Answer (09/26/2023)  Utilities: Patient Unable To Answer (09/26/2023)  Tobacco Use: Unknown (10/01/2023)     Readmission Risk Interventions     No data to display

## 2023-10-05 NOTE — Plan of Care (Signed)
  Problem: Coping: Goal: Level of anxiety will decrease Outcome: Not Progressing   Problem: Skin Integrity: Goal: Risk for impaired skin integrity will decrease Outcome: Not Progressing   Problem: Safety: Goal: Ability to remain free from injury will improve Outcome: Not Progressing

## 2023-10-05 NOTE — Progress Notes (Signed)
  PROGRESS NOTE    [name]  ZOX:[id number] DOB: 1932/11/26 DOA: 09/25/2023 PCP: Alan Mulder, MD  121A/121A-BB  LOS: 9 days   Brief hospital course:  Allison [name] is a 89 y.o. female with medical history significant for Dementia, hypertension, previously at [facility], now on home hospice, sent in by hospice via EMS as APS case.     Hospice staff reported patient has very little oral intake and has not been taking any medications. Patient has baseline dementia with occasional aggression.  Patient did exhibit aggression in the emergency room, hitting out at staff and was administered Ativan.  Spoke with son on the phone Allison [name] who stated that for the past 3 weeks patient has been sleeping most of the day and eating very little.  States she came home from [place] back in July and she was able to ambulate independently then.  Currently ambulates very little.     Urinary tract infection --UA with pos nitrite, mod leuk and many bacteria. --Urine culture has E. coli and Pseudomonas--- change to PO Cipro for 7 days (d/w RPH)   Acute metabolic encephalopathy Dementia with behavioral disturbance Patient presents with lethargy, agitation, altered mental status beyond baseline, possibly related to underlying UTI --Haldol PRN --avoid benzos   Inadequate oral intake   Sinus bradycardia Heart rate in the 50s--now improved to 60-71 --hold home atenolol   HTN --clonidine patch listed on home med, however, nursing did not see a patch on pt. --cont Lisinopril --IV hydralazine PRN   Hypokalemia Pharmacy consult to monitor and supplement   Nutrition Status: Nutrition Problem: Severe Malnutrition Etiology: social / environmental circumstances Signs/Symptoms: severe fat depletion, severe muscle depletion, percent weight loss Percent weight loss: 25 %    DVT prophylaxis: Lovenox SQ--pt has been refusing it itermittently Code Status: DNR  Family Communication:   Level of care: Med-Surg Dispo:   The patient is from: home Anticipated d/c is to: to be determined Anticipated d/c date is: when safe disposition available   Subjective and Interval History:  Mostly remained curled up and sleeping.   Objective: Vitals:   10/05/23 0255 10/05/23 0508 10/05/23 0753 10/05/23 1625  BP:  (!) 158/82 (!) 140/94 (!) 144/80  Pulse:  68 64   Resp:  17 17 16   Temp:  97.7 F (36.5 C) (!) 97.4 F (36.3 C) 98.6 F (37 C)  TempSrc:      SpO2:  100% 100%   Weight: 43.9 kg     Height:        Intake/Output Summary (Last 24 hours) at 10/05/2023 1840 Last data filed at 10/05/2023 1051 Gross per 24 hour  Intake 405 ml  Output --  Net 405 ml    Filed Weights   10/03/23 0500 10/04/23 0134 10/05/23 0255  Weight: 44.4 kg 44.9 kg 43.9 kg    Examination:   Constitutional: NAD CV: No cyanosis.   RESP: normal respiratory effort, on RA Extremities: No effusions, edema in BLE SKIN: warm, dry   Data Reviewed: I have personally reviewed labs and imaging studies  Time spent: 25 minutes  [name], MD [facility] If 7PM-7AM, please contact night-coverage 10/05/2023, 6:40 PM

## 2023-10-06 DIAGNOSIS — N3000 Acute cystitis without hematuria: Secondary | ICD-10-CM | POA: Diagnosis not present

## 2023-10-06 MED ORDER — ATENOLOL 50 MG PO TABS
50.0000 mg | ORAL_TABLET | Freq: Every day | ORAL | Status: DC
  Administered 2023-10-06 – 2023-12-01 (×50): 50 mg via ORAL
  Filled 2023-10-06 (×57): qty 1

## 2023-10-06 MED ORDER — ATENOLOL 100 MG PO TABS: 100.0000 mg | ORAL_TABLET | Freq: Every day | ORAL | Status: DC

## 2023-10-06 MED ORDER — ADULT MULTIVITAMIN W/MINERALS CH
1.0000 | ORAL_TABLET | Freq: Every day | ORAL | Status: DC
  Administered 2023-10-06 – 2023-12-29 (×84): 1 via ORAL
  Filled 2023-10-06 (×85): qty 1

## 2023-10-06 NOTE — Care Management Important Message (Signed)
Important Message  Patient Details  Name: Allison [name] MRN: [id number] Date of Birth: 06/27/1932   Important Message Given:  Yes - Medicare IM     Bernadette Hoit 10/06/2023, 11:28 AM

## 2023-10-06 NOTE — Progress Notes (Signed)
Nutrition Follow-up  DOCUMENTATION CODES:   Severe malnutrition in context of social or environmental circumstances  INTERVENTION:   -Continue Ensure Enlive po BID, each supplement provides 350 kcal and 20 grams of protein.  -Continue Magic cup TID with meals, each supplement provides 290 kcal and 9 grams of protein  -Continue dysphagia 3 diet  -MVI with minerals daily  NUTRITION DIAGNOSIS:   Severe Malnutrition related to social / environmental circumstances as evidenced by severe fat depletion, severe muscle depletion, percent weight loss.  Ongoing  GOAL:   Patient will meet greater than or equal to 90% of their needs  Progressing   MONITOR:   PO intake, Supplement acceptance, Labs, Weight trends, I & O's, Skin  REASON FOR ASSESSMENT:   Consult Assessment of nutrition requirement/status  ASSESSMENT:   89 y/o female with h/o dementia, bradycardia, asthma, HTN and followed by hospice who is admitted with UTI and AMS.  Reviewed I/O's: 0 ml x 24 hours and +1.2 L since admission  Pt sleeping soundly at time of visit. She did not respond to name being called. No family present to provide additional history.   Per RN, pt is able to feed herself. Noted intake improving; meal completions 50-100%. Pt is drinking Ensure supplements.   Noted mild wt gain since last visit, which is favorable given malnutrition.   Per TOC notes, DSS in the process of guardianship and plans to submit to [facility] for LTC payor source.   Labs reviewed: Na: 132.   Diet Order:   Diet Order             DIET DYS 3 Room service appropriate? No; Fluid consistency: Thin  Diet effective now                   EDUCATION NEEDS:   No education needs have been identified at this time  Skin:  Skin Assessment: Skin Integrity Issues: Skin Integrity Issues:: Stage II Stage II: lt hip Stage III: n/a  Last BM:  10/06/23 (type 4)  Height:   Ht Readings from Last 1 Encounters:  09/29/23 5\' 6"   (1.676 m)    Weight:   Wt Readings from Last 1 Encounters:  10/06/23 46 kg    Ideal Body Weight:  59 kg  BMI:  Body mass index is 16.37 kg/m.  Estimated Nutritional Needs:   Kcal:  1400-1600  Protein:  70-85 grams  Fluid:  > 1.4 L    Levada Schilling, RD, LDN, CDCES Registered Dietitian III Certified Diabetes Care and Education Specialist Please refer to [facility] for RD and/or RD on-call/weekend/after hours pager

## 2023-10-06 NOTE — Progress Notes (Signed)
  PROGRESS NOTE    Allison [name]  WUJ:[id number] DOB: 1932/12/03 DOA: 09/25/2023 PCP: Alan Mulder, MD  113A/113A-AA  LOS: 10 days   Brief hospital course:  Allison [name] is a 89 y.o. female with medical history significant for Dementia, hypertension, previously at [facility], now on home hospice, sent in by hospice via EMS as APS case.     Hospice staff reported patient has very little oral intake and has not been taking any medications. Patient has baseline dementia with occasional aggression.  Patient did exhibit aggression in the emergency room, hitting out at staff and was administered Ativan.  Spoke with son on the phone Allison [name] who stated that for the past 3 weeks patient has been sleeping most of the day and eating very little.  States she came home from [place] back in July and she was able to ambulate independently then.  Currently ambulates very little.     Urinary tract infection --UA with pos nitrite, mod leuk and many bacteria. --Urine culture has E. coli and Pseudomonas.  Received 3 days of ceftriaxone followed by 7 days of Cipro.   Acute metabolic encephalopathy Dementia with behavioral disturbance Patient presents with lethargy, agitation, altered mental status beyond baseline, possibly related to underlying UTI --Haldol PRN --avoid benzos   Inadequate oral intake   Sinus bradycardia Heart rate in the 50s--now improved to 60-71 --resume home atenolol at reduced 50 mg daily   HTN --clonidine patch listed on home med, however, nursing did not see a patch on pt. --cont Lisinopril --resume home atenolol at reduced 50 mg daily --IV hydralazine PRN   Hypokalemia Pharmacy consult to monitor and supplement   Nutrition Status: Nutrition Problem: Severe Malnutrition Etiology: social / environmental circumstances Signs/Symptoms: severe fat depletion, severe muscle depletion, percent weight loss Percent weight loss: 25 %    DVT prophylaxis:  Lovenox SQ--pt has been refusing it itermittently Code Status: DNR  Family Communication:  Level of care: Med-Surg Dispo:   The patient is from: home Anticipated d/c is to: to be determined Anticipated d/c date is: when safe disposition available   Subjective and Interval History:  Pt was mostly curled up sleeping, but said "quit it!" When touched or being examined.   Objective: Vitals:   10/06/23 0452 10/06/23 0500 10/06/23 0814 10/06/23 1542  BP: (!) 153/81  (!) 175/80 (!) 177/87  Pulse: 65  66 75  Resp:   16 17  Temp: 97.9 F (36.6 C)  98.3 F (36.8 C) 97.7 F (36.5 C)  TempSrc: Oral  Oral   SpO2: 100%  98% 98%  Weight:  46 kg    Height:        Intake/Output Summary (Last 24 hours) at 10/06/2023 1913 Last data filed at 10/06/2023 0949 Gross per 24 hour  Intake 237 ml  Output --  Net 237 ml    Filed Weights   10/04/23 0134 10/05/23 0255 10/06/23 0500  Weight: 44.9 kg 43.9 kg 46 kg    Examination:   Constitutional: NAD, sleeping but easily arousable, not oriented HEENT: conjunctivae and lids normal, EOMI CV: No cyanosis.   RESP: normal respiratory effort, on RA Extremities: No effusions, edema in BLE SKIN: warm, dry   Data Reviewed: I have personally reviewed labs and imaging studies  Time spent: 25 minutes  [name], MD [facility] If 7PM-7AM, please contact night-coverage 10/06/2023, 7:13 PM

## 2023-10-06 NOTE — Plan of Care (Signed)

## 2023-10-07 ENCOUNTER — Encounter: Payer: Self-pay | Admitting: Hospitalist

## 2023-10-07 DIAGNOSIS — N3000 Acute cystitis without hematuria: Secondary | ICD-10-CM | POA: Diagnosis not present

## 2023-10-07 LAB — CBC
HCT: 38.7 % (ref 36.0–46.0)
Hemoglobin: 12.9 g/dL (ref 12.0–15.0)
MCH: 31.8 pg (ref 26.0–34.0)
MCHC: 33.3 g/dL (ref 30.0–36.0)
MCV: 95.3 fL (ref 80.0–100.0)
Platelets: 211 10*3/uL (ref 150–400)
RBC: 4.06 MIL/uL (ref 3.87–5.11)
RDW: 14.8 % (ref 11.5–15.5)
WBC: 4.4 10*3/uL (ref 4.0–10.5)
nRBC: 0 % (ref 0.0–0.2)

## 2023-10-07 LAB — BASIC METABOLIC PANEL
Anion gap: 7 (ref 5–15)
BUN: 22 mg/dL (ref 8–23)
CO2: 27 mmol/L (ref 22–32)
Calcium: 8.4 mg/dL — ABNORMAL LOW (ref 8.9–10.3)
Chloride: 98 mmol/L (ref 98–111)
Creatinine, Ser: 0.66 mg/dL (ref 0.44–1.00)
GFR, Estimated: >60 mL/min (ref >60)
Glucose, Bld: 80 mg/dL (ref 70–99)
Potassium: 4 mmol/L (ref 3.5–5.1)
Sodium: 132 mmol/L — ABNORMAL LOW (ref 135–145)

## 2023-10-07 NOTE — Progress Notes (Signed)
PROGRESS NOTE    Allison [name]  ZOX:[id number] DOB: 05-Dec-1932 DOA: 09/25/2023 PCP: Alan Mulder, MD   Assessment & Plan:   Principal Problem:   Urinary tract infection Active Problems:   Metabolic encephalopathy   Dementia with behavioral disturbance (HCC)   Hypokalemia   Sinus bradycardia   Inadequate oral intake   UTI (urinary tract infection)   Pressure injury of skin   Protein-calorie malnutrition, severe  Assessment and Plan:  UTI: urine cx grew e.coli & pseudomonas. Completed abx course w/ rocephin & cipro    Acute metabolic encephalopathy: re-orient prn. Continue w/ supportive care  Dementia with behavioral disturbance: haldol prn. Avoid benzos   Sinus bradycardia: continue on atenolol but at reduced dose    HTN: continue on lisinopril, atenolol. Hydralazine prn    Hypokalemia: WNL today    Severe Malnutrition: continue on nutritional supplements      DVT prophylaxis: lovenox  Code Status: DNR  Family Communication: Disposition Plan: unsafe d/c plan. See CM's notes   Level of care: Med-Surg Status is: Inpatient Remains inpatient appropriate because: medically stable. Unsafe d/c plan     Consultants:    Procedures:   Antimicrobials:   Subjective: Pt pleasantly confused   Objective: Vitals:   10/06/23 2001 10/07/23 0500 10/07/23 0522 10/07/23 0730  BP: (!) 168/91  (!) 166/82 (!) 160/88  Pulse: 94  (!) 58 83  Resp: 17  17 17   Temp: (!) 97 F (36.1 C)  98.7 F (37.1 C) 98.4 F (36.9 C)  TempSrc:      SpO2: 96%  99% (!) 85%  Weight:  46.1 kg    Height:        Intake/Output Summary (Last 24 hours) at 10/07/2023 0752 Last data filed at 10/06/2023 0949 Gross per 24 hour  Intake 237 ml  Output --  Net 237 ml   Filed Weights   10/05/23 0255 10/06/23 0500 10/07/23 0500  Weight: 43.9 kg 46 kg 46.1 kg    Examination:  General exam: Appears calm and comfortable. Cachetic appearing  Respiratory system: Clear to  auscultation. Respiratory effort normal. Cardiovascular system: S1 & S2 +. No  rubs, gallops or clicks.  Gastrointestinal system: Abdomen is nondistended, soft and nontender. Hypoactive bowel sounds heard. Central nervous system: Alert and awake  Psychiatry: Judgement and insight appears poor. Flat mood and affect    Data Reviewed: I have personally reviewed following labs and imaging studies  CBC: No results for input(s): "WBC", "NEUTROABS", "HGB", "HCT", "MCV", "PLT" in the last 168 hours. Basic Metabolic Panel: Recent Labs  Lab 10/01/23 0536 10/02/23 0409  K 3.2* 3.9   GFR: Estimated Creatinine Clearance: 34.7 mL/min (by C-G formula based on SCr of 0.77 mg/dL). Liver Function Tests: No results for input(s): "AST", "ALT", "ALKPHOS", "BILITOT", "PROT", "ALBUMIN" in the last 168 hours. No results for input(s): "LIPASE", "AMYLASE" in the last 168 hours. No results for input(s): "AMMONIA" in the last 168 hours. Coagulation Profile: No results for input(s): "INR", "PROTIME" in the last 168 hours. Cardiac Enzymes: No results for input(s): "CKTOTAL", "CKMB", "CKMBINDEX", "TROPONINI" in the last 168 hours. BNP (last 3 results) No results for input(s): "PROBNP" in the last 8760 hours. HbA1C: No results for input(s): "HGBA1C" in the last 72 hours. CBG: No results for input(s): "GLUCAP" in the last 168 hours. Lipid Profile: No results for input(s): "CHOL", "HDL", "LDLCALC", "TRIG", "CHOLHDL", "LDLDIRECT" in the last 72 hours. Thyroid Function Tests: No results for input(s): "TSH", "T4TOTAL", "FREET4", "T3FREE", "THYROIDAB"  in the last 72 hours. Anemia Panel: No results for input(s): "VITAMINB12", "FOLATE", "FERRITIN", "TIBC", "IRON", "RETICCTPCT" in the last 72 hours. Sepsis Labs: No results for input(s): "PROCALCITON", "LATICACIDVEN" in the last 168 hours.  No results found for this or any previous visit (from the past 240 hour(s)).       Radiology Studies: No results  found.      Scheduled Meds:  atenolol  50 mg Oral Daily   donepezil  10 mg Oral Daily   enoxaparin (LOVENOX) injection  40 mg Subcutaneous Q24H   feeding supplement  237 mL Oral BID BM   FLUoxetine  10 mg Oral Daily   levothyroxine  100 mcg Oral Q0600   lisinopril  40 mg Oral Daily   multivitamin with minerals  1 tablet Oral Daily   Continuous Infusions:   LOS: 11 days    Charise Killian, MD [facility] Pager [phone]  If 7PM-7AM, please contact night-coverage [email or web address] 10/07/2023, 7:52 AM

## 2023-10-07 NOTE — Plan of Care (Signed)
Pt alert to self history of Dementia. Pt eating tolerating diet well and at times shows aggression. Awaiting for DSS guardianship.  Problem: Education: Goal: Knowledge of General Education information will improve Description: Including pain rating scale, medication(s)/side effects and non-pharmacologic comfort measures Outcome: Progressing   Problem: Health Behavior/Discharge Planning: Goal: Ability to manage health-related needs will improve Outcome: Progressing   Problem: Clinical Measurements: Goal: Ability to maintain clinical measurements within normal limits will improve Outcome: Progressing Goal: Will remain free from infection Outcome: Progressing Goal: Diagnostic test results will improve Outcome: Progressing Goal: Respiratory complications will improve Outcome: Progressing Goal: Cardiovascular complication will be avoided Outcome: Progressing   Problem: Activity: Goal: Risk for activity intolerance will decrease Outcome: Progressing   Problem: Nutrition: Goal: Adequate nutrition will be maintained Outcome: Progressing   Problem: Coping: Goal: Level of anxiety will decrease Outcome: Progressing   Problem: Elimination: Goal: Will not experience complications related to bowel motility Outcome: Progressing Goal: Will not experience complications related to urinary retention Outcome: Progressing   Problem: Pain Managment: Goal: General experience of comfort will improve Outcome: Progressing   Problem: Safety: Goal: Ability to remain free from injury will improve Outcome: Progressing   Problem: Skin Integrity: Goal: Risk for impaired skin integrity will decrease Outcome: Progressing

## 2023-10-07 NOTE — NC FL2 (Addendum)
[place] MEDICAID FL2 LEVEL OF CARE FORM     IDENTIFICATION  Patient Name: Allison [name] Birthdate: [birth year] Sex: female Admission Date (Current Location): 09/25/2023  [facility] and [place] Number:  Chiropodist and Address:  [facility], [address] [place], [place] [address]      Provider Number: [id number]  Attending Physician Name and Address:  Charise Killian, MD  Relative Name and Phone Number:       Current Level of Care:  Hospital Recommended Level of Care:  (LTC) Prior Approval Number:    Date Approved/Denied:   PASRR Number: [id number]  Discharge Plan:      Current Diagnoses: Patient Active Problem List   Diagnosis Date Noted   Protein-calorie malnutrition, severe 09/29/2023   UTI (urinary tract infection) 09/26/2023   Pressure injury of skin 09/26/2023   Urinary tract infection 09/25/2023   Hypokalemia 09/25/2023   Sinus bradycardia 09/25/2023   Metabolic encephalopathy 09/25/2023   Inadequate oral intake 09/25/2023   Dementia with behavioral disturbance (HCC)     Orientation RESPIRATION BLADDER Height & Weight        Normal Incontinent Weight: 101 lb 10.1 oz (46.1 kg) Height:  5\' 6"  (167.6 cm)  BEHAVIORAL SYMPTOMS/MOOD NEUROLOGICAL BOWEL NUTRITION STATUS      Incontinent    AMBULATORY STATUS COMMUNICATION OF NEEDS Skin   Extensive Assist Verbally  (Pressure injury to hip stage 2)                       Personal Care Assistance Level of Assistance  Bathing, Feeding, Dressing Bathing Assistance: Maximum assistance Feeding assistance: Maximum assistance Dressing Assistance: Maximum assistance     Functional Limitations Info  Sight, Hearing, Speech Sight Info: Adequate Hearing Info: Adequate Speech Info: Adequate    SPECIAL CARE FACTORS FREQUENCY                       Contractures Contractures Info: Not present    Additional Factors Info  Code Status Code Status Info: DNR              Current Medications (10/07/2023):  This is the current hospital active medication list Current Facility-Administered Medications  Medication Dose Route Frequency Provider Last Rate Last Admin   acetaminophen (TYLENOL) tablet 650 mg  650 mg Oral Q6H PRN Andris Baumann, MD       Or   acetaminophen (TYLENOL) suppository 650 mg  650 mg Rectal Q6H PRN Andris Baumann, MD       atenolol (TENORMIN) tablet 50 mg  50 mg Oral Daily [name], MD   50 mg at 10/07/23 0841   donepezil (ARICEPT) tablet 10 mg  10 mg Oral Daily [name], MD   10 mg at 10/07/23 0842   enoxaparin (LOVENOX) injection 40 mg  40 mg Subcutaneous Q24H Lindajo Royal V, MD   40 mg at 10/07/23 0841   feeding supplement (ENSURE ENLIVE / ENSURE PLUS) liquid 237 mL  237 mL Oral BID BM Enedina Finner, MD   237 mL at 10/07/23 1251   FLUoxetine (PROZAC) capsule 10 mg  10 mg Oral Daily [name], MD   10 mg at 10/07/23 0841   hydrALAZINE (APRESOLINE) injection 10 mg  10 mg Intravenous Q6H PRN [name], MD   10 mg at 10/02/23 2132   levothyroxine (SYNTHROID) tablet 100 mcg  100 mcg Oral Q0600 [name], MD   100 mcg at  10/07/23 0529   lisinopril (ZESTRIL) tablet 40 mg  40 mg Oral Daily [name], MD   40 mg at 10/07/23 1610   multivitamin with minerals tablet 1 tablet  1 tablet Oral Daily [name], MD   1 tablet at 10/07/23 0842   ondansetron [facility]) tablet 4 mg  4 mg Oral Q6H PRN Andris Baumann, MD       Or   ondansetron [facility]) injection 4 mg  4 mg Intravenous Q6H PRN Andris Baumann, MD         Discharge Medications: Please see discharge summary for a list of discharge medications.  Relevant Imaging Results:  Relevant Lab Results:   Additional Information SS [id number]  Allena Katz, LCSW

## 2023-10-07 NOTE — Plan of Care (Signed)
Problem: Clinical Measurements: Goal: Will remain free from infection Outcome: Progressing Goal: Cardiovascular complication will be avoided Outcome: Progressing   Problem: Pain Managment: Goal: General experience of comfort will improve Outcome: Progressing   Problem: Safety: Goal: Ability to remain free from injury will improve Outcome: Progressing   Problem: Skin Integrity: Goal: Risk for impaired skin integrity will decrease Outcome: Progressing

## 2023-10-07 NOTE — TOC Progression Note (Addendum)
Transition of Care [facility]) - Progression Note    Patient Details  Name: Allison [name] MRN: [id number] Date of Birth: March 22, 1932  Transition of Care [facility]) CM/SW Contact  Allena Katz, LCSW Phone Number: 10/07/2023, 2:07 PM  Clinical Narrative:   Referrals sent out for LTC for patient. Message sent to [facility] at APS regarding patient.     Expected Discharge Plan:  (TBD) Barriers to Discharge: Continued Medical Work up  Expected Discharge Plan and Services       Living arrangements for the past 2 months: Single Family Home                                       Social Determinants of Health (SDOH) Interventions SDOH Screenings   Food Insecurity: Patient Unable To Answer (09/26/2023)  Housing: High Risk (09/26/2023)  Transportation Needs: Patient Unable To Answer (09/26/2023)  Utilities: Patient Unable To Answer (09/26/2023)  Tobacco Use: Unknown (10/01/2023)    Readmission Risk Interventions     No data to display

## 2023-10-08 DIAGNOSIS — F03918 Unspecified dementia, unspecified severity, with other behavioral disturbance: Secondary | ICD-10-CM

## 2023-10-08 NOTE — Progress Notes (Signed)
PROGRESS NOTE    Allison [name]  [id number] DOB: [birth year] DOA: 09/25/2023 PCP: Alan Mulder, MD   Assessment & Plan:   Principal Problem:   Urinary tract infection Active Problems:   Metabolic encephalopathy   Dementia with behavioral disturbance (HCC)   Hypokalemia   Sinus bradycardia   Inadequate oral intake   UTI (urinary tract infection)   Pressure injury of skin   Protein-calorie malnutrition, severe  Assessment and Plan:  UTI: urine cx grew e.coli & pseudomonas. Completed abx course w/ rocephin & cipro    Acute metabolic encephalopathy: re-orient prn. Continue w/ supportive care   Dementia with behavioral disturbance: haldol prn. Avoid benzos.   Sinus bradycardia: continue on reduced dose of atenolol    HTN: continue on atenolol, lisinopril. Hydralazine prn   Hypokalemia: WNL today     Severe malnutrition: continue on nutritional supplements      DVT prophylaxis: lovenox  Code Status: DNR  Family Communication: Disposition Plan: unsafe d/c plan. See CM's notes   Level of care: Med-Surg Status is: Inpatient Remains inpatient appropriate because: medically stable. Unsafe d/c plan     Consultants:    Procedures:   Antimicrobials:   Subjective: Pt is pleasantly confused   Objective: Vitals:   10/07/23 1525 10/07/23 2119 10/08/23 0543 10/08/23 0739  BP: (!) 159/82 139/86 (!) 147/87 (!) 140/91  Pulse: 70 68 (!) 59 60  Resp: 17 17 18 16   Temp:  97.6 F (36.4 C) 97.8 F (36.6 C) 98.1 F (36.7 C)  TempSrc:      SpO2: 96% 97% 100% 99%  Weight:  45.7 kg    Height:        Intake/Output Summary (Last 24 hours) at 10/08/2023 0824 Last data filed at 10/07/2023 1700 Gross per 24 hour  Intake 480 ml  Output --  Net 480 ml   Filed Weights   10/06/23 0500 10/07/23 0500 10/07/23 2119  Weight: 46 kg 46.1 kg 45.7 kg    Examination:  General exam: Appears comfortable. Cachetic appearing  Respiratory system: clear breath sounds  b/l  Cardiovascular system: S1/S2+. No rubs or clicks  Gastrointestinal system: abd is soft, NT, ND & hypoactive bowel sounds  Central nervous system: Alert and awake  Psychiatry: judgement and insight appears poor. Flat mood and affect     Data Reviewed: I have personally reviewed following labs and imaging studies  CBC: Recent Labs  Lab 10/07/23 0847  WBC 4.4  HGB 12.9  HCT 38.7  MCV 95.3  PLT 211   Basic Metabolic Panel: Recent Labs  Lab 10/02/23 0409 10/07/23 0847  NA  --  132*  K 3.9 4.0  CL  --  98  CO2  --  27  GLUCOSE  --  80  BUN  --  22  CREATININE  --  0.66  CALCIUM  --  8.4*   GFR: Estimated Creatinine Clearance: 34.4 mL/min (by C-G formula based on SCr of 0.66 mg/dL). Liver Function Tests: No results for input(s): "AST", "ALT", "ALKPHOS", "BILITOT", "PROT", "ALBUMIN" in the last 168 hours. No results for input(s): "LIPASE", "AMYLASE" in the last 168 hours. No results for input(s): "AMMONIA" in the last 168 hours. Coagulation Profile: No results for input(s): "INR", "PROTIME" in the last 168 hours. Cardiac Enzymes: No results for input(s): "CKTOTAL", "CKMB", "CKMBINDEX", "TROPONINI" in the last 168 hours. BNP (last 3 results) No results for input(s): "PROBNP" in the last 8760 hours. HbA1C: No results for input(s): "HGBA1C" in the last  72 hours. CBG: No results for input(s): "GLUCAP" in the last 168 hours. Lipid Profile: No results for input(s): "CHOL", "HDL", "LDLCALC", "TRIG", "CHOLHDL", "LDLDIRECT" in the last 72 hours. Thyroid Function Tests: No results for input(s): "TSH", "T4TOTAL", "FREET4", "T3FREE", "THYROIDAB" in the last 72 hours. Anemia Panel: No results for input(s): "VITAMINB12", "FOLATE", "FERRITIN", "TIBC", "IRON", "RETICCTPCT" in the last 72 hours. Sepsis Labs: No results for input(s): "PROCALCITON", "LATICACIDVEN" in the last 168 hours.  No results found for this or any previous visit (from the past 240 hour(s)).        Radiology Studies: No results found.      Scheduled Meds:  atenolol  50 mg Oral Daily   donepezil  10 mg Oral Daily   enoxaparin (LOVENOX) injection  40 mg Subcutaneous Q24H   feeding supplement  237 mL Oral BID BM   FLUoxetine  10 mg Oral Daily   levothyroxine  100 mcg Oral Q0600   lisinopril  40 mg Oral Daily   multivitamin with minerals  1 tablet Oral Daily   Continuous Infusions:   LOS: 12 days    Charise Killian, MD [facility] Pager [phone]  If 7PM-7AM, please contact night-coverage [email or web address] 10/08/2023, 8:24 AM

## 2023-10-09 DIAGNOSIS — F03918 Unspecified dementia, unspecified severity, with other behavioral disturbance: Secondary | ICD-10-CM | POA: Diagnosis not present

## 2023-10-09 NOTE — Progress Notes (Signed)
PROGRESS NOTE    Allison [name]  ZOX:[id number] DOB: Aug 11, 1932 DOA: 09/25/2023 PCP: Alan Mulder, MD   Assessment & Plan:   Principal Problem:   Urinary tract infection Active Problems:   Metabolic encephalopathy   Dementia with behavioral disturbance (HCC)   Hypokalemia   Sinus bradycardia   Inadequate oral intake   UTI (urinary tract infection)   Pressure injury of skin   Protein-calorie malnutrition, severe  Assessment and Plan:  UTI: urine cx grew e.coli & pseudomonas. Completed abx course w/ rocephin & cipro    Acute metabolic encephalopathy: re-orient prn. Continue w/ supportive care   Dementia with behavioral disturbance: haldol prn. Avoid benzos    Sinus bradycardia: continue on reduced dose of atenolol    HTN: continue on lisinopril, atenolol. Hydralazine prn     Hypokalemia: WNL today    Severe malnutrition: continue on nutritional supplements      DVT prophylaxis: lovenox  Code Status: DNR  Family Communication: Disposition Plan: unsafe d/c plan. See CM's notes   Level of care: Med-Surg Status is: Inpatient Remains inpatient appropriate because: medically stable. Unsafe d/c plan     Consultants:    Procedures:   Antimicrobials:   Subjective: Pt is pleasantly confused   Objective: Vitals:   10/08/23 1533 10/08/23 2142 10/09/23 0522 10/09/23 0726  BP: (!) 161/73 123/88 (!) 156/69 (!) 162/65  Pulse: (!) 55 (!) 59 (!) 56 (!) 53  Resp: 18 18  15   Temp: 97.9 F (36.6 C)  97.7 F (36.5 C) 97.8 F (36.6 C)  TempSrc:    Oral  SpO2: 90% 98% 99% 98%  Weight:      Height:        Intake/Output Summary (Last 24 hours) at 10/09/2023 0819 Last data filed at 10/08/2023 1918 Gross per 24 hour  Intake 240 ml  Output --  Net 240 ml   Filed Weights   10/07/23 0500 10/07/23 2119 10/08/23 0716  Weight: 46.1 kg 45.7 kg 44.2 kg    Examination:  General exam: appears calm & comfortable. Cachetic appearing  Respiratory system: clear  breath sounds b/l Cardiovascular system: S1/S2+. No rubs or clicks Gastrointestinal system: abd is soft, NT, ND & hypoactive bowel sounds  Central nervous system: alert & awake. Moves all extremities  Psychiatry: judgement and insight appears poor. Flat mood and affect    Data Reviewed: I have personally reviewed following labs and imaging studies  CBC: Recent Labs  Lab 10/07/23 0847  WBC 4.4  HGB 12.9  HCT 38.7  MCV 95.3  PLT 211   Basic Metabolic Panel: Recent Labs  Lab 10/07/23 0847  NA 132*  K 4.0  CL 98  CO2 27  GLUCOSE 80  BUN 22  CREATININE 0.66  CALCIUM 8.4*   GFR: Estimated Creatinine Clearance: 33.3 mL/min (by C-G formula based on SCr of 0.66 mg/dL). Liver Function Tests: No results for input(s): "AST", "ALT", "ALKPHOS", "BILITOT", "PROT", "ALBUMIN" in the last 168 hours. No results for input(s): "LIPASE", "AMYLASE" in the last 168 hours. No results for input(s): "AMMONIA" in the last 168 hours. Coagulation Profile: No results for input(s): "INR", "PROTIME" in the last 168 hours. Cardiac Enzymes: No results for input(s): "CKTOTAL", "CKMB", "CKMBINDEX", "TROPONINI" in the last 168 hours. BNP (last 3 results) No results for input(s): "PROBNP" in the last 8760 hours. HbA1C: No results for input(s): "HGBA1C" in the last 72 hours. CBG: No results for input(s): "GLUCAP" in the last 168 hours. Lipid Profile: No results for  input(s): "CHOL", "HDL", "LDLCALC", "TRIG", "CHOLHDL", "LDLDIRECT" in the last 72 hours. Thyroid Function Tests: No results for input(s): "TSH", "T4TOTAL", "FREET4", "T3FREE", "THYROIDAB" in the last 72 hours. Anemia Panel: No results for input(s): "VITAMINB12", "FOLATE", "FERRITIN", "TIBC", "IRON", "RETICCTPCT" in the last 72 hours. Sepsis Labs: No results for input(s): "PROCALCITON", "LATICACIDVEN" in the last 168 hours.  No results found for this or any previous visit (from the past 240 hour(s)).       Radiology Studies: No  results found.      Scheduled Meds:  atenolol  50 mg Oral Daily   donepezil  10 mg Oral Daily   enoxaparin (LOVENOX) injection  40 mg Subcutaneous Q24H   feeding supplement  237 mL Oral BID BM   FLUoxetine  10 mg Oral Daily   levothyroxine  100 mcg Oral Q0600   lisinopril  40 mg Oral Daily   multivitamin with minerals  1 tablet Oral Daily   Continuous Infusions:   LOS: 13 days    Charise Killian, MD [facility] Pager [phone]  If 7PM-7AM, please contact night-coverage [email or web address] 10/09/2023, 8:19 AM

## 2023-10-09 NOTE — TOC Progression Note (Signed)
Transition of Care [facility]) - Progression Note    Patient Details  Name: Allison [name] MRN: [id number] Date of Birth: May 10, 1932  Transition of Care [facility]) CM/SW Contact  Liliana Cline, LCSW Phone Number: 10/09/2023, 2:14 PM  Clinical Narrative:    Updated by [facility] Supervisor that per DSS, patient does not qualify for Medicaid, son will need to complete spend down and choose SNF for LTC for patient. Attempted call to son Kathlene November to discuss bed offers, No answer and VM full. Will continue to attempt to follow up.   Expected Discharge Plan:  (TBD) Barriers to Discharge: Continued Medical Work up  Expected Discharge Plan and Services       Living arrangements for the past 2 months: Single Family Home                                       Social Determinants of Health (SDOH) Interventions SDOH Screenings   Food Insecurity: Patient Unable To Answer (09/26/2023)  Housing: High Risk (09/26/2023)  Transportation Needs: Patient Unable To Answer (09/26/2023)  Utilities: Patient Unable To Answer (09/26/2023)  Tobacco Use: Unknown (10/01/2023)    Readmission Risk Interventions     No data to display

## 2023-10-09 NOTE — Plan of Care (Signed)
[facility] Tonsillectomy/Adenoidectomy, Postoperative PEDS care plan entered in error.

## 2023-10-10 DIAGNOSIS — F03918 Unspecified dementia, unspecified severity, with other behavioral disturbance: Secondary | ICD-10-CM | POA: Diagnosis not present

## 2023-10-10 NOTE — Progress Notes (Signed)
PROGRESS NOTE    Allison [name]  MWU:[id number] DOB: [birth year] DOA: 09/25/2023 PCP: Alan Mulder, MD   Assessment & Plan:   Principal Problem:   Urinary tract infection Active Problems:   Metabolic encephalopathy   Dementia with behavioral disturbance (HCC)   Hypokalemia   Sinus bradycardia   Inadequate oral intake   UTI (urinary tract infection)   Pressure injury of skin   Protein-calorie malnutrition, severe  Assessment and Plan:  UTI: urine cx grew e.coli & pseudomonas. Completed abx course w/ rocephin & cipro    Acute metabolic encephalopathy: re-orient prn. Continue w/ supportive care  Dementia with behavioral disturbance: haldol prn. Avoid benzos    Sinus bradycardia: continue on reduced dose of atenolol    HTN: continue on atenolol, lisinopril. Hydralazine prn    Hypokalemia: WNL today    Severe malnutrition: continue on nutritional supplements      DVT prophylaxis: lovenox  Code Status: DNR  Family Communication: Disposition Plan: unsafe d/c plan. See CM's notes   Level of care: Med-Surg Status is: Inpatient Remains inpatient appropriate because: medically stable. Unsafe d/c plan     Consultants:    Procedures:   Antimicrobials:   Subjective: Pt is pleasantly confused   Objective: Vitals:   10/09/23 1631 10/09/23 2320 10/10/23 0410 10/10/23 0749  BP: 107/86 128/72 (!) 144/68 (!) 146/86  Pulse: (!) 56 64 (!) 58 72  Resp: 15 18 18 16   Temp: 98 F (36.7 C) (!) 97.3 F (36.3 C) 97.6 F (36.4 C) 98 F (36.7 C)  TempSrc:   Oral   SpO2: 91% 100% 98% 92%  Weight:      Height:       No intake or output data in the 24 hours ending 10/10/23 0801  Filed Weights   10/07/23 0500 10/07/23 2119 10/08/23 0716  Weight: 46.1 kg 45.7 kg 44.2 kg    Examination:  General exam:appears comfortable. Cachetic appearing  Respiratory system: clear breath sounds  Cardiovascular system: S1/S2+. No rubs or clicks  Gastrointestinal system: abd  is soft, NT, ND & hypoactive bowel sounds   Central nervous system: alert & awake. Moves all extremities  Psychiatry: judgement and insight appears poor. Flat mood and affect     Data Reviewed: I have personally reviewed following labs and imaging studies  CBC: Recent Labs  Lab 10/07/23 0847  WBC 4.4  HGB 12.9  HCT 38.7  MCV 95.3  PLT 211   Basic Metabolic Panel: Recent Labs  Lab 10/07/23 0847  NA 132*  K 4.0  CL 98  CO2 27  GLUCOSE 80  BUN 22  CREATININE 0.66  CALCIUM 8.4*   GFR: Estimated Creatinine Clearance: 33.3 mL/min (by C-G formula based on SCr of 0.66 mg/dL). Liver Function Tests: No results for input(s): "AST", "ALT", "ALKPHOS", "BILITOT", "PROT", "ALBUMIN" in the last 168 hours. No results for input(s): "LIPASE", "AMYLASE" in the last 168 hours. No results for input(s): "AMMONIA" in the last 168 hours. Coagulation Profile: No results for input(s): "INR", "PROTIME" in the last 168 hours. Cardiac Enzymes: No results for input(s): "CKTOTAL", "CKMB", "CKMBINDEX", "TROPONINI" in the last 168 hours. BNP (last 3 results) No results for input(s): "PROBNP" in the last 8760 hours. HbA1C: No results for input(s): "HGBA1C" in the last 72 hours. CBG: No results for input(s): "GLUCAP" in the last 168 hours. Lipid Profile: No results for input(s): "CHOL", "HDL", "LDLCALC", "TRIG", "CHOLHDL", "LDLDIRECT" in the last 72 hours. Thyroid Function Tests: No results for input(s): "TSH", "  T4TOTAL", "FREET4", "T3FREE", "THYROIDAB" in the last 72 hours. Anemia Panel: No results for input(s): "VITAMINB12", "FOLATE", "FERRITIN", "TIBC", "IRON", "RETICCTPCT" in the last 72 hours. Sepsis Labs: No results for input(s): "PROCALCITON", "LATICACIDVEN" in the last 168 hours.  No results found for this or any previous visit (from the past 240 hour(s)).       Radiology Studies: No results found.      Scheduled Meds:  atenolol  50 mg Oral Daily   donepezil  10 mg Oral  Daily   enoxaparin (LOVENOX) injection  40 mg Subcutaneous Q24H   feeding supplement  237 mL Oral BID BM   FLUoxetine  10 mg Oral Daily   levothyroxine  100 mcg Oral Q0600   lisinopril  40 mg Oral Daily   multivitamin with minerals  1 tablet Oral Daily   Continuous Infusions:   LOS: 14 days    Charise Killian, MD [facility] Pager [phone]  If 7PM-7AM, please contact night-coverage [email or web address] 10/10/2023, 8:01 AM

## 2023-10-10 NOTE — Plan of Care (Signed)

## 2023-10-10 NOTE — TOC Progression Note (Signed)
Transition of Care [facility]) - Progression Note    Patient Details  Name: Allison [name] MRN: [id number] Date of Birth: 03-Aug-1932  Transition of Care [facility]) CM/SW Contact  Liliana Cline, LCSW Phone Number: 10/10/2023, 10:43 AM  Clinical Narrative:    CSW attempted another call to patient's son Kathlene November. No answer and VM is still full.    Expected Discharge Plan:  (TBD) Barriers to Discharge: Continued Medical Work up  Expected Discharge Plan and Services       Living arrangements for the past 2 months: Single Family Home                                       Social Determinants of Health (SDOH) Interventions SDOH Screenings   Food Insecurity: Patient Unable To Answer (09/26/2023)  Housing: High Risk (09/26/2023)  Transportation Needs: Patient Unable To Answer (09/26/2023)  Utilities: Patient Unable To Answer (09/26/2023)  Tobacco Use: Unknown (10/01/2023)    Readmission Risk Interventions     No data to display

## 2023-10-11 DIAGNOSIS — F03918 Unspecified dementia, unspecified severity, with other behavioral disturbance: Secondary | ICD-10-CM | POA: Diagnosis not present

## 2023-10-11 NOTE — Progress Notes (Signed)
PROGRESS NOTE    Allison [name]  OZD:[id number] DOB: 11-05-1932 DOA: 09/25/2023 PCP: Alan Mulder, MD   Assessment & Plan:   Principal Problem:   Urinary tract infection Active Problems:   Metabolic encephalopathy   Dementia with behavioral disturbance (HCC)   Hypokalemia   Sinus bradycardia   Inadequate oral intake   UTI (urinary tract infection)   Pressure injury of skin   Protein-calorie malnutrition, severe  Assessment and Plan:  UTI: urine cx grew e.coli & pseudomonas. Completed abx course w/ rocephin & cipro    Acute metabolic encephalopathy: re-orient prn. Continue w/ supportive care   Dementia with behavioral disturbance: haldol prn. Avoid benzos    Sinus bradycardia: continue on reduced dose of atenolol    HTN: continue on lisinopril, atenolol. Hydralazine prn    Hypokalemia: WNL today    Severe malnutrition: continue on nutritional supplements      DVT prophylaxis: lovenox  Code Status: DNR  Family Communication: Disposition Plan: unsafe d/c plan. See CM's notes   Level of care: Med-Surg Status is: Inpatient Remains inpatient appropriate because: medically stable. Unsafe d/c plane     Consultants:    Procedures:   Antimicrobials:   Subjective: Pt is pleasantly confused   Objective: Vitals:   10/10/23 1724 10/10/23 2050 10/11/23 0428 10/11/23 0756  BP: 118/65 125/77 (!) 143/71 131/64  Pulse: 67  (!) 57 60  Resp: 16 15 16 16   Temp: 98.1 F (36.7 C)  97.8 F (36.6 C) 98.2 F (36.8 C)  TempSrc:   Oral   SpO2: 98% 97% 99% 100%  Weight:      Height:        Intake/Output Summary (Last 24 hours) at 10/11/2023 0757 Last data filed at 10/10/2023 1500 Gross per 24 hour  Intake 120 ml  Output --  Net 120 ml    Filed Weights   10/07/23 0500 10/07/23 2119 10/08/23 0716  Weight: 46.1 kg 45.7 kg 44.2 kg    Examination:  General exam: appears comfortable. Cachetic appearing  Respiratory system: clear breath sounds b/l   Cardiovascular system: S1/S2+. No rubs or clicks  Gastrointestinal system: abd is soft, NT, ND & hypoactive bowel sounds  Central nervous system: alert & awake. Moves all extremities  Psychiatry: judgement and insight appears poor. Flat mood and affect     Data Reviewed: I have personally reviewed following labs and imaging studies  CBC: Recent Labs  Lab 10/07/23 0847  WBC 4.4  HGB 12.9  HCT 38.7  MCV 95.3  PLT 211   Basic Metabolic Panel: Recent Labs  Lab 10/07/23 0847  NA 132*  K 4.0  CL 98  CO2 27  GLUCOSE 80  BUN 22  CREATININE 0.66  CALCIUM 8.4*   GFR: Estimated Creatinine Clearance: 33.3 mL/min (by C-G formula based on SCr of 0.66 mg/dL). Liver Function Tests: No results for input(s): "AST", "ALT", "ALKPHOS", "BILITOT", "PROT", "ALBUMIN" in the last 168 hours. No results for input(s): "LIPASE", "AMYLASE" in the last 168 hours. No results for input(s): "AMMONIA" in the last 168 hours. Coagulation Profile: No results for input(s): "INR", "PROTIME" in the last 168 hours. Cardiac Enzymes: No results for input(s): "CKTOTAL", "CKMB", "CKMBINDEX", "TROPONINI" in the last 168 hours. BNP (last 3 results) No results for input(s): "PROBNP" in the last 8760 hours. HbA1C: No results for input(s): "HGBA1C" in the last 72 hours. CBG: No results for input(s): "GLUCAP" in the last 168 hours. Lipid Profile: No results for input(s): "CHOL", "HDL", "LDLCALC", "  TRIG", "CHOLHDL", "LDLDIRECT" in the last 72 hours. Thyroid Function Tests: No results for input(s): "TSH", "T4TOTAL", "FREET4", "T3FREE", "THYROIDAB" in the last 72 hours. Anemia Panel: No results for input(s): "VITAMINB12", "FOLATE", "FERRITIN", "TIBC", "IRON", "RETICCTPCT" in the last 72 hours. Sepsis Labs: No results for input(s): "PROCALCITON", "LATICACIDVEN" in the last 168 hours.  No results found for this or any previous visit (from the past 240 hour(s)).       Radiology Studies: No results  found.      Scheduled Meds:  atenolol  50 mg Oral Daily   donepezil  10 mg Oral Daily   enoxaparin (LOVENOX) injection  40 mg Subcutaneous Q24H   feeding supplement  237 mL Oral BID BM   FLUoxetine  10 mg Oral Daily   levothyroxine  100 mcg Oral Q0600   lisinopril  40 mg Oral Daily   multivitamin with minerals  1 tablet Oral Daily   Continuous Infusions:   LOS: 15 days    Charise Killian, MD [facility] Pager [phone]  If 7PM-7AM, please contact night-coverage [email or web address] 10/11/2023, 7:57 AM

## 2023-10-11 NOTE — TOC Progression Note (Signed)
Transition of Care [facility]) - Progression Note    Patient Details  Name: Allison [name] MRN: [id number] Date of Birth: Nov 08, 1932  [facility] CM/SW Contact  Liliana Cline, LCSW Phone Number: 10/11/2023, 9:30 AM  Clinical Narrative:    Sent encrypted email to [facility] with DSS and included weekday CSW and [facility] Supervisor - informed her that CSW has been unsuccessful at reaching the son.   Expected Discharge Plan:  (TBD) Barriers to Discharge: Continued Medical Work up  Expected Discharge Plan and Services       Living arrangements for the past 2 months: Single Family Home                                       Social Determinants of Health (SDOH) Interventions SDOH Screenings   Food Insecurity: Patient Unable To Answer (09/26/2023)  Housing: High Risk (09/26/2023)  Transportation Needs: Patient Unable To Answer (09/26/2023)  Utilities: Patient Unable To Answer (09/26/2023)  Tobacco Use: Unknown (10/01/2023)    Readmission Risk Interventions     No data to display

## 2023-10-11 NOTE — Plan of Care (Signed)
  Problem: Clinical Measurements: Goal: Ability to maintain clinical measurements within normal limits will improve Outcome: Progressing Goal: Will remain free from infection Outcome: Progressing Goal: Diagnostic test results will improve Outcome: Progressing Goal: Respiratory complications will improve Outcome: Progressing Goal: Cardiovascular complication will be avoided Outcome: Progressing   Problem: Coping: Goal: Level of anxiety will decrease Outcome: Progressing   Problem: Elimination: Goal: Will not experience complications related to bowel motility Outcome: Progressing Goal: Will not experience complications related to urinary retention Outcome: Progressing   Problem: Pain Managment: Goal: General experience of comfort will improve Outcome: Progressing   Problem: Safety: Goal: Ability to remain free from injury will improve Outcome: Progressing   Problem: Skin Integrity: Goal: Risk for impaired skin integrity will decrease Outcome: Progressing   Problem: Activity: Goal: Risk for activity intolerance will decrease Outcome: Not Progressing   Problem: Nutrition: Goal: Adequate nutrition will be maintained Outcome: Not Progressing   Problem: Education: Goal: Knowledge of General Education information will improve Description: Including pain rating scale, medication(s)/side effects and non-pharmacologic comfort measures Outcome: Not Applicable   Problem: Health Behavior/Discharge Planning: Goal: Ability to manage health-related needs will improve Outcome: Not Applicable   Problem: Clinical Measurements: Goal: Ability to maintain clinical measurements within normal limits will improve Outcome: Progressing Goal: Will remain free from infection Outcome: Progressing Goal: Diagnostic test results will improve Outcome: Progressing Goal: Respiratory complications will improve Outcome: Progressing Goal: Cardiovascular complication will be avoided Outcome:  Progressing   Problem: Coping: Goal: Level of anxiety will decrease Outcome: Progressing   Problem: Elimination: Goal: Will not experience complications related to bowel motility Outcome: Progressing Goal: Will not experience complications related to urinary retention Outcome: Progressing   Problem: Pain Managment: Goal: General experience of comfort will improve Outcome: Progressing   Problem: Safety: Goal: Ability to remain free from injury will improve Outcome: Progressing   Problem: Skin Integrity: Goal: Risk for impaired skin integrity will decrease Outcome: Progressing

## 2023-10-12 ENCOUNTER — Encounter: Payer: Self-pay | Admitting: Hospitalist

## 2023-10-12 DIAGNOSIS — F03918 Unspecified dementia, unspecified severity, with other behavioral disturbance: Secondary | ICD-10-CM | POA: Diagnosis not present

## 2023-10-12 NOTE — Progress Notes (Signed)
PROGRESS NOTE    Allison [name]  WUJ:[id number] DOB: 03/25/1932 DOA: 09/25/2023 PCP: Alan Mulder, MD   Assessment & Plan:   Principal Problem:   Urinary tract infection Active Problems:   Metabolic encephalopathy   Dementia with behavioral disturbance (HCC)   Hypokalemia   Sinus bradycardia   Inadequate oral intake   UTI (urinary tract infection)   Pressure injury of skin   Protein-calorie malnutrition, severe  Assessment and Plan:  UTI: urine cx grew e.coli & pseudomonas. Completed abx course w/ rocephin & cipro    Acute metabolic encephalopathy: re-orient prn. Continue w/ supportive care   Dementia with behavioral disturbance: haldol prn. Avoid benzos    Sinus bradycardia: continue on reduced dose of atenolol    HTN: continue on atenolol, lisinopril. Hydralazine prn    Hypokalemia: will monitor intermittently    Severe malnutrition: continue on nutritional supplements      DVT prophylaxis: lovenox  Code Status: DNR  Family Communication: Disposition Plan: unsafe d/c plan. See CM's notes   Level of care: Med-Surg Status is: Inpatient Remains inpatient appropriate because: medically stable. Unsafe d/c plane     Consultants:    Procedures:   Antimicrobials:   Subjective: Pt is pleasantly confused    Objective: Vitals:   10/11/23 0756 10/11/23 1550 10/11/23 2114 10/12/23 0442  BP: 131/64 (!) 159/100 (!) 163/90 (!) 172/83  Pulse: 60 (!) 57 64 (!) 58  Resp: 16 16 18    Temp: 98.2 F (36.8 C) (!) 97.5 F (36.4 C) 97.7 F (36.5 C)   TempSrc:   Oral   SpO2: 100% 100% 99% 100%  Weight:      Height:       No intake or output data in the 24 hours ending 10/12/23 0810   Filed Weights   10/07/23 0500 10/07/23 2119 10/08/23 0716  Weight: 46.1 kg 45.7 kg 44.2 kg    Examination:  General exam: appears calm. Cachetic appearing   Respiratory system: clear breath sounds b/l  Cardiovascular system: S1/S2+. No rubs or clicks   Gastrointestinal system: abd is soft, NT, ND & hypoactive bowel sounds  Central nervous system: appears sleepy. Moves all extremities  Psychiatry: judgement and insight appears poor. Flat mood and affect    Data Reviewed: I have personally reviewed following labs and imaging studies  CBC: Recent Labs  Lab 10/07/23 0847  WBC 4.4  HGB 12.9  HCT 38.7  MCV 95.3  PLT 211   Basic Metabolic Panel: Recent Labs  Lab 10/07/23 0847  NA 132*  K 4.0  CL 98  CO2 27  GLUCOSE 80  BUN 22  CREATININE 0.66  CALCIUM 8.4*   GFR: Estimated Creatinine Clearance: 33.3 mL/min (by C-G formula based on SCr of 0.66 mg/dL). Liver Function Tests: No results for input(s): "AST", "ALT", "ALKPHOS", "BILITOT", "PROT", "ALBUMIN" in the last 168 hours. No results for input(s): "LIPASE", "AMYLASE" in the last 168 hours. No results for input(s): "AMMONIA" in the last 168 hours. Coagulation Profile: No results for input(s): "INR", "PROTIME" in the last 168 hours. Cardiac Enzymes: No results for input(s): "CKTOTAL", "CKMB", "CKMBINDEX", "TROPONINI" in the last 168 hours. BNP (last 3 results) No results for input(s): "PROBNP" in the last 8760 hours. HbA1C: No results for input(s): "HGBA1C" in the last 72 hours. CBG: No results for input(s): "GLUCAP" in the last 168 hours. Lipid Profile: No results for input(s): "CHOL", "HDL", "LDLCALC", "TRIG", "CHOLHDL", "LDLDIRECT" in the last 72 hours. Thyroid Function Tests: No results for  input(s): "TSH", "T4TOTAL", "FREET4", "T3FREE", "THYROIDAB" in the last 72 hours. Anemia Panel: No results for input(s): "VITAMINB12", "FOLATE", "FERRITIN", "TIBC", "IRON", "RETICCTPCT" in the last 72 hours. Sepsis Labs: No results for input(s): "PROCALCITON", "LATICACIDVEN" in the last 168 hours.  No results found for this or any previous visit (from the past 240 hour(s)).       Radiology Studies: No results found.      Scheduled Meds:  atenolol  50 mg Oral  Daily   donepezil  10 mg Oral Daily   enoxaparin (LOVENOX) injection  40 mg Subcutaneous Q24H   feeding supplement  237 mL Oral BID BM   FLUoxetine  10 mg Oral Daily   levothyroxine  100 mcg Oral Q0600   lisinopril  40 mg Oral Daily   multivitamin with minerals  1 tablet Oral Daily   Continuous Infusions:   LOS: 16 days    Charise Killian, MD [facility] Pager [phone]  If 7PM-7AM, please contact night-coverage [email or web address] 10/12/2023, 8:10 AM

## 2023-10-13 DIAGNOSIS — F03918 Unspecified dementia, unspecified severity, with other behavioral disturbance: Secondary | ICD-10-CM | POA: Diagnosis not present

## 2023-10-13 LAB — BASIC METABOLIC PANEL
Anion gap: 8 (ref 5–15)
BUN: 17 mg/dL (ref 8–23)
CO2: 29 mmol/L (ref 22–32)
Calcium: 8.6 mg/dL — ABNORMAL LOW (ref 8.9–10.3)
Chloride: 98 mmol/L (ref 98–111)
Creatinine, Ser: 0.68 mg/dL (ref 0.44–1.00)
GFR, Estimated: >60 mL/min (ref >60)
Glucose, Bld: 140 mg/dL — ABNORMAL HIGH (ref 70–99)
Potassium: 4.1 mmol/L (ref 3.5–5.1)
Sodium: 135 mmol/L (ref 135–145)

## 2023-10-13 LAB — CBC
HCT: 40 % (ref 36.0–46.0)
Hemoglobin: 13.4 g/dL (ref 12.0–15.0)
MCH: 32.4 pg (ref 26.0–34.0)
MCHC: 33.5 g/dL (ref 30.0–36.0)
MCV: 96.9 fL (ref 80.0–100.0)
Platelets: 337 10*3/uL (ref 150–400)
RBC: 4.13 MIL/uL (ref 3.87–5.11)
RDW: 15.4 % (ref 11.5–15.5)
WBC: 4.2 10*3/uL (ref 4.0–10.5)
nRBC: 0 % (ref 0.0–0.2)

## 2023-10-13 NOTE — Plan of Care (Signed)

## 2023-10-13 NOTE — Plan of Care (Signed)
  Problem: Clinical Measurements: Goal: Ability to maintain clinical measurements within normal limits will improve Outcome: Not Progressing Goal: Will remain free from infection Outcome: Not Progressing Goal: Diagnostic test results will improve Outcome: Not Progressing Goal: Respiratory complications will improve Outcome: Not Progressing Goal: Cardiovascular complication will be avoided Outcome: Not Progressing   Problem: Activity: Goal: Risk for activity intolerance will decrease Outcome: Not Progressing   Problem: Nutrition: Goal: Adequate nutrition will be maintained Outcome: Not Progressing   Problem: Coping: Goal: Level of anxiety will decrease Outcome: Not Progressing   Problem: Elimination: Goal: Will not experience complications related to bowel motility Outcome: Not Progressing Goal: Will not experience complications related to urinary retention Outcome: Not Progressing   Problem: Pain Managment: Goal: General experience of comfort will improve Outcome: Not Progressing   Problem: Skin Integrity: Goal: Risk for impaired skin integrity will decrease Outcome: Not Progressing

## 2023-10-13 NOTE — Progress Notes (Signed)
PROGRESS NOTE   HPI was taken from Dr. Para March: Allison [name] is a 89 y.o. female with medical history significant for Dementia, hypertension, previously at [facility], now on home hospice, sent in by hospice via EMS as and APS case.  Hospice staff reported patient has very little oral intake and has not been taking any medications. Patient has baseline dementia with occasional aggression.  Patient did exhibit aggression in the emergency room, hitting out at staff and was administered Ativan.  Spoke with son on the phone Allison [name] who stated that for the past 3 weeks patient has been sleeping most of the day and eating very little.  States she came home from [place] back in July and she was able to ambulate independently then.  Currently ambulates very little. ED course and Data review: bradycardia in the 50s but otherwise normal vitals. Labs urinalysis consistent with UTI with positive nitrites moderate leuks and many bacteria.  WBC normal. Potassium 3 sodium 132, total bili 1.8 anion gap 18 with normal bicarb of 24 normal blood glucose 79. Started on Rocephin Hospitalist consulted for admission.   As per Dr. Mayford Knife 10/16-10/22/24: Pt has remained medically stable this week. Unsafe d/c plan still. See CM's notes.     Allison [name]  [id number] DOB: 1932-04-24 DOA: 09/25/2023 PCP: Alan Mulder, MD   Assessment & Plan:   Principal Problem:   Urinary tract infection Active Problems:   Metabolic encephalopathy   Dementia with behavioral disturbance (HCC)   Hypokalemia   Sinus bradycardia   Inadequate oral intake   UTI (urinary tract infection)   Pressure injury of skin   Protein-calorie malnutrition, severe  Assessment and Plan:  UTI: urine cx grew e.coli & pseudomonas. Completed abx course w/ rocephin & cipro    Acute metabolic encephalopathy: re-orient prn. Continue w/ supportive care   Dementia with behavioral disturbance: haldol prn. Avoid benzos     Sinus bradycardia: continue on reduced dose of atenolol    HTN: continue on lisinopril, atenolol. Hydralazine prn    Hypokalemia: WNL today. Monitor intermittently    Severe malnutrition: continue on nutritional supplements      DVT prophylaxis: lovenox  Code Status: DNR  Family Communication: Disposition Plan: unsafe d/c plan. See CM's notes   Level of care: Med-Surg Status is: Inpatient Remains inpatient appropriate because: medically stable. Unsafe d/c plan     Consultants:    Procedures:   Antimicrobials:   Subjective: Pt is pleasantly confused   Objective: Vitals:   10/12/23 0828 10/12/23 1731 10/12/23 1941 10/13/23 0457  BP: (!) 145/79 (!) 173/73 (!) 161/69 (!) 172/73  Pulse: 75 60 (!) 55 (!) 54  Resp: 16 18    Temp: (!) 97.4 F (36.3 C) (!) 97.5 F (36.4 C) 98.1 F (36.7 C)   TempSrc:      SpO2: 100% 99% 98% 92%  Weight:      Height:        Intake/Output Summary (Last 24 hours) at 10/13/2023 4742 Last data filed at 10/12/2023 1500 Gross per 24 hour  Intake 100 ml  Output --  Net 100 ml     Filed Weights   10/07/23 0500 10/07/23 2119 10/08/23 0716  Weight: 46.1 kg 45.7 kg 44.2 kg    Examination:  General exam: appears comfortable. Cachetic appearing  Respiratory system: clear breath sounds b/l  Cardiovascular system: S1/S2+. No rubs or clicks  Gastrointestinal system: abd is soft, NT, ND & hypoactive bowel sounds  Central  nervous system: appears sleepy. Moves all extremities  Psychiatry: judgement and insight appears poor. Flat mood and affect    Data Reviewed: I have personally reviewed following labs and imaging studies  CBC: Recent Labs  Lab 10/07/23 0847  WBC 4.4  HGB 12.9  HCT 38.7  MCV 95.3  PLT 211   Basic Metabolic Panel: Recent Labs  Lab 10/07/23 0847  NA 132*  K 4.0  CL 98  CO2 27  GLUCOSE 80  BUN 22  CREATININE 0.66  CALCIUM 8.4*   GFR: Estimated Creatinine Clearance: 33.3 mL/min (by C-G formula  based on SCr of 0.66 mg/dL). Liver Function Tests: No results for input(s): "AST", "ALT", "ALKPHOS", "BILITOT", "PROT", "ALBUMIN" in the last 168 hours. No results for input(s): "LIPASE", "AMYLASE" in the last 168 hours. No results for input(s): "AMMONIA" in the last 168 hours. Coagulation Profile: No results for input(s): "INR", "PROTIME" in the last 168 hours. Cardiac Enzymes: No results for input(s): "CKTOTAL", "CKMB", "CKMBINDEX", "TROPONINI" in the last 168 hours. BNP (last 3 results) No results for input(s): "PROBNP" in the last 8760 hours. HbA1C: No results for input(s): "HGBA1C" in the last 72 hours. CBG: No results for input(s): "GLUCAP" in the last 168 hours. Lipid Profile: No results for input(s): "CHOL", "HDL", "LDLCALC", "TRIG", "CHOLHDL", "LDLDIRECT" in the last 72 hours. Thyroid Function Tests: No results for input(s): "TSH", "T4TOTAL", "FREET4", "T3FREE", "THYROIDAB" in the last 72 hours. Anemia Panel: No results for input(s): "VITAMINB12", "FOLATE", "FERRITIN", "TIBC", "IRON", "RETICCTPCT" in the last 72 hours. Sepsis Labs: No results for input(s): "PROCALCITON", "LATICACIDVEN" in the last 168 hours.  No results found for this or any previous visit (from the past 240 hour(s)).       Radiology Studies: No results found.      Scheduled Meds:  atenolol  50 mg Oral Daily   donepezil  10 mg Oral Daily   enoxaparin (LOVENOX) injection  40 mg Subcutaneous Q24H   feeding supplement  237 mL Oral BID BM   FLUoxetine  10 mg Oral Daily   levothyroxine  100 mcg Oral Q0600   lisinopril  40 mg Oral Daily   multivitamin with minerals  1 tablet Oral Daily   Continuous Infusions:   LOS: 17 days    Charise Killian, MD [facility] Pager [phone]  If 7PM-7AM, please contact night-coverage [email or web address] 10/13/2023, 8:12 AM

## 2023-10-14 DIAGNOSIS — N3000 Acute cystitis without hematuria: Secondary | ICD-10-CM | POA: Diagnosis not present

## 2023-10-14 NOTE — Progress Notes (Signed)
Nutrition Follow-up  DOCUMENTATION CODES:   Severe malnutrition in context of social or environmental circumstances  INTERVENTION:   -Continue Ensure Enlive po BID, each supplement provides 350 kcal and 20 grams of protein.  -Continue Magic cup TID with meals, each supplement provides 290 kcal and 9 grams of protein  -Continue dysphagia 3 diet  -Continue MVI with minerals daily -RD will sign off secondary to medical stability; if further nutrition-related needs arise, please re-consult RD   NUTRITION DIAGNOSIS:   Severe Malnutrition related to social / environmental circumstances as evidenced by severe fat depletion, severe muscle depletion, percent weight loss.  Ongoing  GOAL:   Patient will meet greater than or equal to 90% of their needs  Progressing   MONITOR:   PO intake, Supplement acceptance, Labs, Weight trends, I & O's, Skin  REASON FOR ASSESSMENT:   Consult Assessment of nutrition requirement/status  ASSESSMENT:   89 y/o female with h/o dementia, bradycardia, asthma, HTN and followed by hospice who is admitted with UTI and AMS.  Reviewed I/O's: +140 ml x 24 hours and +3 L since 09/30/23   Pt able to feed herself per RN report. Pt drinking most of Ensure supplements offered. Pt with good oral intake. Noted meal completions 50-100%.   Wt has been stable over the past week and since admission.   Medications reviewed and include lovenox.   Per TOC notes, DSS in the process of guardianship and plans to submit to [facility] for LTC payor source. Per MD, pt remains medically stable for discharge.   Labs reviewed.    Diet Order:   Diet Order             DIET DYS 3 Room service appropriate? No; Fluid consistency: Thin  Diet effective now                   EDUCATION NEEDS:   No education needs have been identified at this time  Skin:  Skin Assessment: Skin Integrity Issues: Skin Integrity Issues:: Stage II Stage II: lt hip Stage III: n/a  Last BM:   10/14/23 (type 4)  Height:   Ht Readings from Last 1 Encounters:  09/29/23 5\' 6"  (1.676 m)    Weight:   Wt Readings from Last 1 Encounters:  10/08/23 44.2 kg    Ideal Body Weight:  59 kg  BMI:  Body mass index is 15.73 kg/m.  Estimated Nutritional Needs:   Kcal:  1400-1600  Protein:  70-85 grams  Fluid:  > 1.4 L    Levada Schilling, RD, LDN, CDCES Registered Dietitian III Certified Diabetes Care and Education Specialist Please refer to [facility] for RD and/or RD on-call/weekend/after hours pager

## 2023-10-14 NOTE — Progress Notes (Signed)
  PROGRESS NOTE    Allison [name]  [id number] DOB: 05-01-1932 DOA: 09/25/2023 PCP: Alan Mulder, MD  113A/113A-AA  LOS: 18 days   Brief hospital course:   Assessment & Plan: Allison [name] is a 89 y.o. female with medical history significant for Dementia, hypertension, previously at [facility], now on home hospice, sent in by hospice via EMS as APS case.     Hospice staff reported patient has very little oral intake and has not been taking any medications. Patient has baseline dementia with occasional aggression.  Patient did exhibit aggression in the emergency room, hitting out at staff and was administered Ativan.  Spoke with son on the phone Allison [name] who stated that for the past 3 weeks patient has been sleeping most of the day and eating very little.  States she came home from [place] back in July and she was able to ambulate independently then.  Currently ambulates very little.     Urinary tract infection --UA with pos nitrite, mod leuk and many bacteria. --Urine culture has E. coli and Pseudomonas.  Received 3 days of ceftriaxone followed by 7 days of Cipro.   Acute metabolic encephalopathy Dementia with behavioral disturbance Patient presents with lethargy, agitation, altered mental status beyond baseline, possibly related to underlying UTI --Haldol PRN --avoid benzos   Sinus bradycardia Heart rate in the 50s--now improved to 60-71 --cont home atenolol at reduced 50 mg daily   HTN --clonidine patch listed on home med, however, nursing did not see a patch on pt. --cont Lisinopril --cont home atenolol at reduced 50 mg daily --IV hydralazine PRN   Hypokalemia --monitor intermittently and supplement PRN   Inadequate oral intake Nutrition Status: Nutrition Problem: Severe Malnutrition Etiology: social / environmental circumstances Signs/Symptoms: severe fat depletion, severe muscle depletion, percent weight loss Percent weight loss: 25 %     DVT prophylaxis: Lovenox SQ Code Status: DNR  Family Communication:  Level of care: Med-Surg Dispo:   The patient is from: home Anticipated d/c is to: to be determined Anticipated d/c date is: to be determined   Subjective and Interval History:  Pt opened eyes to command, but did not interact further.   Objective: Vitals:   10/14/23 0853 10/14/23 1000 10/14/23 1545 10/14/23 2008  BP: (!) 141/109 (!) 130/90 (!) 163/91 (!) 123/112  Pulse: 75  65 70  Resp: 17  16 18   Temp: 98.8 F (37.1 C)  99 F (37.2 C) 98.4 F (36.9 C)  TempSrc:      SpO2: 97%  100% 99%  Weight:      Height:       No intake or output data in the 24 hours ending 10/14/23 2043 Filed Weights   10/07/23 0500 10/07/23 2119 10/08/23 0716  Weight: 46.1 kg 45.7 kg 44.2 kg    Examination:   Constitutional: NAD, sleeping but easily arousable HEENT: conjunctivae and lids normal, EOMI CV: No cyanosis.   RESP: normal respiratory effort, on RA   Data Reviewed: I have personally reviewed labs and imaging studies  Time spent: 25 minutes  [name], MD [facility] If 7PM-7AM, please contact night-coverage 10/14/2023, 8:43 PM

## 2023-10-14 NOTE — Plan of Care (Signed)

## 2023-10-15 DIAGNOSIS — N3000 Acute cystitis without hematuria: Secondary | ICD-10-CM | POA: Diagnosis not present

## 2023-10-15 NOTE — Plan of Care (Signed)

## 2023-10-15 NOTE — Progress Notes (Signed)
  PROGRESS NOTE    [name]  [id number] DOB: February 15, 1932 DOA: 09/25/2023 PCP: Alan Mulder, MD  113A/113A-AA  LOS: 19 days   Brief hospital course:   Assessment & Plan: Allison [name] is a 89 y.o. female with medical history significant for Dementia, hypertension, previously at [facility], now on home hospice, sent in by hospice via EMS as APS case.     Hospice staff reported patient has very little oral intake and has not been taking any medications. Patient has baseline dementia with occasional aggression.  Patient did exhibit aggression in the emergency room, hitting out at staff and was administered Ativan.  Spoke with son on the phone Allison [name] who stated that for the past 3 weeks patient has been sleeping most of the day and eating very little.  States she came home from [place] back in July and she was able to ambulate independently then.  Currently ambulates very little.     Urinary tract infection --UA with pos nitrite, mod leuk and many bacteria. --Urine culture has E. coli and Pseudomonas.  Received 3 days of ceftriaxone followed by 7 days of Cipro.   Acute metabolic encephalopathy Dementia with behavioral disturbance Patient presents with lethargy, agitation, altered mental status beyond baseline, possibly related to underlying UTI --Haldol PRN --avoid benzos   Sinus bradycardia Heart rate in the 50s--now improved to 60-71 --cont home atenolol at reduced 50 mg daily   HTN --clonidine patch listed on home med, however, nursing did not see a patch on pt. --cont Lisinopril --cont home atenolol at reduced 50 mg daily --IV hydralazine PRN   Hypokalemia --monitor intermittently and supplement PRN   Inadequate oral intake Nutrition Status: Nutrition Problem: Severe Malnutrition Etiology: social / environmental circumstances Signs/Symptoms: severe fat depletion, severe muscle depletion, percent weight loss Percent weight loss: 25 %     DVT prophylaxis: Lovenox SQ Code Status: DNR  Family Communication:  Level of care: Med-Surg Dispo:   The patient is from: home Anticipated d/c is to: to be determined Anticipated d/c date is: to be determined   Subjective and Interval History:  Pt woke up to answer "yes" with a smile to the question whether she wanted to eat her meal.   Objective: Vitals:   10/14/23 2008 10/15/23 0428 10/15/23 0744 10/15/23 1553  BP: (!) 123/112 (!) 162/129 (!) 180/82 (!) 183/82  Pulse: 70 (!) 54 (!) 55   Resp: 18  17 17   Temp: 98.4 F (36.9 C) 98.2 F (36.8 C) 98.7 F (37.1 C) 98.8 F (37.1 C)  TempSrc:    Axillary  SpO2: 99% 98%    Weight:      Height:       No intake or output data in the 24 hours ending 10/15/23 2012 Filed Weights   10/07/23 0500 10/07/23 2119 10/08/23 0716  Weight: 46.1 kg 45.7 kg 44.2 kg    Examination:   Constitutional: NAD, alert HEENT: conjunctivae and lids normal, EOMI CV: No cyanosis.   RESP: normal respiratory effort, on RA   Data Reviewed: I have personally reviewed labs and imaging studies  Time spent: 25 minutes  [name], MD [facility] If 7PM-7AM, please contact night-coverage 10/15/2023, 8:12 PM

## 2023-10-15 NOTE — TOC Progression Note (Signed)
Transition of Care [facility]) - Progression Note    Patient Details  Name: Allison [name] MRN: [id number] Date of Birth: Jan 17, 1932  Transition of Care [facility]) CM/SW Contact  Garret Reddish, RN Phone Number: 10/15/2023, 3:17 PM  Clinical Narrative:    Voice message left for Supervisor Milinda Pointer to receive an update on patient's case.    TOC will continue to follow for discharge planning.     Expected Discharge Plan:  (TBD) Barriers to Discharge: Continued Medical Work up  Expected Discharge Plan and Services       Living arrangements for the past 2 months: Single Family Home                                       Social Determinants of Health (SDOH) Interventions SDOH Screenings   Food Insecurity: Patient Unable To Answer (09/26/2023)  Housing: High Risk (09/26/2023)  Transportation Needs: Patient Unable To Answer (09/26/2023)  Utilities: Patient Unable To Answer (09/26/2023)  Tobacco Use: Low Risk  (10/12/2023)    Readmission Risk Interventions     No data to display

## 2023-10-16 DIAGNOSIS — N3000 Acute cystitis without hematuria: Secondary | ICD-10-CM

## 2023-10-16 NOTE — Plan of Care (Signed)

## 2023-10-16 NOTE — Progress Notes (Signed)
  PROGRESS NOTE    Allison [name]  [id number] DOB: Jul 20, 1932 DOA: 09/25/2023 PCP: Alan Mulder, MD  113A/113A-AA  LOS: 20 days   Brief hospital course:   Assessment & Plan: GAMILA WONDERS is a 89 y.o. female with medical history significant for Dementia, hypertension, previously at [facility], now on home hospice, sent in by hospice via EMS as APS case.     Hospice staff reported patient has very little oral intake and has not been taking any medications. Patient has baseline dementia with occasional aggression.  Patient did exhibit aggression in the emergency room, hitting out at staff and was administered Ativan.  Spoke with son on the phone Allison [name] who stated that for the past 3 weeks patient has been sleeping most of the day and eating very little.  States she came home from [place] back in July and she was able to ambulate independently then.  Currently ambulates very little.     Urinary tract infection --UA with pos nitrite, mod leuk and many bacteria. --Urine culture has E. coli and Pseudomonas.  Received 3 days of ceftriaxone followed by 7 days of Cipro.   Acute metabolic encephalopathy Dementia with behavioral disturbance Patient presents with lethargy, agitation, altered mental status beyond baseline, possibly related to underlying UTI --Haldol PRN --avoid benzos   Sinus bradycardia Heart rate in the 50s--now improved to 60-71 --cont home atenolol at reduced 50 mg daily   HTN --clonidine patch listed on home med, however, nursing did not see a patch on pt. --cont Lisinopril --cont home atenolol at reduced 50 mg daily --IV hydralazine PRN   Hypokalemia --monitor intermittently and supplement PRN   Inadequate oral intake Nutrition Status: Nutrition Problem: Severe Malnutrition Etiology: social / environmental circumstances Signs/Symptoms: severe fat depletion, severe muscle depletion, percent weight loss Percent weight loss: 25 %     DVT prophylaxis: Lovenox SQ Code Status: DNR  Family Communication:  Level of care: Med-Surg Dispo:   The patient is from: home Anticipated d/c is to: to be determined by DSS. Anticipated d/c date is: to be determined   Subjective and Interval History:  Nursing reported pt eating well when fed.  Pt said she liked the food.   Objective: Vitals:   10/16/23 0544 10/16/23 0743 10/16/23 0900 10/16/23 1618  BP: (!) 124/110 103/84  (!) 184/66  Pulse:   60 (!) 54  Resp: 18 18  20   Temp: 97.8 F (36.6 C) 97.7 F (36.5 C)  (!) 97.3 F (36.3 C)  TempSrc:  Axillary    SpO2:      Weight:      Height:       No intake or output data in the 24 hours ending 10/16/23 1757 Filed Weights   10/07/23 0500 10/07/23 2119 10/08/23 0716  Weight: 46.1 kg 45.7 kg 44.2 kg    Examination:   Constitutional: NAD, alert, not oriented HEENT: conjunctivae and lids normal, EOMI CV: No cyanosis.   RESP: normal respiratory effort, on RA Neuro: II - XII grossly intact.     Data Reviewed: I have personally reviewed labs and imaging studies  Time spent: 25 minutes  [name], MD [facility] If 7PM-7AM, please contact night-coverage 10/16/2023, 5:57 PM

## 2023-10-17 DIAGNOSIS — N3000 Acute cystitis without hematuria: Secondary | ICD-10-CM | POA: Diagnosis not present

## 2023-10-17 LAB — CBC
HCT: 33.6 % — ABNORMAL LOW (ref 36.0–46.0)
Hemoglobin: 11.5 g/dL — ABNORMAL LOW (ref 12.0–15.0)
MCH: 32.7 pg (ref 26.0–34.0)
MCHC: 34.2 g/dL (ref 30.0–36.0)
MCV: 95.5 fL (ref 80.0–100.0)
Platelets: 293 10*3/uL (ref 150–400)
RBC: 3.52 MIL/uL — ABNORMAL LOW (ref 3.87–5.11)
RDW: 15.5 % (ref 11.5–15.5)
WBC: 4 10*3/uL (ref 4.0–10.5)
nRBC: 0 % (ref 0.0–0.2)

## 2023-10-17 NOTE — Plan of Care (Signed)

## 2023-10-17 NOTE — Progress Notes (Signed)
  PROGRESS NOTE    Allison STDENIS  [id number] DOB: Jun 02, 1932 DOA: 09/25/2023 PCP: Alan Mulder, MD  113A/113A-AA  LOS: 21 days   Brief hospital course:   Assessment & Plan: Allison [name] is a 89 y.o. female with medical history significant for Dementia, hypertension, previously at [facility], now on home hospice, sent in by hospice via EMS as APS case.     Hospice staff reported patient has very little oral intake and has not been taking any medications. Patient has baseline dementia with occasional aggression.  Patient did exhibit aggression in the emergency room, hitting out at staff and was administered Ativan.  Spoke with son on the phone [name] who stated that for the past 3 weeks patient has been sleeping most of the day and eating very little.  States she came home from [place] back in July and she was able to ambulate independently then.  Currently ambulates very little.     Urinary tract infection --UA with pos nitrite, mod leuk and many bacteria. --Urine culture has E. coli and Pseudomonas.  Received 3 days of ceftriaxone followed by 7 days of Cipro.   Acute metabolic encephalopathy Dementia with behavioral disturbance Patient presents with lethargy, agitation, altered mental status beyond baseline, possibly related to underlying UTI --Haldol PRN --avoid benzos   Sinus bradycardia Heart rate in the 50s--now improved to 60-71 --cont home atenolol at reduced 50 mg daily   HTN --clonidine patch listed on home med, however, nursing did not see a patch on pt. --cont Lisinopril --cont home atenolol at reduced 50 mg daily --IV hydralazine PRN   Hypokalemia --monitor intermittently and supplement PRN   Inadequate oral intake Nutrition Status: Nutrition Problem: Severe Malnutrition Etiology: social / environmental circumstances Signs/Symptoms: severe fat depletion, severe muscle depletion, percent weight loss Percent weight loss: 25 %     DVT prophylaxis: Lovenox SQ Code Status: DNR  Family Communication:  Level of care: Med-Surg Dispo:   The patient is from: home Anticipated d/c is to: to be determined by DSS. Anticipated d/c date is: to be determined   Subjective and Interval History:  No new event.   Objective: Vitals:   10/17/23 0754 10/17/23 1245 10/17/23 1641 10/17/23 1700  BP: (!) 145/72 (!) 135/54 (!) 139/105 (!) 130/90  Pulse: 83 89 85   Resp: 16 20 17    Temp: 97.7 F (36.5 C) (!) 97.5 F (36.4 C) (!) 97.3 F (36.3 C)   TempSrc:      SpO2: 100% 99% 95%   Weight:      Height:        Intake/Output Summary (Last 24 hours) at 10/17/2023 1749 Last data filed at 10/17/2023 1708 Gross per 24 hour  Intake 120 ml  Output --  Net 120 ml   Filed Weights   10/07/23 0500 10/07/23 2119 10/08/23 0716  Weight: 46.1 kg 45.7 kg 44.2 kg    Examination:   Constitutional: NAD CV: No cyanosis.   RESP: normal respiratory effort, on RA Extremities: No effusions, edema in BLE   Data Reviewed: I have personally reviewed labs and imaging studies  Time spent: 25 minutes  [name], MD [facility] If 7PM-7AM, please contact night-coverage 10/17/2023, 5:49 PM

## 2023-10-18 DIAGNOSIS — N3000 Acute cystitis without hematuria: Secondary | ICD-10-CM | POA: Diagnosis not present

## 2023-10-18 NOTE — Progress Notes (Signed)
  PROGRESS NOTE    NIKAYLA LITALIEN  UYQ:[id number] DOB: 02-14-1932 DOA: 09/25/2023 PCP: Alan Mulder, MD  113A/113A-AA  LOS: 22 days   Brief hospital course:   Assessment & Plan: Allison [name] is a 89 y.o. female with medical history significant for Dementia, hypertension, previously at [facility], now on home hospice, sent in by hospice via EMS as APS case.     Hospice staff reported patient has very little oral intake and has not been taking any medications. Patient has baseline dementia with occasional aggression.  Patient did exhibit aggression in the emergency room, hitting out at staff and was administered Ativan.  Spoke with son on the phone Zyion Remer who stated that for the past 3 weeks patient has been sleeping most of the day and eating very little.  States she came home from [place] back in July and she was able to ambulate independently then.  Currently ambulates very little.     Urinary tract infection --UA with pos nitrite, mod leuk and many bacteria. --Urine culture has E. coli and Pseudomonas.  Received 3 days of ceftriaxone followed by 7 days of Cipro.   Acute metabolic encephalopathy Dementia with behavioral disturbance Patient presents with lethargy, agitation, altered mental status beyond baseline, possibly related to underlying UTI --Haldol PRN --avoid benzos   Sinus bradycardia Heart rate in the 50s--now improved to 60-71 --cont home atenolol at reduced 50 mg daily   HTN --clonidine patch listed on home med, however, nursing did not see a patch on pt. --cont Lisinopril --cont home atenolol at reduced 50 mg daily --IV hydralazine PRN   Hypokalemia --monitor intermittently and supplement PRN   Inadequate oral intake Nutrition Status: Nutrition Problem: Severe Malnutrition Etiology: social / environmental circumstances Signs/Symptoms: severe fat depletion, severe muscle depletion, percent weight loss Percent weight loss: 25 %     DVT prophylaxis: Lovenox SQ Code Status: DNR  Family Communication:  Level of care: Med-Surg Dispo:   The patient is from: home Anticipated d/c is to: to be determined by DSS. Anticipated d/c date is: to be determined   Subjective and Interval History:  Pt fed herself apple sauce.     Objective: Vitals:   10/17/23 1953 10/18/23 0453 10/18/23 0936 10/18/23 0942  BP: (!) 137/50 (!) 157/68 (!) 143/76   Pulse:  (!) 53 (!) 57 60  Resp: 18  16   Temp: 98.1 F (36.7 C) 97.9 F (36.6 C)    TempSrc: Axillary Axillary    SpO2: 98% 98% 100%   Weight:      Height:        Intake/Output Summary (Last 24 hours) at 10/18/2023 1522 Last data filed at 10/17/2023 1708 Gross per 24 hour  Intake 120 ml  Output --  Net 120 ml   Filed Weights   10/07/23 0500 10/07/23 2119 10/08/23 0716  Weight: 46.1 kg 45.7 kg 44.2 kg    Examination:   Constitutional: NAD, alert, not oriented HEENT: conjunctivae and lids normal, EOMI CV: No cyanosis.   RESP: normal respiratory effort, on RA   Data Reviewed: I have personally reviewed labs and imaging studies  Time spent: 25 minutes  [name], MD [facility] If 7PM-7AM, please contact night-coverage 10/18/2023, 3:22 PM

## 2023-10-18 NOTE — Plan of Care (Signed)

## 2023-10-19 DIAGNOSIS — N3000 Acute cystitis without hematuria: Secondary | ICD-10-CM | POA: Diagnosis not present

## 2023-10-19 NOTE — Progress Notes (Signed)
  PROGRESS NOTE    Allison [name]  ZOX:[id number] DOB: February 11, 1932 DOA: 09/25/2023 PCP: Alan Mulder, MD  113A/113A-AA  LOS: 23 days   Brief hospital course:   Assessment & Plan: Allison [name] is a 89 y.o. female with medical history significant for Dementia, hypertension, previously at [facility], now on home hospice, sent in by hospice via EMS as APS case.     Hospice staff reported patient has very little oral intake and has not been taking any medications. Patient has baseline dementia with occasional aggression.  Patient did exhibit aggression in the emergency room, hitting out at staff and was administered Ativan.  Spoke with son on the phone Allison [name] who stated that for the past 3 weeks patient has been sleeping most of the day and eating very little.  States she came home from [place] back in July and she was able to ambulate independently then.  Currently ambulates very little.     Urinary tract infection --UA with pos nitrite, mod leuk and many bacteria. --Urine culture has E. coli and Pseudomonas.  Received 3 days of ceftriaxone followed by 7 days of Cipro.   Acute metabolic encephalopathy Dementia with behavioral disturbance Patient presents with lethargy, agitation, altered mental status beyond baseline, possibly related to underlying UTI --Haldol PRN --avoid benzos   Sinus bradycardia Heart rate in the 50s--now improved to 60-71 --cont home atenolol at reduced 50 mg daily   HTN --clonidine patch listed on home med, however, nursing did not see a patch on pt. --cont Lisinopril --cont home atenolol at reduced 50 mg daily --IV hydralazine PRN   Hypokalemia --monitor intermittently and supplement PRN   Inadequate oral intake Nutrition Status: Nutrition Problem: Severe Malnutrition Etiology: social / environmental circumstances Signs/Symptoms: severe fat depletion, severe muscle depletion, percent weight loss Percent weight loss: 25 %     DVT prophylaxis: Lovenox SQ Code Status: DNR  Family Communication:  Level of care: Med-Surg Dispo:   The patient is from: home Anticipated d/c is to: to be determined by DSS. Anticipated d/c date is: to be determined   Subjective and Interval History:  No new event   Objective: Vitals:   10/19/23 0508 10/19/23 0737 10/19/23 0904 10/19/23 1621  BP: (!) 166/73 (!) 180/142 (!) 140/75 (!) 184/74  Pulse: (!) 57  62 (!) 58  Resp: 18 16  20   Temp: 97.7 F (36.5 C) 98.9 F (37.2 C)  98.5 F (36.9 C)  TempSrc: Axillary   Axillary  SpO2: 100%  97% 99%  Weight:      Height:        Intake/Output Summary (Last 24 hours) at 10/19/2023 1833 Last data filed at 10/19/2023 1813 Gross per 24 hour  Intake 75 ml  Output --  Net 75 ml   Filed Weights   10/07/23 0500 10/07/23 2119 10/08/23 0716  Weight: 46.1 kg 45.7 kg 44.2 kg    Examination:   Constitutional: NAD CV: No cyanosis.   RESP: normal respiratory effort, on RA Extremities: No effusions, edema in BLE SKIN: warm, dry   Data Reviewed: I have personally reviewed labs and imaging studies  Time spent: 25 minutes  [name], MD [facility] If 7PM-7AM, please contact night-coverage 10/19/2023, 6:33 PM

## 2023-10-19 NOTE — TOC Progression Note (Signed)
Transition of Care [facility]) - Progression Note    Patient Details  Name: Allison [name] MRN: [id number] Date of Birth: [birth year]  Transition of Care [facility]) CM/SW Contact  Allena Katz, LCSW Phone Number: 10/19/2023, 4:19 PM  Clinical Narrative:   Message sent to Sharlyne Cai to find out updates regarding discharge plan.     Expected Discharge Plan:  (TBD) Barriers to Discharge: Continued Medical Work up  Expected Discharge Plan and Services       Living arrangements for the past 2 months: Single Family Home                                       Social Determinants of Health (SDOH) Interventions SDOH Screenings   Food Insecurity: Patient Unable To Answer (09/26/2023)  Housing: High Risk (09/26/2023)  Transportation Needs: Patient Unable To Answer (09/26/2023)  Utilities: Patient Unable To Answer (09/26/2023)  Tobacco Use: Low Risk  (10/12/2023)    Readmission Risk Interventions     No data to display

## 2023-10-20 DIAGNOSIS — N3000 Acute cystitis without hematuria: Secondary | ICD-10-CM | POA: Diagnosis not present

## 2023-10-20 NOTE — Progress Notes (Signed)
Noted cough after bites of food during dinner time (dys 3, thin liq)). Small bites were giving while patient positioned upright in bed. MD notified. Speech consult placed.

## 2023-10-20 NOTE — Progress Notes (Signed)
  PROGRESS NOTE    Allison [name]  [id number]:[id number] DOB: 07-07-1932 DOA: 09/25/2023 PCP: Alan Mulder, MD  113A/113A-AA  LOS: 24 days   Brief hospital [name]:   Assessment & Plan: Allison [name] is a 89 y.o. female with medical history significant for Dementia, hypertension, previously at [facility], now on home hospice, sent in by hospice via EMS as APS case.     Hospice staff reported patient has very little oral intake and has not been taking any medications. Patient has baseline dementia with occasional aggression.  Patient did exhibit aggression in the emergency room, hitting out at staff and was administered Ativan.  Spoke with son on the phone Allison [name] who stated that for the past 3 weeks patient has been sleeping most of the day and eating very little.  States she came home from [place] back in July and she was able to ambulate independently then.  Currently ambulates very little.     Urinary tract infection --UA with pos nitrite, mod leuk and many bacteria. --Urine culture has E. coli and Pseudomonas.  Received 3 days of ceftriaxone followed by 7 days of Cipro.   Acute metabolic encephalopathy Dementia with behavioral disturbance Patient presents with lethargy, agitation, altered mental status beyond baseline, possibly related to underlying UTI --Haldol PRN --avoid benzos   Sinus bradycardia Heart rate in the 50s--now improved to 60-71 --cont home atenolol at reduced 50 mg daily   HTN --clonidine patch listed on home med, however, nursing did not see a patch on pt. --cont Lisinopril --cont home atenolol at reduced 50 mg daily --IV hydralazine PRN   Hypokalemia --monitor intermittently and supplement PRN   Inadequate oral intake Nutrition Status: Nutrition Problem: Severe Malnutrition Etiology: social / environmental circumstances Signs/Symptoms: severe fat depletion, severe muscle depletion, percent weight loss Percent weight loss: 25 %     DVT prophylaxis: Lovenox SQ Code Status: DNR  Family Communication:  Level of care: Med-Surg Dispo:   The patient is from: home Anticipated d/c is to: to be determined by DSS. Anticipated d/c date is: to be determined   Subjective and Interval History:  Pt opened her mouth to indicate she wanted to eat.   Objective: Vitals:   10/20/23 0546 10/20/23 0758 10/20/23 1700 10/20/23 1917  BP: (!) 150/85 (!) 157/90 (!) 153/67 124/66  Pulse: (!) 41 60 60 61  Resp:  17 18   Temp:  (!) 97.5 F (36.4 C) 98 F (36.7 C)   TempSrc:   Oral   SpO2: 93% 100% 95% 99%  Weight:      Height:        Intake/Output Summary (Last 24 hours) at 10/20/2023 2304 Last data filed at 10/20/2023 1658 Gross per 24 hour  Intake 360 ml  Output --  Net 360 ml   Filed Weights   10/07/23 0500 10/07/23 2119 10/08/23 0716  Weight: 46.1 kg 45.7 kg 44.2 kg    Examination:   Constitutional: NAD, alert, not oriented HEENT: conjunctivae and lids normal, EOMI CV: No cyanosis.   RESP: normal respiratory effort, on RA Extremities: No effusions, edema in BLE SKIN: warm, dry   Data Reviewed: I have personally reviewed labs and imaging studies  Time spent: 25 minutes  [name], MD [facility] If 7PM-7AM, please contact night-coverage 10/20/2023, 11:04 PM

## 2023-10-20 NOTE — Plan of Care (Signed)
  Problem: Safety: Goal: Ability to remain free from injury will improve Outcome: Progressing   Problem: Skin Integrity: Goal: Risk for impaired skin integrity will decrease Outcome: Progressing   Problem: Pain Managment: Goal: General experience of comfort will improve Outcome: Progressing

## 2023-10-21 DIAGNOSIS — F0390 Unspecified dementia without behavioral disturbance: Secondary | ICD-10-CM

## 2023-10-21 NOTE — Progress Notes (Signed)
PROGRESS NOTE    Allison [name]  [id number] DOB: February 08, 1932 DOA: 09/25/2023 PCP: Alan Mulder, MD   Assessment & Plan:   Principal Problem:   Urinary tract infection Active Problems:   Metabolic encephalopathy   Dementia with behavioral disturbance (HCC)   Hypokalemia   Sinus bradycardia   Inadequate oral intake   UTI (urinary tract infection)   Pressure injury of skin   Protein-calorie malnutrition, severe  Assessment and Plan:  UTI: urine cx grew e. coli, pseudomonas. Completed abx course w/ rocephin & cipro.   Acute metabolic encephalopathy: likely secondary to UTI. Completed abx course.  Dementia: with behavioral disturbance. Haldol prn. Avoid benzos  Sinus bradycardia: continue on reduced dose of of atenolol   HTN: continue on lisinopril, atenolol. IV hydralazine prn.   Hypokalemia: will monitor intermittently    Severe protein calorie malnutrition: continue on nutritional supplements      DVT prophylaxis: lovenox  Code Status: DNR Family Communication:  Disposition Plan: medically stable. Unsafe d/c plan   Level of care: Med-Surg Status is: Inpatient Remains inpatient appropriate because: medically stable. Waiting on safe d/c plan     Consultants:    Procedures:   Antimicrobials:    Subjective: Pt is pleasantly confused  Objective: Vitals:   10/20/23 0758 10/20/23 1700 10/20/23 1917 10/21/23 0443  BP: (!) 157/90 (!) 153/67 124/66 (!) 149/69  Pulse: 60 60 61 (!) 57  Resp: 17 18    Temp: (!) 97.5 F (36.4 C) 98 F (36.7 C)    TempSrc:  Oral    SpO2: 100% 95% 99% 100%  Weight:      Height:        Intake/Output Summary (Last 24 hours) at 10/21/2023 0827 Last data filed at 10/20/2023 1658 Gross per 24 hour  Intake 360 ml  Output --  Net 360 ml   Filed Weights   10/07/23 0500 10/07/23 2119 10/08/23 0716  Weight: 46.1 kg 45.7 kg 44.2 kg    Examination:  General exam: Appears calm and comfortable  Respiratory  system: Clear to auscultation. Respiratory effort normal. Cardiovascular system: S1 & S2 +. No  rubs, gallops or clicks.  Gastrointestinal system: Abdomen is nondistended, soft and nontender. Hypoactive bowel sounds heard. Central nervous system: Alert and awake Psychiatry: Judgement and insight appears poor. Flat mood and affect     Data Reviewed: I have personally reviewed following labs and imaging studies  CBC: Recent Labs  Lab 10/17/23 0517  WBC 4.0  HGB 11.5*  HCT 33.6*  MCV 95.5  PLT 293   Basic Metabolic Panel: No results for input(s): "NA", "K", "CL", "CO2", "GLUCOSE", "BUN", "CREATININE", "CALCIUM", "MG", "PHOS" in the last 168 hours. GFR: Estimated Creatinine Clearance: 33.3 mL/min (by C-G formula based on SCr of 0.68 mg/dL). Liver Function Tests: No results for input(s): "AST", "ALT", "ALKPHOS", "BILITOT", "PROT", "ALBUMIN" in the last 168 hours. No results for input(s): "LIPASE", "AMYLASE" in the last 168 hours. No results for input(s): "AMMONIA" in the last 168 hours. Coagulation Profile: No results for input(s): "INR", "PROTIME" in the last 168 hours. Cardiac Enzymes: No results for input(s): "CKTOTAL", "CKMB", "CKMBINDEX", "TROPONINI" in the last 168 hours. BNP (last 3 results) No results for input(s): "PROBNP" in the last 8760 hours. HbA1C: No results for input(s): "HGBA1C" in the last 72 hours. CBG: No results for input(s): "GLUCAP" in the last 168 hours. Lipid Profile: No results for input(s): "CHOL", "HDL", "LDLCALC", "TRIG", "CHOLHDL", "LDLDIRECT" in the last 72 hours. Thyroid Function Tests:  No results for input(s): "TSH", "T4TOTAL", "FREET4", "T3FREE", "THYROIDAB" in the last 72 hours. Anemia Panel: No results for input(s): "VITAMINB12", "FOLATE", "FERRITIN", "TIBC", "IRON", "RETICCTPCT" in the last 72 hours. Sepsis Labs: No results for input(s): "PROCALCITON", "LATICACIDVEN" in the last 168 hours.  No results found for this or any previous visit  (from the past 240 hour(s)).       Radiology Studies: No results found.      Scheduled Meds:  atenolol  50 mg Oral Daily   donepezil  10 mg Oral Daily   enoxaparin (LOVENOX) injection  40 mg Subcutaneous Q24H   feeding supplement  237 mL Oral BID BM   FLUoxetine  10 mg Oral Daily   levothyroxine  100 mcg Oral Q0600   lisinopril  40 mg Oral Daily   multivitamin with minerals  1 tablet Oral Daily   Continuous Infusions:   LOS: 25 days        Charise Killian, MD [facility] Pager [phone]  If 7PM-7AM, please contact night-coverage [email or web address] Password [email or web address] 10/21/2023, 8:27 AM

## 2023-10-21 NOTE — Evaluation (Signed)
Clinical/Bedside Swallow Evaluation Patient Details  Name: Allison [name] MRN: [id number] Date of Birth: [birth year]  Today's Date: 10/21/2023 Time: SLP Start Time (ACUTE ONLY): 1155 SLP Stop Time (ACUTE ONLY): 1245 SLP Time Calculation (min) (ACUTE ONLY): 50 min  Past Medical History:  Past Medical History:  Diagnosis Date   Anemia    Asthma    Dementia (HCC)    Past Surgical History: History reviewed. No pertinent surgical history. HPI:  Pt is a 89 y.o. female with medical history significant for Dementia, hypertension, previously at [facility], now on home hospice, sent in by hospice via EMS as APS case.  Hospice staff reported patient has very little oral intake and has not been taking any medications. Patient has baseline Dementia with occasional aggression.  Patient did exhibit aggression in the emergency room, hitting out at staff.  Son on the phone stated in the ED that for the past 3 weeks patient has been sleeping most of the day and eating very little.  Minimally ambulatory.   Head CT in 12/2022: No evidence of acute intracranial abnormality.  2. Mild chronic small vessel ischemic disease.  3. Cerebral atrophy including prominent bilateral temporal lobe  volume loss.  No CXR this admit.  Per MD notes, Respiratory system: Clear to auscultation. Respiratory effort normal (10/21/2023).    Assessment / Plan / Recommendation  Clinical Impression   Pt seen for BSE today. Pt awakened to verbal/tactile stim; relaxed and pleasant during session w/ this SLP. Few casual words but Confusion noted in responses; did not follow any commands. Pt has Baseline Dementia dx'd per chart. Pt has not been eating much per chart/staff report. Pt did not make any attempt to feed self. On RA; afebrile. WBC WNL.   Pt appears to present w/ grossly functional pharyngeal phase swallowing w/ No pharyngeal phase dysphagia noted, No gross neuromuscular deficits noted. Pt exhibits min+ increased oral phase time  w/ increased textured foods -- suspect direct impact from Baseline Dementia and Cognitive decline in setting of illness. ANY Cognitive decline can impact overall awareness/timing of swallow and safety during po tasks which increases risk for aspiration, choking. It can also impact pt's desire for oral intake.  Pt's risk for aspiration can be reduced when following general aspiration precautions and using a modified diet consistency of broken down foods moistened for easy oral phase mastication/management/clearing.   Pt consumed po trials w/ No overt, clinical s/s of aspiration during po trials. Pt has challenging factors that could impact her oropharyngeal swallowing to include Baseline Dementia/Cognitive decline, need for support w/ feeding/oral intake, deconditioning/weakness, and more sedentary status currently.    During po trials, pt consumed consistencies w/ no overt coughing, decline in vocal quality, or change in respiratory presentation during/post trials. O2 sats remained mid-upper 90s when checked. Oral phase appeared grossly [facility] w/ timely bolus management and control of bolus propulsion for A-P transfer for swallowing of liquids and purees. Noted only min+ increased oral phase time w/ mastication Time/effort of increased textured trials -- softened solids in a puree. Given Time, oral clearing achieved w/ all trial consistencies.  OM Exam appeared [facility] during bolus management/oral clearing and speech w/ no unilateral weakness noted. Speech mumbled but clear. Pt did not attempt to help to Hold Cup to feed self. She did not like to be touched it seemed.    Recommend a more MINCED consistency diet(dys. Level 2) w/ well-moistened foods for ease of oral phase chewing/Time; Thin liquids. Pt should Hold Cup when  drinking. Recommend general aspiration precautions, reduce distractions during meals. Engage pt in self feeding as much as possible; straw use ok if no increaed coughing noted. Tray setup and  sitting fully upright for all oral intake. Do not feed if pt is agitated. Pills CRUSHED in Puree for safer, easier swallowing -- encouraged now and for D/C.  Education given on Pills in Puree; food consistencies and easy to eat options; general aspiration precautions to pt and NSG. No further skilled ST services indicated as this diet consistency will best support safer oral intake and hopefully increase oral intake. Further ST services could be had at next venue of care if indicated. MD/NSG to reconsult if any new needs arise during admit. NSG updated, agreed. MD updated. Handouts posted. Recommend Dietician f/u for support. Recommend Palliative Care f/u w/ GOC discussion moving forward d/t impact of Cognitive decline on swallowing and oral intake.  SLP Visit Diagnosis: Dysphagia, oral phase (R13.11) (baseline Dementia; suspect Esophageal phase Dysmotility in setting of Belching; need for full support w/ feeding)    Aspiration Risk  Mild aspiration risk;Risk for inadequate nutrition/hydration (reduced following general aspiration precautions w/ a modified diet consistency(foods))    Diet Recommendation   Thin;Dysphagia 2 (chopped) (gravies) = a more MINCED consistency diet(dys. Level 2) w/ well-moistened foods for ease of oral phase chewing/Time; Thin liquids. Pt should Hold Cup when drinking. Recommend general aspiration precautions, reduce distractions during meals. Engage pt in self feeding as much as possible; straw use ok if no increaed coughing noted. Tray setup and sitting fully upright for all oral intake. Do not feed if pt is agitated.   Medication Administration: Crushed with puree    Other  Recommendations Recommended Consults:  (Palliative Care f/u for GOC; Dietician) Oral Care Recommendations: Oral care BID;Oral care before and after PO;Staff/trained caregiver to provide oral care    Recommendations for follow up therapy are one component of a multi-disciplinary discharge planning  process, led by the attending physician.  Recommendations may be updated based on patient status, additional functional criteria and insurance authorization.  Follow up Recommendations No SLP follow up      Assistance Recommended at Discharge  Full d/t Dementia  Functional Status Assessment Patient has had a recent decline in their functional status and/or demonstrates limited ability to make significant improvements in function in a reasonable and predictable amount of time  Frequency and Duration  (n/a)   (n/a)       Prognosis Prognosis for improved oropharyngeal function: Fair Barriers to Reach Goals: Cognitive deficits;Language deficits;Time post onset;Severity of deficits;Behavior Barriers/Prognosis Comment: baseline Dementia; suspect Esophageal phase Dysmotility in setting of Belching; need for full support w/ feeding      Swallow Study   General Date of Onset: 09/25/23 HPI: Pt is a 89 y.o. female with medical history significant for Dementia, hypertension, previously at [facility], now on home hospice, sent in by hospice via EMS as APS case.  Hospice staff reported patient has very little oral intake and has not been taking any medications. Patient has baseline Dementia with occasional aggression.  Patient did exhibit aggression in the emergency room, hitting out at staff.  Son on the phone stated in the ED that for the past 3 weeks patient has been sleeping most of the day and eating very little.  Minimally ambulatory.   Head CT in 12/2022: No evidence of acute intracranial abnormality.  2. Mild chronic small vessel ischemic disease.  3. Cerebral atrophy including prominent bilateral temporal lobe  volume loss.  No CXR this admit.  Per MD notes, Respiratory system: Clear to auscultation. Respiratory effort normal (10/21/2023). Type of Study: Bedside Swallow Evaluation Previous Swallow Assessment: none Diet Prior to this Study: Regular;Thin liquids (Level 0) Temperature Spikes Noted:  No (wbc 4.0) Respiratory Status: Room air History of Recent Intubation: No Behavior/Cognition: Alert;Cooperative;Pleasant mood;Confused;Distractible;Requires cueing;Doesn't follow directions Oral Cavity Assessment:  (CNT d/t pt not opening her mouth to assess) Oral Care Completed by SLP: Recent completion by staff Oral Cavity - Dentition: Missing dentition (it appeared) Vision:  (n/a) Self-Feeding Abilities: Total assist (did not attempt to self-feed) Patient Positioning: Upright in bed (full positioning assistance) Baseline Vocal Quality: Low vocal intensity;Normal (mumbled speech) Volitional Cough: Cognitively unable to elicit Volitional Swallow: Unable to elicit    Oral/Motor/Sensory Function Overall Oral Motor/Sensory Function: Within functional limits (w/ bolus management and oral clearing)   Ice Chips Ice chips: Within functional limits Presentation: Spoon (fed; 1 trial)   Thin Liquid Thin Liquid: Within functional limits (grossly - min oral prep decrease awareness during use of straw) Presentation: Straw (fed; 10 trials) Other Comments: time b/t sips; cueing    Nectar Thick Nectar Thick Liquid: Not tested   Honey Thick Honey Thick Liquid: Not tested   Puree Puree: Within functional limits Presentation: Spoon (fed; 10+ trials)   Solid     Solid: Impaired (min) Presentation: Spoon (fed; 10 trials) Oral Phase Impairments: Impaired mastication (min slower w/ increased oral phase time but overall functional w/ chopped/moistened bites) Oral Phase Functional Implications: Impaired mastication;Prolonged oral transit (time needed) Pharyngeal Phase Impairments:  (none)        Jerilynn Som, MS, CCC-SLP Speech Language Pathologist Rehab Services; [facility] [phone] (ascom) Jadah Bobak 10/21/2023,3:38 PM

## 2023-10-21 NOTE — TOC Progression Note (Signed)
Transition of Care [facility]) - Progression Note    Patient Details  Name: Allison [name] MRN: [id number] Date of Birth: 1932-04-09  Transition of Care [facility]) CM/SW Contact  Allena Katz, LCSW Phone Number: 10/21/2023, 11:06 AM  Clinical Narrative:  CSW called for the second time, Sharlyne Cai with DSS and sent a text message to follow up on this patient as she has several bed offers.     Expected Discharge Plan:  (TBD) Barriers to Discharge: Continued Medical Work up  Expected Discharge Plan and Services       Living arrangements for the past 2 months: Single Family Home                                       Social Determinants of Health (SDOH) Interventions SDOH Screenings   Food Insecurity: Patient Unable To Answer (09/26/2023)  Housing: High Risk (09/26/2023)  Transportation Needs: Patient Unable To Answer (09/26/2023)  Utilities: Patient Unable To Answer (09/26/2023)  Tobacco Use: Low Risk  (10/12/2023)    Readmission Risk Interventions     No data to display

## 2023-10-21 NOTE — Progress Notes (Signed)
Pt refusing wound measurement assessment. Pt attempted to bite RN and continued to strike out at staff.

## 2023-10-21 NOTE — Plan of Care (Signed)
  Problem: Clinical Measurements: Goal: Ability to maintain clinical measurements within normal limits will improve Outcome: Progressing Goal: Will remain free from infection Outcome: Progressing Goal: Diagnostic test results will improve Outcome: Progressing Goal: Respiratory complications will improve Outcome: Progressing Goal: Cardiovascular complication will be avoided Outcome: Progressing   Problem: Activity: Goal: Risk for activity intolerance will decrease Outcome: Progressing   Problem: Elimination: Goal: Will not experience complications related to bowel motility Outcome: Progressing Goal: Will not experience complications related to urinary retention Outcome: Progressing   Problem: Pain Managment: Goal: General experience of comfort will improve Outcome: Progressing

## 2023-10-22 DIAGNOSIS — F0390 Unspecified dementia without behavioral disturbance: Secondary | ICD-10-CM | POA: Diagnosis not present

## 2023-10-22 NOTE — Plan of Care (Signed)

## 2023-10-22 NOTE — TOC Progression Note (Signed)
Transition of Care [facility]) - Progression Note    Patient Details  Name: Allison [name] MRN: [id number] Date of Birth: 09/24/1932  Transition of Care [facility]) CM/SW Contact  Allena Katz, LCSW Phone Number: 10/22/2023, 10:47 AM  Clinical Narrative:   Gilman Schmidt to get an update on patients case.     Expected Discharge Plan:  (TBD) Barriers to Discharge: Continued Medical Work up  Expected Discharge Plan and Services       Living arrangements for the past 2 months: Single Family Home                                       Social Determinants of Health (SDOH) Interventions SDOH Screenings   Food Insecurity: Patient Unable To Answer (09/26/2023)  Housing: High Risk (09/26/2023)  Transportation Needs: Patient Unable To Answer (09/26/2023)  Utilities: Patient Unable To Answer (09/26/2023)  Tobacco Use: Low Risk  (10/12/2023)    Readmission Risk Interventions     No data to display

## 2023-10-22 NOTE — Progress Notes (Signed)
PROGRESS NOTE    Allison [name]  HQI:[id number] DOB: 1932/07/10 DOA: 09/25/2023 PCP: Alan Mulder, MD   Assessment & Plan:   Principal Problem:   Urinary tract infection Active Problems:   Metabolic encephalopathy   Dementia with behavioral disturbance (HCC)   Hypokalemia   Sinus bradycardia   Inadequate oral intake   UTI (urinary tract infection)   Pressure injury of skin   Protein-calorie malnutrition, severe  Assessment and Plan:  UTI: urine cx grew e. coli, pseudomonas. Completed abx course w/ rocephin & cipro.   Acute metabolic encephalopathy: likely secondary to UTI. Completed benzos   Dementia: with behavioral disturbance.  Haldol prn. Avoid benzos  Sinus bradycardia: continue on reduced dose of atenolol.   HTN: continue on atenolol, lisinopril. IV hydralazine prn.   Hypokalemia: will monitor intermittently    Severe protein calorie malnutrition: continue on nutritional supplements      DVT prophylaxis: lovenox  Code Status: DNR Family Communication:  Disposition Plan: medically stable. Unsafe d/c plan   Level of care: Med-Surg Status is: Inpatient Remains inpatient appropriate because: medically stable. Waiting on safe d/c plan     Consultants:    Procedures:   Antimicrobials:    Subjective: Pt is pleasantly confused   Objective: Vitals:   10/21/23 1734 10/21/23 1945 10/22/23 0532 10/22/23 0752  BP: (!) 145/71 (!) 167/82 137/76 (!) 143/69  Pulse: 67 91 61 60  Resp: 20 20 16 16   Temp: 97.6 F (36.4 C) 97.6 F (36.4 C) 97.7 F (36.5 C) (!) 97 F (36.1 C)  TempSrc: Axillary Axillary    SpO2: 98% 96% 100% 98%  Weight:      Height:        Intake/Output Summary (Last 24 hours) at 10/22/2023 0800 Last data filed at 10/21/2023 0900 Gross per 24 hour  Intake 120 ml  Output --  Net 120 ml   Filed Weights   10/07/23 0500 10/07/23 2119 10/08/23 0716  Weight: 46.1 kg 45.7 kg 44.2 kg    Examination:  General exam: Appears  comfortable   Respiratory system: clear breath sounds b/l  Cardiovascular system: S1/S2+. No rubs or clicks Gastrointestinal system: abd is soft, NT, ND & hypoactive bowel sounds  Central nervous system: Alert and awake Psychiatry: judgement and insight appears poor. Flat mood and affect    Data Reviewed: I have personally reviewed following labs and imaging studies  CBC: Recent Labs  Lab 10/17/23 0517  WBC 4.0  HGB 11.5*  HCT 33.6*  MCV 95.5  PLT 293   Basic Metabolic Panel: No results for input(s): "NA", "K", "CL", "CO2", "GLUCOSE", "BUN", "CREATININE", "CALCIUM", "MG", "PHOS" in the last 168 hours. GFR: Estimated Creatinine Clearance: 33.3 mL/min (by C-G formula based on SCr of 0.68 mg/dL). Liver Function Tests: No results for input(s): "AST", "ALT", "ALKPHOS", "BILITOT", "PROT", "ALBUMIN" in the last 168 hours. No results for input(s): "LIPASE", "AMYLASE" in the last 168 hours. No results for input(s): "AMMONIA" in the last 168 hours. Coagulation Profile: No results for input(s): "INR", "PROTIME" in the last 168 hours. Cardiac Enzymes: No results for input(s): "CKTOTAL", "CKMB", "CKMBINDEX", "TROPONINI" in the last 168 hours. BNP (last 3 results) No results for input(s): "PROBNP" in the last 8760 hours. HbA1C: No results for input(s): "HGBA1C" in the last 72 hours. CBG: No results for input(s): "GLUCAP" in the last 168 hours. Lipid Profile: No results for input(s): "CHOL", "HDL", "LDLCALC", "TRIG", "CHOLHDL", "LDLDIRECT" in the last 72 hours. Thyroid Function Tests: No results for  input(s): "TSH", "T4TOTAL", "FREET4", "T3FREE", "THYROIDAB" in the last 72 hours. Anemia Panel: No results for input(s): "VITAMINB12", "FOLATE", "FERRITIN", "TIBC", "IRON", "RETICCTPCT" in the last 72 hours. Sepsis Labs: No results for input(s): "PROCALCITON", "LATICACIDVEN" in the last 168 hours.  No results found for this or any previous visit (from the past 240 hour(s)).        Radiology Studies: No results found.      Scheduled Meds:  atenolol  50 mg Oral Daily   donepezil  10 mg Oral Daily   enoxaparin (LOVENOX) injection  40 mg Subcutaneous Q24H   feeding supplement  237 mL Oral BID BM   FLUoxetine  10 mg Oral Daily   levothyroxine  100 mcg Oral Q0600   lisinopril  40 mg Oral Daily   multivitamin with minerals  1 tablet Oral Daily   Continuous Infusions:   LOS: 26 days        Charise Killian, MD [facility] Pager [phone]  If 7PM-7AM, please contact night-coverage [email or web address] Password [email or web address] 10/22/2023, 8:00 AM

## 2023-10-23 DIAGNOSIS — F0390 Unspecified dementia without behavioral disturbance: Secondary | ICD-10-CM | POA: Diagnosis not present

## 2023-10-23 NOTE — Progress Notes (Signed)
PROGRESS NOTE    Allison [name]  UEA:[id number] DOB: September 25, 1932 DOA: 09/25/2023 PCP: Alan Mulder, MD   Assessment & Plan:   Principal Problem:   Urinary tract infection Active Problems:   Metabolic encephalopathy   Dementia with behavioral disturbance (HCC)   Hypokalemia   Sinus bradycardia   Inadequate oral intake   UTI (urinary tract infection)   Pressure injury of skin   Protein-calorie malnutrition, severe  Assessment and Plan:  UTI: urine cx grew e. coli, pseudomonas. Completed abx course w/ rocephin & cipro.   Acute metabolic encephalopathy: likely secondary to UTI.   Dementia: with behavioral disturbance.  Haldol prn. Avoid benzos  Sinus bradycardia: continue on reduced dose of atenolol   HTN: continue on lisinopril, atenolol. IV hydralazine prn    Hypokalemia: will monitor intermittently    Severe protein calorie malnutrition: continue on nutritional supplements      DVT prophylaxis: lovenox  Code Status: DNR Family Communication:  Disposition Plan: medically stable. Unsafe d/c plan   Level of care: Med-Surg Status is: Inpatient Remains inpatient appropriate because: medically stable. Waiting on safe d/c plan     Consultants:    Procedures:   Antimicrobials:    Subjective: Pt is pleasantly confused.   Objective: Vitals:   10/22/23 0752 10/22/23 2005 10/23/23 0632 10/23/23 0731  BP: (!) 143/69 135/83 131/69 (!) 170/80  Pulse: 60 71 (!) 56 61  Resp: 16 16 17 14   Temp: (!) 97 F (36.1 C) 98.3 F (36.8 C)  97.9 F (36.6 C)  TempSrc:  Oral    SpO2: 98% 100% 100% 97%  Weight:      Height:        Intake/Output Summary (Last 24 hours) at 10/23/2023 0806 Last data filed at 10/22/2023 1855 Gross per 24 hour  Intake 240 ml  Output --  Net 240 ml   Filed Weights   10/07/23 0500 10/07/23 2119 10/08/23 0716  Weight: 46.1 kg 45.7 kg 44.2 kg    Examination:  General exam: appears comfortable  Respiratory system: clear  breath sounds b/l  Cardiovascular system: S1/S2+. No rubs or clicks Gastrointestinal system: abd is soft, NT, ND & hypoactive bowel sounds  Central nervous system: Alert and awake Psychiatry: judgement and insight appears poor. Flat mood and affect    Data Reviewed: I have personally reviewed following labs and imaging studies  CBC: Recent Labs  Lab 10/17/23 0517  WBC 4.0  HGB 11.5*  HCT 33.6*  MCV 95.5  PLT 293   Basic Metabolic Panel: No results for input(s): "NA", "K", "CL", "CO2", "GLUCOSE", "BUN", "CREATININE", "CALCIUM", "MG", "PHOS" in the last 168 hours. GFR: Estimated Creatinine Clearance: 33.3 mL/min (by C-G formula based on SCr of 0.68 mg/dL). Liver Function Tests: No results for input(s): "AST", "ALT", "ALKPHOS", "BILITOT", "PROT", "ALBUMIN" in the last 168 hours. No results for input(s): "LIPASE", "AMYLASE" in the last 168 hours. No results for input(s): "AMMONIA" in the last 168 hours. Coagulation Profile: No results for input(s): "INR", "PROTIME" in the last 168 hours. Cardiac Enzymes: No results for input(s): "CKTOTAL", "CKMB", "CKMBINDEX", "TROPONINI" in the last 168 hours. BNP (last 3 results) No results for input(s): "PROBNP" in the last 8760 hours. HbA1C: No results for input(s): "HGBA1C" in the last 72 hours. CBG: No results for input(s): "GLUCAP" in the last 168 hours. Lipid Profile: No results for input(s): "CHOL", "HDL", "LDLCALC", "TRIG", "CHOLHDL", "LDLDIRECT" in the last 72 hours. Thyroid Function Tests: No results for input(s): "TSH", "T4TOTAL", "FREET4", "T3FREE", "  THYROIDAB" in the last 72 hours. Anemia Panel: No results for input(s): "VITAMINB12", "FOLATE", "FERRITIN", "TIBC", "IRON", "RETICCTPCT" in the last 72 hours. Sepsis Labs: No results for input(s): "PROCALCITON", "LATICACIDVEN" in the last 168 hours.  No results found for this or any previous visit (from the past 240 hour(s)).       Radiology Studies: No results  found.      Scheduled Meds:  atenolol  50 mg Oral Daily   donepezil  10 mg Oral Daily   enoxaparin (LOVENOX) injection  40 mg Subcutaneous Q24H   feeding supplement  237 mL Oral BID BM   FLUoxetine  10 mg Oral Daily   levothyroxine  100 mcg Oral Q0600   lisinopril  40 mg Oral Daily   multivitamin with minerals  1 tablet Oral Daily   Continuous Infusions:   LOS: 27 days        Charise Killian, MD [facility] Pager [phone]  If 7PM-7AM, please contact night-coverage [email or web address] 10/23/2023, 8:06 AM

## 2023-10-23 NOTE — Plan of Care (Signed)

## 2023-10-24 DIAGNOSIS — F0390 Unspecified dementia without behavioral disturbance: Secondary | ICD-10-CM | POA: Diagnosis not present

## 2023-10-24 NOTE — Plan of Care (Signed)

## 2023-10-24 NOTE — Progress Notes (Signed)
PROGRESS NOTE    Allison [name]  ZOX:[id number] DOB: 03-Jul-1932 DOA: 09/25/2023 PCP: Alan Mulder, MD   Assessment & Plan:   Principal Problem:   Urinary tract infection Active Problems:   Metabolic encephalopathy   Dementia with behavioral disturbance (HCC)   Hypokalemia   Sinus bradycardia   Inadequate oral intake   UTI (urinary tract infection)   Pressure injury of skin   Protein-calorie malnutrition, severe  Assessment and Plan:  UTI: urine cx grew e. coli, pseudomonas. Completed abx course w/ rocephin & cipro.   Acute metabolic encephalopathy: likely secondary to UTI.   Dementia: with behavioral disturbance. Avoid benzos. Haldol prn   Sinus bradycardia: continue on reduced dose of atenolol   HTN: continue on atenolol, lisinopril. IV hydralazine prn    Hypokalemia: will monitor intermittently    Severe protein calorie malnutrition: continue on nutritional supplements      DVT prophylaxis: lovenox  Code Status: DNR Family Communication:  Disposition Plan: medically stable. Unsafe d/c plan   Level of care: Med-Surg Status is: Inpatient Remains inpatient appropriate because: medically stable. Waiting on safe d/c plan     Consultants:    Procedures:   Antimicrobials:    Subjective: Pt is pleasantly confused  Objective: Vitals:   10/23/23 0632 10/23/23 0731 10/23/23 2052 10/24/23 0346  BP: 131/69 (!) 170/80 (!) 155/65 134/87  Pulse: (!) 56 61 65 (!) 59  Resp: 17 14 16 16   Temp:  97.9 F (36.6 C) 98.3 F (36.8 C)   TempSrc:      SpO2: 100% 97% 97% 99%  Weight:      Height:       No intake or output data in the 24 hours ending 10/24/23 0806  Filed Weights   10/07/23 0500 10/07/23 2119 10/08/23 0716  Weight: 46.1 kg 45.7 kg 44.2 kg    Examination:  General exam: appears confused  Respiratory system: clear breath sounds b/l  Cardiovascular system: S1/S2+. No rubs or clicks  Gastrointestinal system: abd is soft, NT, ND &  hypoactive bowel sounds  Central nervous system: Pt appears sleepy  Psychiatry: judgement and insight appear poor.     Data Reviewed: I have personally reviewed following labs and imaging studies  CBC: No results for input(s): "WBC", "NEUTROABS", "HGB", "HCT", "MCV", "PLT" in the last 168 hours.  Basic Metabolic Panel: No results for input(s): "NA", "K", "CL", "CO2", "GLUCOSE", "BUN", "CREATININE", "CALCIUM", "MG", "PHOS" in the last 168 hours. GFR: Estimated Creatinine Clearance: 33.3 mL/min (by C-G formula based on SCr of 0.68 mg/dL). Liver Function Tests: No results for input(s): "AST", "ALT", "ALKPHOS", "BILITOT", "PROT", "ALBUMIN" in the last 168 hours. No results for input(s): "LIPASE", "AMYLASE" in the last 168 hours. No results for input(s): "AMMONIA" in the last 168 hours. Coagulation Profile: No results for input(s): "INR", "PROTIME" in the last 168 hours. Cardiac Enzymes: No results for input(s): "CKTOTAL", "CKMB", "CKMBINDEX", "TROPONINI" in the last 168 hours. BNP (last 3 results) No results for input(s): "PROBNP" in the last 8760 hours. HbA1C: No results for input(s): "HGBA1C" in the last 72 hours. CBG: No results for input(s): "GLUCAP" in the last 168 hours. Lipid Profile: No results for input(s): "CHOL", "HDL", "LDLCALC", "TRIG", "CHOLHDL", "LDLDIRECT" in the last 72 hours. Thyroid Function Tests: No results for input(s): "TSH", "T4TOTAL", "FREET4", "T3FREE", "THYROIDAB" in the last 72 hours. Anemia Panel: No results for input(s): "VITAMINB12", "FOLATE", "FERRITIN", "TIBC", "IRON", "RETICCTPCT" in the last 72 hours. Sepsis Labs: No results for input(s): "PROCALCITON", "LATICACIDVEN"  in the last 168 hours.  No results found for this or any previous visit (from the past 240 hour(s)).       Radiology Studies: No results found.      Scheduled Meds:  atenolol  50 mg Oral Daily   donepezil  10 mg Oral Daily   enoxaparin (LOVENOX) injection  40 mg  Subcutaneous Q24H   feeding supplement  237 mL Oral BID BM   FLUoxetine  10 mg Oral Daily   levothyroxine  100 mcg Oral Q0600   lisinopril  40 mg Oral Daily   multivitamin with minerals  1 tablet Oral Daily   Continuous Infusions:   LOS: 28 days        Charise Killian, MD [facility] Pager [phone]  If 7PM-7AM, please contact night-coverage [email or web address] 10/24/2023, 8:06 AM

## 2023-10-25 DIAGNOSIS — F0390 Unspecified dementia without behavioral disturbance: Secondary | ICD-10-CM | POA: Diagnosis not present

## 2023-10-25 NOTE — Progress Notes (Signed)
PROGRESS NOTE    Allison [name]  ZOX:[id number] DOB: 12-31-1931 DOA: 09/25/2023 PCP: Alan Mulder, MD   Assessment & Plan:   Principal Problem:   Urinary tract infection Active Problems:   Metabolic encephalopathy   Dementia with behavioral disturbance (HCC)   Hypokalemia   Sinus bradycardia   Inadequate oral intake   UTI (urinary tract infection)   Pressure injury of skin   Protein-calorie malnutrition, severe  Assessment and Plan:  UTI: urine cx grew e. coli, pseudomonas. Completed abx course w/ rocephin & cipro.   Acute metabolic encephalopathy: likely secondary to UTI.   Dementia: with behavioral disturbance. Avoid benzos. Haldol prn   Sinus bradycardia: continue on reduced dose of atenolol  HTN: continue on lisinopril, atenolol. IV hydralazine prn   Hypokalemia: will monitor intermittently   Severe protein calorie malnutrition: continue on nutritional supplements     DVT prophylaxis: lovenox  Code Status: DNR Family Communication:  Disposition Plan: medically stable. Unsafe d/c plan   Level of care: Med-Surg Status is: Inpatient Remains inpatient appropriate because: medically stable. Waiting on safe d/c plan     Consultants:    Procedures:   Antimicrobials:    Subjective: Pt is pleasantly confused  Objective: Vitals:   10/24/23 1102 10/24/23 1303 10/24/23 1600 10/24/23 2055  BP: 114/66 104/69 130/70 (!) 156/77  Pulse:  78 69 (!) 53  Resp:  16 18 18   Temp:  98.4 F (36.9 C)  98.1 F (36.7 C)  TempSrc:  Oral  Oral  SpO2:   100% 100%  Weight:      Height:        Intake/Output Summary (Last 24 hours) at 10/25/2023 0748 Last data filed at 10/24/2023 1700 Gross per 24 hour  Intake 650 ml  Output --  Net 650 ml    Filed Weights   10/08/23 0716  Weight: 44.2 kg    Examination:  General exam: appears confused  Respiratory system: clear breath sounds b/l  Cardiovascular system: S1/S2+. No rubs or clicks Gastrointestinal  system: abd is soft, NT, ND & hypoactive bowel sounds Central nervous system: alert & awake   Psychiatry: judgement and insight appears poor    Data Reviewed: I have personally reviewed following labs and imaging studies  CBC: No results for input(s): "WBC", "NEUTROABS", "HGB", "HCT", "MCV", "PLT" in the last 168 hours.  Basic Metabolic Panel: No results for input(s): "NA", "K", "CL", "CO2", "GLUCOSE", "BUN", "CREATININE", "CALCIUM", "MG", "PHOS" in the last 168 hours. GFR: Estimated Creatinine Clearance: 33.3 mL/min (by C-G formula based on SCr of 0.68 mg/dL). Liver Function Tests: No results for input(s): "AST", "ALT", "ALKPHOS", "BILITOT", "PROT", "ALBUMIN" in the last 168 hours. No results for input(s): "LIPASE", "AMYLASE" in the last 168 hours. No results for input(s): "AMMONIA" in the last 168 hours. Coagulation Profile: No results for input(s): "INR", "PROTIME" in the last 168 hours. Cardiac Enzymes: No results for input(s): "CKTOTAL", "CKMB", "CKMBINDEX", "TROPONINI" in the last 168 hours. BNP (last 3 results) No results for input(s): "PROBNP" in the last 8760 hours. HbA1C: No results for input(s): "HGBA1C" in the last 72 hours. CBG: No results for input(s): "GLUCAP" in the last 168 hours. Lipid Profile: No results for input(s): "CHOL", "HDL", "LDLCALC", "TRIG", "CHOLHDL", "LDLDIRECT" in the last 72 hours. Thyroid Function Tests: No results for input(s): "TSH", "T4TOTAL", "FREET4", "T3FREE", "THYROIDAB" in the last 72 hours. Anemia Panel: No results for input(s): "VITAMINB12", "FOLATE", "FERRITIN", "TIBC", "IRON", "RETICCTPCT" in the last 72 hours. Sepsis Labs: No results  for input(s): "PROCALCITON", "LATICACIDVEN" in the last 168 hours.  No results found for this or any previous visit (from the past 240 hour(s)).       Radiology Studies: No results found.      Scheduled Meds:  atenolol  50 mg Oral Daily   donepezil  10 mg Oral Daily   enoxaparin  (LOVENOX) injection  40 mg Subcutaneous Q24H   feeding supplement  237 mL Oral BID BM   FLUoxetine  10 mg Oral Daily   levothyroxine  100 mcg Oral Q0600   lisinopril  40 mg Oral Daily   multivitamin with minerals  1 tablet Oral Daily   Continuous Infusions:   LOS: 29 days        Charise Killian, MD [facility] Pager [phone]  If 7PM-7AM, please contact night-coverage [email or web address] 10/25/2023, 7:48 AM

## 2023-10-25 NOTE — Plan of Care (Signed)

## 2023-10-25 NOTE — Plan of Care (Signed)

## 2023-10-26 DIAGNOSIS — F0390 Unspecified dementia without behavioral disturbance: Secondary | ICD-10-CM | POA: Diagnosis not present

## 2023-10-26 NOTE — TOC Progression Note (Signed)
Transition of Care [facility]) - Progression Note    Patient Details  Name: Allison [name] MRN: [id number] Date of Birth: 1932-09-08  Transition of [facility]) CM/SW Contact  Allena Katz, LCSW Phone Number: 10/26/2023, 1:46 PM  Clinical Narrative:   per Milinda Pointer at DSS [name] is assigned to this case and family is working on selling assets to be able to qualify patient for [employer] [employer].     Expected Discharge Plan:  (TBD) Barriers to Discharge: Continued Medical Work up  Expected Discharge Plan and Services       Living arrangements for the past 2 months: Single Family Home                                       Social Determinants of Health (SDOH) Interventions SDOH Screenings   Food Insecurity: Patient Unable To Answer (09/26/2023)  Housing: High Risk (09/26/2023)  Transportation Needs: Patient Unable To Answer (09/26/2023)  Utilities: Patient Unable To Answer (09/26/2023)  Tobacco Use: Low Risk  (10/12/2023)    Readmission Risk Interventions     No data to display

## 2023-10-26 NOTE — TOC Progression Note (Signed)
Transition of Care [facility]) - Progression Note    Patient Details  Name: Allison [name] MRN: [id number] Date of Birth: May 16, 1932  Transition of Care [facility]) CM/SW Contact  Allena Katz, LCSW Phone Number: 10/26/2023, 10:49 AM  Clinical Narrative:   Message sent to [facility] with DSS.     Expected Discharge Plan:  (TBD) Barriers to Discharge: Continued Medical Work up  Expected Discharge Plan and Services       Living arrangements for the past 2 months: Single Family Home                                       Social Determinants of Health (SDOH) Interventions SDOH Screenings   Food Insecurity: Patient Unable To Answer (09/26/2023)  Housing: High Risk (09/26/2023)  Transportation Needs: Patient Unable To Answer (09/26/2023)  Utilities: Patient Unable To Answer (09/26/2023)  Tobacco Use: Low Risk  (10/12/2023)    Readmission Risk Interventions     No data to display

## 2023-10-26 NOTE — Progress Notes (Signed)
PROGRESS NOTE    Allison [name]  UEA:[id number] DOB: 09-Sep-1932 DOA: 09/25/2023 PCP: Alan Mulder, MD   Assessment & Plan:   Principal Problem:   Urinary tract infection Active Problems:   Metabolic encephalopathy   Dementia with behavioral disturbance (HCC)   Hypokalemia   Sinus bradycardia   Inadequate oral intake   UTI (urinary tract infection)   Pressure injury of skin   Protein-calorie malnutrition, severe  Assessment and Plan:  UTI: urine cx grew e. coli, pseudomonas. Completed abx course w/ rocephin & cipro.   Acute metabolic encephalopathy: likely secondary to UTI.   Dementia: with behavioral disturbance. Avoid benzos. Haldol prn   Sinus bradycardia: continue on reduced dose of atenolol   HTN: continue on atenolol, lisinopril. IV hydralazine prn    Hypokalemia: will monitor intermittently    Severe protein calorie malnutrition: continue on nutritional supplements      DVT prophylaxis: lovenox  Code Status: DNR Family Communication:  Disposition Plan: medically stable. Unsafe d/c plan   Level of care: Med-Surg Status is: Inpatient Remains inpatient appropriate because: medically stable. Waiting on safe d/c plan     Consultants:    Procedures:   Antimicrobials:    Subjective: Pt is pleasantly confused  Objective: Vitals:   10/25/23 1556 10/25/23 2058 10/26/23 0609 10/26/23 0728  BP: 112/84 (!) 115/54 120/60 132/76  Pulse: 63 (!) 106 66   Resp: 16 17 16 16   Temp: 97.9 F (36.6 C) 98 F (36.7 C) 97.9 F (36.6 C) 97.8 F (36.6 C)  TempSrc:   Oral   SpO2: 98% 97% 100% 93%  Weight:      Height:        Intake/Output Summary (Last 24 hours) at 10/26/2023 0817 Last data filed at 10/25/2023 0930 Gross per 24 hour  Intake 240 ml  Output --  Net 240 ml    Filed Weights   10/08/23 0716  Weight: 44.2 kg    Examination:  General exam: appears confused  Respiratory system: clear breath sounds b/l Cardiovascular system: S1 &  S2+. No rubs or clicks  Gastrointestinal system: abd is soft, NT, ND & hypoactive bowel sounds  Central nervous system: alert & awake   Psychiatry: judgement and insight appears poor     Data Reviewed: I have personally reviewed following labs and imaging studies  CBC: No results for input(s): "WBC", "NEUTROABS", "HGB", "HCT", "MCV", "PLT" in the last 168 hours.  Basic Metabolic Panel: No results for input(s): "NA", "K", "CL", "CO2", "GLUCOSE", "BUN", "CREATININE", "CALCIUM", "MG", "PHOS" in the last 168 hours. GFR: Estimated Creatinine Clearance: 33.3 mL/min (by C-G formula based on SCr of 0.68 mg/dL). Liver Function Tests: No results for input(s): "AST", "ALT", "ALKPHOS", "BILITOT", "PROT", "ALBUMIN" in the last 168 hours. No results for input(s): "LIPASE", "AMYLASE" in the last 168 hours. No results for input(s): "AMMONIA" in the last 168 hours. Coagulation Profile: No results for input(s): "INR", "PROTIME" in the last 168 hours. Cardiac Enzymes: No results for input(s): "CKTOTAL", "CKMB", "CKMBINDEX", "TROPONINI" in the last 168 hours. BNP (last 3 results) No results for input(s): "PROBNP" in the last 8760 hours. HbA1C: No results for input(s): "HGBA1C" in the last 72 hours. CBG: No results for input(s): "GLUCAP" in the last 168 hours. Lipid Profile: No results for input(s): "CHOL", "HDL", "LDLCALC", "TRIG", "CHOLHDL", "LDLDIRECT" in the last 72 hours. Thyroid Function Tests: No results for input(s): "TSH", "T4TOTAL", "FREET4", "T3FREE", "THYROIDAB" in the last 72 hours. Anemia Panel: No results for input(s): "VITAMINB12", "  FOLATE", "FERRITIN", "TIBC", "IRON", "RETICCTPCT" in the last 72 hours. Sepsis Labs: No results for input(s): "PROCALCITON", "LATICACIDVEN" in the last 168 hours.  No results found for this or any previous visit (from the past 240 hour(s)).       Radiology Studies: No results found.      Scheduled Meds:  atenolol  50 mg Oral Daily    donepezil  10 mg Oral Daily   enoxaparin (LOVENOX) injection  40 mg Subcutaneous Q24H   feeding supplement  237 mL Oral BID BM   FLUoxetine  10 mg Oral Daily   levothyroxine  100 mcg Oral Q0600   lisinopril  40 mg Oral Daily   multivitamin with minerals  1 tablet Oral Daily   Continuous Infusions:   LOS: 30 days        Charise Killian, MD [facility] Pager [phone]  If 7PM-7AM, please contact night-coverage [email or web address] 10/26/2023, 8:17 AM

## 2023-10-26 NOTE — Plan of Care (Signed)

## 2023-10-26 NOTE — Plan of Care (Signed)
  Problem: Pain Managment: Goal: General experience of comfort will improve Outcome: Progressing   Problem: Skin Integrity: Goal: Risk for impaired skin integrity will decrease Outcome: Progressing   

## 2023-10-27 DIAGNOSIS — F0390 Unspecified dementia without behavioral disturbance: Secondary | ICD-10-CM | POA: Diagnosis not present

## 2023-10-27 LAB — BASIC METABOLIC PANEL
Anion gap: 8 (ref 5–15)
BUN: 25 mg/dL — ABNORMAL HIGH (ref 8–23)
CO2: 31 mmol/L (ref 22–32)
Calcium: 8.7 mg/dL — ABNORMAL LOW (ref 8.9–10.3)
Chloride: 100 mmol/L (ref 98–111)
Creatinine, Ser: 0.6 mg/dL (ref 0.44–1.00)
GFR, Estimated: >60 mL/min (ref >60)
Glucose, Bld: 88 mg/dL (ref 70–99)
Potassium: 4.4 mmol/L (ref 3.5–5.1)
Sodium: 139 mmol/L (ref 135–145)

## 2023-10-27 LAB — CBC
HCT: 37 % (ref 36.0–46.0)
Hemoglobin: 12.3 g/dL (ref 12.0–15.0)
MCH: 32.9 pg (ref 26.0–34.0)
MCHC: 33.2 g/dL (ref 30.0–36.0)
MCV: 98.9 fL (ref 80.0–100.0)
Platelets: 259 10*3/uL (ref 150–400)
RBC: 3.74 MIL/uL — ABNORMAL LOW (ref 3.87–5.11)
RDW: 15.6 % — ABNORMAL HIGH (ref 11.5–15.5)
WBC: 4.2 10*3/uL (ref 4.0–10.5)
nRBC: 0 % (ref 0.0–0.2)

## 2023-10-27 NOTE — Plan of Care (Signed)

## 2023-10-27 NOTE — Progress Notes (Signed)
PROGRESS NOTE   HPI was taken from Dr. Para [name]: Allison [name] is a 89 y.o. female with medical history significant for Dementia, hypertension, previously at [facility], now on home hospice, sent in by hospice via EMS as and APS case.  Hospice staff reported patient has very little oral intake and has not been taking any medications. Patient has baseline dementia with occasional aggression.  Patient did exhibit aggression in the emergency room, hitting out at staff and was administered Ativan.  Spoke with son on the phone Allison [name] who stated that for the past 3 weeks patient has been sleeping most of the day and eating very little.  States she came home from [place] back in July and she was able to ambulate independently then.  Currently ambulates very little. ED course and Data review: bradycardia in the 50s but otherwise normal vitals. Labs urinalysis consistent with UTI with positive nitrites moderate leuks and many bacteria.  WBC normal. Potassium 3 sodium 132, total bili 1.8 anion gap 18 with normal bicarb of 24 normal blood glucose 79. Started on Rocephin Hospitalist consulted for admission.   As per Dr. Mayford [name] 10/30-11/5/24: Pt has remained medically stable this week. Still unsafe d/c plan. See CM's notes.    Allison [name]  WUJ:[id number] DOB: 1932-08-08 DOA: 09/25/2023 PCP: Allison Mulder, MD   Assessment & Plan:   Principal Problem:   Urinary tract infection Active Problems:   Metabolic encephalopathy   Dementia with behavioral disturbance (HCC)   Hypokalemia   Sinus bradycardia   Inadequate oral intake   UTI (urinary tract infection)   Pressure injury of skin   Protein-calorie malnutrition, severe  Assessment and Plan:  UTI: urine cx grew e. coli, pseudomonas. Completed abx course w/ rocephin & cipro.   Acute metabolic encephalopathy: likely secondary to UTI.   Dementia: with behavioral disturbance. Avoid benzos. Haldol prn  Sinus bradycardia:  continue on reduced dose of atenolol   HTN: continue on lisinopril, atenolol. IV hydralazine    Hypokalemia: will monitor intermittently    Severe protein calorie malnutrition: continue on nutritional supplements     DVT prophylaxis: lovenox  Code Status: DNR Family Communication:  Disposition Plan: medically stable. Unsafe d/c plan   Level of care: Med-Surg Status is: Inpatient Remains inpatient appropriate because: medically stable. Unsafe d/c plan still     Consultants:    Procedures:   Antimicrobials:    Subjective: Pt is pleasantly confused   Objective: Vitals:   10/26/23 0609 10/26/23 0728 10/26/23 1540 10/26/23 1928  BP: 120/60 132/76 (!) 124/58 135/89  Pulse: 66  (!) 58 60  Resp: 16 16 16 16   Temp: 97.9 F (36.6 C) 97.8 F (36.6 C) 97.8 F (36.6 C) 97.9 F (36.6 C)  TempSrc: Oral     SpO2: 100% 93% 100% 93%  Weight:      Height:        Intake/Output Summary (Last 24 hours) at 10/27/2023 0756 Last data filed at 10/26/2023 1800 Gross per 24 hour  Intake 400 ml  Output --  Net 400 ml    Filed Weights   10/08/23 0716  Weight: 44.2 kg    Examination:  General exam: Appears confused  Respiratory system: clear breath sounds b/l  Cardiovascular system: S1/S2+. No rubs or clicks  Gastrointestinal system: abd is soft, NT, ND & hypoactive bowel sounds   Central nervous system: pt is lethargic  Psychiatry: judgement and insight appears poor      Data Reviewed:  I have personally reviewed following labs and imaging studies  CBC: No results for input(s): "WBC", "NEUTROABS", "HGB", "HCT", "MCV", "PLT" in the last 168 hours.  Basic Metabolic Panel: No results for input(s): "NA", "K", "CL", "CO2", "GLUCOSE", "BUN", "CREATININE", "CALCIUM", "MG", "PHOS" in the last 168 hours. GFR: Estimated Creatinine Clearance: 33.3 mL/min (by C-G formula based on SCr of 0.68 mg/dL). Liver Function Tests: No results for input(s): "AST", "ALT", "ALKPHOS",  "BILITOT", "PROT", "ALBUMIN" in the last 168 hours. No results for input(s): "LIPASE", "AMYLASE" in the last 168 hours. No results for input(s): "AMMONIA" in the last 168 hours. Coagulation Profile: No results for input(s): "INR", "PROTIME" in the last 168 hours. Cardiac Enzymes: No results for input(s): "CKTOTAL", "CKMB", "CKMBINDEX", "TROPONINI" in the last 168 hours. BNP (last 3 results) No results for input(s): "PROBNP" in the last 8760 hours. HbA1C: No results for input(s): "HGBA1C" in the last 72 hours. CBG: No results for input(s): "GLUCAP" in the last 168 hours. Lipid Profile: No results for input(s): "CHOL", "HDL", "LDLCALC", "TRIG", "CHOLHDL", "LDLDIRECT" in the last 72 hours. Thyroid Function Tests: No results for input(s): "TSH", "T4TOTAL", "FREET4", "T3FREE", "THYROIDAB" in the last 72 hours. Anemia Panel: No results for input(s): "VITAMINB12", "FOLATE", "FERRITIN", "TIBC", "IRON", "RETICCTPCT" in the last 72 hours. Sepsis Labs: No results for input(s): "PROCALCITON", "LATICACIDVEN" in the last 168 hours.  No results found for this or any previous visit (from the past 240 hour(s)).       Radiology Studies: No results found.      Scheduled Meds:  atenolol  50 mg Oral Daily   donepezil  10 mg Oral Daily   enoxaparin (LOVENOX) injection  40 mg Subcutaneous Q24H   feeding supplement  237 mL Oral BID BM   FLUoxetine  10 mg Oral Daily   levothyroxine  100 mcg Oral Q0600   lisinopril  40 mg Oral Daily   multivitamin with minerals  1 tablet Oral Daily   Continuous Infusions:   LOS: 31 days        Allison Killian, MD [facility] Pager [phone]  If 7PM-7AM, please contact night-coverage [email or web address] 10/27/2023, 7:56 AM

## 2023-10-28 DIAGNOSIS — N3000 Acute cystitis without hematuria: Secondary | ICD-10-CM | POA: Diagnosis not present

## 2023-10-28 NOTE — Progress Notes (Signed)
[facility] Progress Note  Patient: Allison [name]    WGN:[id number]  DOA: 09/25/2023     Date of Service: the patient was seen and examined on 10/28/2023  Chief Complaint  Patient presents with   Failure To Thrive   Brief hospital course: HPI was taken from Dr. Para March: Allison [name] is a 89 y.o. female with medical history significant for Dementia, hypertension, previously at [facility], now on home hospice, sent in by hospice via EMS as and APS case.  Hospice staff reported patient has very little oral intake and has not been taking any medications. Patient has baseline dementia with occasional aggression.  Patient did exhibit aggression in the emergency room, hitting out at staff and was administered Ativan.  Spoke with son on the phone Kemi Gell who stated that for the past 3 weeks patient has been sleeping most of the day and eating very little.  States she came home from [place] back in July and she was able to ambulate independently then.  Currently ambulates very little. ED course and Data review: bradycardia in the 50s but otherwise normal vitals. Labs urinalysis consistent with UTI with positive nitrites moderate leuks and many bacteria.  WBC normal. Potassium 3 sodium 132, total bili 1.8 anion gap 18 with normal bicarb of 24 normal blood glucose 79. Started on Rocephin Hospitalist consulted for admission.    As per Dr. Mayford Knife 10/30-11/5/24: Pt has remained medically stable this week. Still unsafe d/c plan. See CM's notes.    Assessment and Plan:  # UTI: urine cx grew e. coli, pseudomonas. Completed abx course w/ rocephin & cipro.    # Acute metabolic encephalopathy: likely secondary to UTI.    # Dementia: with behavioral disturbance. Avoid benzos. Haldol prn   # Sinus bradycardia: continue on reduced dose of atenolol    # HTN: continue on lisinopril, atenolol. IV hydralazine    # Hypokalemia: will monitor intermittently    # Severe protein  calorie malnutrition: continue on nutritional supplements   Body mass index is 15.73 kg/m.  Nutrition Problem: Severe Malnutrition Etiology: social / environmental circumstances Interventions:  Pressure Injury 09/26/23 Hip Anterior;Left;Proximal Stage 2 -  Partial thickness loss of dermis presenting as a shallow open injury with a red, pink wound bed without slough. (Active)  09/26/23 0230  Location: Hip  Location Orientation: Anterior;Left;Proximal  Staging: Stage 2 -  Partial thickness loss of dermis presenting as a shallow open injury with a red, pink wound bed without slough.  Wound Description (Comments):   Present on Admission: Yes  Dressing Type Foam - Lift dressing to assess site every shift 10/26/23 2022     Pressure Injury 10/14/23 Thigh Left;Posterior Stage 2 -  Partial thickness loss of dermis presenting as a shallow open injury with a red, pink wound bed without slough. (Active)  10/14/23 1124  Location: Thigh  Location Orientation: Left;Posterior  Staging: Stage 2 -  Partial thickness loss of dermis presenting as a shallow open injury with a red, pink wound bed without slough.  Wound Description (Comments):   Present on Admission:   Dressing Type Foam - Lift dressing to assess site every shift 10/26/23 2022     Pressure Injury 10/14/23 Ankle Left;Lateral Stage 1 -  Intact skin with non-blanchable redness of a localized area usually over a bony prominence. (Active)  10/14/23 1125  Location: Ankle  Location Orientation: Left;Lateral  Staging: Stage 1 -  Intact skin with non-blanchable redness of a localized area usually  over a bony prominence.  Wound Description (Comments):   Present on Admission:   Dressing Type Foam - Lift dressing to assess site every shift 10/26/23 2022     Diet: Dysphagia 2 diet DVT Prophylaxis: Subcutaneous Lovenox   Advance goals of care discussion: DNR  Family Communication: family was not present at bedside, at the time of interview.   The pt provided permission to discuss medical plan with the family. Opportunity was given to ask question and all questions were answered satisfactorily.   Disposition:  Pt is from home, admitted with UTI, dementia, medically stable to be discharged. TOC is following, no safe discharge plan at this time.   Subjective: No significant events overnight, patient is pleasantly confused, wearing mittens, unable to offer any complaints.  Physical Exam: General: NAD, lying comfortably, pleasantly confused Appear in no distress, affect confused Eyes: PERRLA ENT: Oral Mucosa Clear, moist  Neck: no JVD,  Cardiovascular: S1 and S2 Present, no Murmur,  Respiratory: good respiratory effort, Bilateral Air entry equal and Decreased, no Crackles, no wheezes Abdomen: Bowel Sound present, Soft and no tenderness,  Skin: no rashes Extremities: no Pedal edema, no calf tenderness Neurologic: without any new focal findings Gait not checked due to patient safety concerns  Vitals:   10/27/23 1546 10/27/23 1944 10/28/23 0523 10/28/23 0802  BP: 95/67 125/62 (!) 160/70 116/83  Pulse: 81 83 (!) 55 62  Resp: 16 17    Temp:  97.8 F (36.6 C) 97.8 F (36.6 C)   TempSrc:      SpO2: 94% 92% 100% 99%  Weight:      Height:       No intake or output data in the 24 hours ending 10/28/23 1530 Filed Weights   10/08/23 0716  Weight: 44.2 kg    Data Reviewed: I have personally reviewed and interpreted daily labs, tele strips, imagings as discussed above. I reviewed all nursing notes, pharmacy notes, vitals, pertinent old records I have discussed plan of care as described above with RN and patient/family.  CBC: Recent Labs  Lab 10/27/23 0849  WBC 4.2  HGB 12.3  HCT 37.0  MCV 98.9  PLT 259   Basic Metabolic Panel: Recent Labs  Lab 10/27/23 0849  NA 139  K 4.4  CL 100  CO2 31  GLUCOSE 88  BUN 25*  CREATININE 0.60  CALCIUM 8.7*    Studies: No results found.  Scheduled Meds:  atenolol  50  mg Oral Daily   donepezil  10 mg Oral Daily   enoxaparin (LOVENOX) injection  40 mg Subcutaneous Q24H   feeding supplement  237 mL Oral BID BM   FLUoxetine  10 mg Oral Daily   levothyroxine  100 mcg Oral Q0600   lisinopril  40 mg Oral Daily   multivitamin with minerals  1 tablet Oral Daily   Continuous Infusions: PRN Meds: acetaminophen **OR** acetaminophen, hydrALAZINE, ondansetron **OR** ondansetron (ZOFRAN) IV  Time spent: 35 minutes  Author: Gillis Santa. MD [facility] 10/28/2023 3:30 PM  To reach On-call, see care teams to locate the attending and reach out to them via [email or web address]. If 7PM-7AM, please contact night-coverage If you still have difficulty reaching the attending provider, please page the [facility] (Director on Call) for [facility] on amion for assistance.

## 2023-10-28 NOTE — Plan of Care (Signed)

## 2023-10-29 DIAGNOSIS — N3000 Acute cystitis without hematuria: Secondary | ICD-10-CM | POA: Diagnosis not present

## 2023-10-29 MED ORDER — POLYETHYLENE GLYCOL 3350 17 G PO PACK
17.0000 g | PACK | Freq: Two times a day (BID) | ORAL | Status: DC
  Administered 2023-10-29 – 2023-11-16 (×22): 17 g via ORAL
  Filled 2023-10-29 (×26): qty 1

## 2023-10-29 MED ORDER — BISACODYL 5 MG PO TBEC: 10.0000 mg | DELAYED_RELEASE_TABLET | Freq: Every day | ORAL | Status: DC | PRN

## 2023-10-29 NOTE — Progress Notes (Signed)
[facility] Progress Note  Patient: Allison [name]    WGN:[id number]  DOA: 09/25/2023     Date of Service: the patient was seen and examined on 10/29/2023  Chief Complaint  Patient presents with   Failure To Thrive   Brief hospital course: HPI was taken from Dr. Para March: Allison [name] is a 89 y.o. female with medical history significant for Dementia, hypertension, previously at [facility], now on home hospice, sent in by hospice via EMS as and APS case.  Hospice staff reported patient has very little oral intake and has not been taking any medications. Patient has baseline dementia with occasional aggression.  Patient did exhibit aggression in the emergency room, hitting out at staff and was administered Ativan.  Spoke with son on the phone Allison [name] who stated that for the past 3 weeks patient has been sleeping most of the day and eating very little.  States she came home from [place] back in July and she was able to ambulate independently then.  Currently ambulates very little. ED course and Data review: bradycardia in the 50s but otherwise normal vitals. Labs urinalysis consistent with UTI with positive nitrites moderate leuks and many bacteria.  WBC normal. Potassium 3 sodium 132, total bili 1.8 anion gap 18 with normal bicarb of 24 normal blood glucose 79. Started on Rocephin Hospitalist consulted for admission.    As per Dr. Mayford Knife 10/30-11/5/24: Pt has remained medically stable this week. Still unsafe d/c plan. See CM's notes.    Assessment and Plan:  # UTI: urine cx grew e. coli, pseudomonas. Completed abx course w/ rocephin & cipro.    # Acute metabolic encephalopathy: likely secondary to UTI.    # Dementia: with behavioral disturbance. Avoid benzos. Haldol prn   # Sinus bradycardia: continue on reduced dose of atenolol    # HTN: continue on lisinopril, atenolol. IV hydralazine    # Hypokalemia: will monitor intermittently    # Severe protein  calorie malnutrition: continue on nutritional supplements # Constipation, started stool softeners.  Body mass index is 15.73 kg/m.  Nutrition Problem: Severe Malnutrition Etiology: social / environmental circumstances Interventions:  Pressure Injury 09/26/23 Hip Anterior;Left;Proximal Stage 2 -  Partial thickness loss of dermis presenting as a shallow open injury with a red, pink wound bed without slough. (Active)  09/26/23 0230  Location: Hip  Location Orientation: Anterior;Left;Proximal  Staging: Stage 2 -  Partial thickness loss of dermis presenting as a shallow open injury with a red, pink wound bed without slough.  Wound Description (Comments):   Present on Admission: Yes  Dressing Type Other (Comment) 10/28/23 1940     Pressure Injury 10/14/23 Thigh Left;Posterior Stage 2 -  Partial thickness loss of dermis presenting as a shallow open injury with a red, pink wound bed without slough. (Active)  10/14/23 1124  Location: Thigh  Location Orientation: Left;Posterior  Staging: Stage 2 -  Partial thickness loss of dermis presenting as a shallow open injury with a red, pink wound bed without slough.  Wound Description (Comments):   Present on Admission:   Dressing Type Foam - Lift dressing to assess site every shift 10/26/23 2022     Pressure Injury 10/14/23 Ankle Left;Lateral Stage 1 -  Intact skin with non-blanchable redness of a localized area usually over a bony prominence. (Active)  10/14/23 1125  Location: Ankle  Location Orientation: Left;Lateral  Staging: Stage 1 -  Intact skin with non-blanchable redness of a localized area usually over a bony  prominence.  Wound Description (Comments):   Present on Admission:   Dressing Type Foam - Lift dressing to assess site every shift 10/26/23 2022     Diet: Dysphagia 2 diet DVT Prophylaxis: Subcutaneous Lovenox   Advance goals of care discussion: DNR  Family Communication: family was not present at bedside, at the time of  interview.  The pt provided permission to discuss medical plan with the family. Opportunity was given to ask question and all questions were answered satisfactorily.   Disposition:  Pt is from home, admitted with UTI, dementia, medically stable to be discharged. TOC is following, no safe discharge plan at this time.   Subjective: No significant events overnight, patient is pleasantly confused, wearing mittens, unable to offer any complaints.  Physical Exam: General: NAD, lying comfortably, pleasantly confused Appear in no distress, affect confused Eyes: PERRLA ENT: Oral Mucosa Clear, moist  Neck: no JVD,  Cardiovascular: S1 and S2 Present, no Murmur,  Respiratory: good respiratory effort, Bilateral Air entry equal and Decreased, no Crackles, no wheezes Abdomen: Bowel Sound present, Soft and no tenderness,  Skin: no rashes Extremities: no Pedal edema, no calf tenderness Neurologic: without any new focal findings Gait not checked due to patient safety concerns  Vitals:   10/28/23 1740 10/28/23 1947 10/29/23 0548 10/29/23 0818  BP: (!) 156/84 129/65 (!) 136/55 (!) 165/76  Pulse: (!) 54 65 (!) 58 (!) 59  Resp: 18     Temp: 98.1 F (36.7 C) 98.4 F (36.9 C) 97.6 F (36.4 C)   TempSrc:      SpO2: 91% 100% 100% 100%  Weight:      Height:       No intake or output data in the 24 hours ending 10/29/23 1531 Filed Weights   10/08/23 0716  Weight: 44.2 kg    Data Reviewed: I have personally reviewed and interpreted daily labs, tele strips, imagings as discussed above. I reviewed all nursing notes, pharmacy notes, vitals, pertinent old records I have discussed plan of care as described above with RN and patient/family.  CBC: Recent Labs  Lab 10/27/23 0849  WBC 4.2  HGB 12.3  HCT 37.0  MCV 98.9  PLT 259   Basic Metabolic Panel: Recent Labs  Lab 10/27/23 0849  NA 139  K 4.4  CL 100  CO2 31  GLUCOSE 88  BUN 25*  CREATININE 0.60  CALCIUM 8.7*    Studies: No  results found.  Scheduled Meds:  atenolol  50 mg Oral Daily   donepezil  10 mg Oral Daily   enoxaparin (LOVENOX) injection  40 mg Subcutaneous Q24H   feeding supplement  237 mL Oral BID BM   FLUoxetine  10 mg Oral Daily   levothyroxine  100 mcg Oral Q0600   lisinopril  40 mg Oral Daily   multivitamin with minerals  1 tablet Oral Daily   polyethylene glycol  17 g Oral BID   Continuous Infusions: PRN Meds: acetaminophen **OR** acetaminophen, bisacodyl, hydrALAZINE, ondansetron **OR** ondansetron (ZOFRAN) IV  Time spent: 35 minutes  Author: Gillis Santa. MD [facility] 10/29/2023 3:31 PM  To reach On-call, see care teams to locate the attending and reach out to them via [email or web address]. If 7PM-7AM, please contact night-coverage If you still have difficulty reaching the attending provider, please page the [facility] (Director on Call) for [facility] on amion for assistance.

## 2023-10-30 DIAGNOSIS — N3000 Acute cystitis without hematuria: Secondary | ICD-10-CM | POA: Diagnosis not present

## 2023-10-30 NOTE — Progress Notes (Signed)
[facility] Progress Note  Patient: Allison [name]    GNF:[id number]  DOA: 09/25/2023     Date of Service: the patient was seen and examined on 10/30/2023  Chief Complaint  Patient presents with   Failure To Thrive   Brief hospital course: HPI was taken from Dr. Para March: SAMEENA CORRIHER is a 89 y.o. female with medical history significant for Dementia, hypertension, previously at [facility], now on home hospice, sent in by hospice via EMS as and APS case.  Hospice staff reported patient has very little oral intake and has not been taking any medications. Patient has baseline dementia with occasional aggression.  Patient did exhibit aggression in the emergency room, hitting out at staff and was administered Ativan.  Spoke with son on the phone Karleen Waxler who stated that for the past 3 weeks patient has been sleeping most of the day and eating very little.  States she came home from [place] back in July and she was able to ambulate independently then.  Currently ambulates very little. ED course and Data review: bradycardia in the 50s but otherwise normal vitals. Labs urinalysis consistent with UTI with positive nitrites moderate leuks and many bacteria.  WBC normal. Potassium 3 sodium 132, total bili 1.8 anion gap 18 with normal bicarb of 24 normal blood glucose 79. Started on Rocephin Hospitalist consulted for admission.    As per Dr. Mayford Knife 10/30-11/5/24: Pt has remained medically stable this week. Still unsafe d/c plan. See CM's notes.    Assessment and Plan:  # UTI: urine cx grew e. coli, pseudomonas. Completed abx course w/ rocephin & cipro.    # Acute metabolic encephalopathy: likely secondary to UTI.    # Dementia: with behavioral disturbance. Avoid benzos. Haldol prn   # Sinus bradycardia: continue on reduced dose of atenolol    # HTN: continue on lisinopril, atenolol. IV hydralazine    # Hypokalemia: will monitor intermittently    # Severe protein  calorie malnutrition: continue on nutritional supplements # Constipation, started stool softeners.  Body mass index is 15.73 kg/m.  Nutrition Problem: Severe Malnutrition Etiology: social / environmental circumstances Interventions:  Pressure Injury 09/26/23 Hip Anterior;Left;Proximal Stage 2 -  Partial thickness loss of dermis presenting as a shallow open injury with a red, pink wound bed without slough. (Active)  09/26/23 0230  Location: Hip  Location Orientation: Anterior;Left;Proximal  Staging: Stage 2 -  Partial thickness loss of dermis presenting as a shallow open injury with a red, pink wound bed without slough.  Wound Description (Comments):   Present on Admission: Yes  Dressing Type Foam - Lift dressing to assess site every shift 10/29/23 2000     Pressure Injury 10/14/23 Thigh Left;Posterior Stage 2 -  Partial thickness loss of dermis presenting as a shallow open injury with a red, pink wound bed without slough. (Active)  10/14/23 1124  Location: Thigh  Location Orientation: Left;Posterior  Staging: Stage 2 -  Partial thickness loss of dermis presenting as a shallow open injury with a red, pink wound bed without slough.  Wound Description (Comments):   Present on Admission:   Dressing Type Foam - Lift dressing to assess site every shift 10/26/23 2022     Pressure Injury 10/14/23 Ankle Left;Lateral Stage 1 -  Intact skin with non-blanchable redness of a localized area usually over a bony prominence. (Active)  10/14/23 1125  Location: Ankle  Location Orientation: Left;Lateral  Staging: Stage 1 -  Intact skin with non-blanchable redness of  a localized area usually over a bony prominence.  Wound Description (Comments):   Present on Admission:   Dressing Type Foam - Lift dressing to assess site every shift 10/26/23 2022     Diet: Dysphagia 2 diet DVT Prophylaxis: Subcutaneous Lovenox   Advance goals of care discussion: DNR  Family Communication: family was not present  at bedside, at the time of interview.  The pt provided permission to discuss medical plan with the family. Opportunity was given to ask question and all questions were answered satisfactorily.   Disposition:  Pt is from home, admitted with UTI, dementia, medically stable to be discharged. TOC is following, no safe discharge plan at this time.   Subjective: No significant events overnight, patient was sleepy, did not wake her up today.  Seems to be resting comfortably, breathing without any distress.   Physical Exam: General: NAD, lying comfortably, sleepy today  Appear in no distress, affect confused Eyes: sleepy today ENT: Oral Mucosa Clear, moist  Neck: no JVD,  Cardiovascular: S1 and S2 Present, no Murmur,  Respiratory: good respiratory effort, Bilateral Air entry equal and Decreased, no Crackles, no wheezes Abdomen: Bowel Sound present, Soft and no tenderness,  Skin: no rashes Extremities: no Pedal edema, no calf tenderness Neurologic: without any new focal findings Gait not checked due to patient safety concerns  Vitals:   10/29/23 1633 10/29/23 2056 10/30/23 0443 10/30/23 0731  BP: (!) 127/56 (!) 123/44 (!) 144/77 (!) 143/76  Pulse: 77 68 62 63  Resp: 18 12 16 17   Temp: (!) 97.4 F (36.3 C) 97.7 F (36.5 C) 97.6 F (36.4 C) 98.1 F (36.7 C)  TempSrc:      SpO2: 91% 100% 100% 100%  Weight:      Height:        Intake/Output Summary (Last 24 hours) at 10/30/2023 1241 Last data filed at 10/30/2023 1052 Gross per 24 hour  Intake 120 ml  Output --  Net 120 ml   Filed Weights   10/08/23 0716  Weight: 44.2 kg    Data Reviewed: I have personally reviewed and interpreted daily labs, tele strips, imagings as discussed above. I reviewed all nursing notes, pharmacy notes, vitals, pertinent old records I have discussed plan of care as described above with RN and patient/family.  CBC: Recent Labs  Lab 10/27/23 0849  WBC 4.2  HGB 12.3  HCT 37.0  MCV 98.9  PLT 259    Basic Metabolic Panel: Recent Labs  Lab 10/27/23 0849  NA 139  K 4.4  CL 100  CO2 31  GLUCOSE 88  BUN 25*  CREATININE 0.60  CALCIUM 8.7*    Studies: No results found.  Scheduled Meds:  atenolol  50 mg Oral Daily   donepezil  10 mg Oral Daily   enoxaparin (LOVENOX) injection  40 mg Subcutaneous Q24H   feeding supplement  237 mL Oral BID BM   FLUoxetine  10 mg Oral Daily   levothyroxine  100 mcg Oral Q0600   lisinopril  40 mg Oral Daily   multivitamin with minerals  1 tablet Oral Daily   polyethylene glycol  17 g Oral BID   Continuous Infusions: PRN Meds: acetaminophen **OR** acetaminophen, bisacodyl, hydrALAZINE, ondansetron **OR** ondansetron (ZOFRAN) IV  Time spent: 25 minutes  Author: Gillis Santa. MD [facility] 10/30/2023 12:41 PM  To reach On-call, see care teams to locate the attending and reach out to them via [email or web address]. If 7PM-7AM, please contact night-coverage If you still have difficulty reaching  the attending provider, please page the [facility] (Director on Call) for [facility] on amion for assistance.

## 2023-10-31 DIAGNOSIS — N3000 Acute cystitis without hematuria: Secondary | ICD-10-CM | POA: Diagnosis not present

## 2023-10-31 NOTE — Progress Notes (Signed)
[facility] Progress Note  Patient: Allison [name]    ZOX:[id number]  DOA: 09/25/2023     Date of Service: the patient was seen and examined on 10/31/2023  Chief Complaint  Patient presents with   Failure To Thrive   Brief hospital course: HPI was taken from Dr. Para March: Allison [name] is a 89 y.o. female with medical history significant for Dementia, hypertension, previously at [facility], now on home hospice, sent in by hospice via EMS as and APS case.  Hospice staff reported patient has very little oral intake and has not been taking any medications. Patient has baseline dementia with occasional aggression.  Patient did exhibit aggression in the emergency room, hitting out at staff and was administered Ativan.  Spoke with son on the phone Allison [name] who stated that for the past 3 weeks patient has been sleeping most of the day and eating very little.  States she came home from [place] back in July and she was able to ambulate independently then.  Currently ambulates very little. ED course and Data review: bradycardia in the 50s but otherwise normal vitals. Labs urinalysis consistent with UTI with positive nitrites moderate leuks and many bacteria.  WBC normal. Potassium 3 sodium 132, total bili 1.8 anion gap 18 with normal bicarb of 24 normal blood glucose 79. Started on Rocephin Hospitalist consulted for admission.    As per Dr. Mayford Knife 10/30-11/5/24: Pt has remained medically stable this week. Still unsafe d/c plan. See CM's notes.    Assessment and Plan:  # UTI: urine cx grew e. coli, pseudomonas. Completed abx course w/ rocephin & cipro.    # Acute metabolic encephalopathy: likely secondary to UTI.    # Dementia: with behavioral disturbance. Avoid benzos. Haldol prn   # Sinus bradycardia: continue on reduced dose of atenolol    # HTN: continue on lisinopril, atenolol. IV hydralazine    # Hypokalemia: will monitor intermittently    # Severe protein  calorie malnutrition: continue on nutritional supplements # Constipation, started stool softeners.  Body mass index is 15.73 kg/m.  Nutrition Problem: Severe Malnutrition Etiology: social / environmental circumstances Interventions:  Pressure Injury 09/26/23 Hip Anterior;Left;Proximal Stage 2 -  Partial thickness loss of dermis presenting as a shallow open injury with a red, pink wound bed without slough. (Active)  09/26/23 0230  Location: Hip  Location Orientation: Anterior;Left;Proximal  Staging: Stage 2 -  Partial thickness loss of dermis presenting as a shallow open injury with a red, pink wound bed without slough.  Wound Description (Comments):   Present on Admission: Yes  Dressing Type Foam - Lift dressing to assess site every shift 10/30/23 1930     Pressure Injury 10/14/23 Thigh Left;Posterior Stage 2 -  Partial thickness loss of dermis presenting as a shallow open injury with a red, pink wound bed without slough. (Active)  10/14/23 1124  Location: Thigh  Location Orientation: Left;Posterior  Staging: Stage 2 -  Partial thickness loss of dermis presenting as a shallow open injury with a red, pink wound bed without slough.  Wound Description (Comments):   Present on Admission:   Dressing Type Foam - Lift dressing to assess site every shift 10/26/23 2022     Pressure Injury 10/14/23 Ankle Left;Lateral Stage 1 -  Intact skin with non-blanchable redness of a localized area usually over a bony prominence. (Active)  10/14/23 1125  Location: Ankle  Location Orientation: Left;Lateral  Staging: Stage 1 -  Intact skin with non-blanchable redness of  a localized area usually over a bony prominence.  Wound Description (Comments):   Present on Admission:   Dressing Type Foam - Lift dressing to assess site every shift 10/26/23 2022     Diet: Dysphagia 2 diet DVT Prophylaxis: Subcutaneous Lovenox   Advance goals of care discussion: DNR  Family Communication: family was not present  at bedside, at the time of interview.  The pt provided permission to discuss medical plan with the family. Opportunity was given to ask question and all questions were answered satisfactorily.   Disposition:  Pt is from home, admitted with UTI, dementia, medically stable to be discharged. TOC is following, no safe discharge plan at this time.   Subjective: No significant events overnight, patient was awake today, eating breakfast with the help of staff, unable to offer any complaints, AAO x 0.   Physical Exam: General: NAD, lying comfortably,  Appear in no distress, affect confused Eyes: PERRLA ENT: Oral Mucosa Clear, moist  Neck: no JVD,  Cardiovascular: S1 and S2 Present, no Murmur,  Respiratory: good respiratory effort, Bilateral Air entry equal and Decreased, no Crackles, no wheezes Abdomen: Bowel Sound present, Soft and no tenderness,  Skin: no rashes Extremities: no Pedal edema, no calf tenderness Neurologic: without any new focal findings Gait not checked due to patient safety concerns  Vitals:   10/30/23 1513 10/30/23 1958 10/31/23 0421 10/31/23 0426  BP: 119/87 (!) 150/83 (!) 160/75 (!) (P) 152/75  Pulse: 63 69 61   Resp: 18 16 20    Temp: 97.9 F (36.6 C) 98 F (36.7 C) 97.6 F (36.4 C)   TempSrc:      SpO2: 97% 99% 100%   Weight:      Height:        Intake/Output Summary (Last 24 hours) at 10/31/2023 1138 Last data filed at 10/31/2023 1034 Gross per 24 hour  Intake 440 ml  Output --  Net 440 ml   Filed Weights   10/08/23 0716  Weight: 44.2 kg    Data Reviewed: I have personally reviewed and interpreted daily labs, tele strips, imagings as discussed above. I reviewed all nursing notes, pharmacy notes, vitals, pertinent old records I have discussed plan of care as described above with RN and patient/family.  CBC: Recent Labs  Lab 10/27/23 0849  WBC 4.2  HGB 12.3  HCT 37.0  MCV 98.9  PLT 259   Basic Metabolic Panel: Recent Labs  Lab  10/27/23 0849  NA 139  K 4.4  CL 100  CO2 31  GLUCOSE 88  BUN 25*  CREATININE 0.60  CALCIUM 8.7*    Studies: No results found.  Scheduled Meds:  atenolol  50 mg Oral Daily   donepezil  10 mg Oral Daily   enoxaparin (LOVENOX) injection  40 mg Subcutaneous Q24H   feeding supplement  237 mL Oral BID BM   FLUoxetine  10 mg Oral Daily   levothyroxine  100 mcg Oral Q0600   lisinopril  40 mg Oral Daily   multivitamin with minerals  1 tablet Oral Daily   polyethylene glycol  17 g Oral BID   Continuous Infusions: PRN Meds: acetaminophen **OR** acetaminophen, bisacodyl, hydrALAZINE, ondansetron **OR** ondansetron (ZOFRAN) IV  Time spent: 25 minutes  Author: Gillis Santa. MD [facility] 10/31/2023 11:38 AM  To reach On-call, see care teams to locate the attending and reach out to them via [email or web address]. If 7PM-7AM, please contact night-coverage If you still have difficulty reaching the attending provider, please page the [facility] (  Director on Call) for [facility] on [place] for assistance.

## 2023-11-01 DIAGNOSIS — N3000 Acute cystitis without hematuria: Secondary | ICD-10-CM | POA: Diagnosis not present

## 2023-11-01 NOTE — Progress Notes (Signed)
Patient refuses full skin assessment.  Unable to assess documented wounds due to increased verbalization and attempted physical violence.

## 2023-11-01 NOTE — Progress Notes (Signed)
[facility] Progress Note  Patient: Allison [name]    ZJI:[id number]  DOA: 09/25/2023     Date of Service: the patient was seen and examined on 11/01/2023  Chief Complaint  Patient presents with   Failure To Thrive   Brief hospital course: HPI was taken from Dr. Para March: Allison [name] is a 89 y.o. female with medical history significant for Dementia, hypertension, previously at [facility], now on home hospice, sent in by hospice via EMS as and APS case.  Hospice staff reported patient has very little oral intake and has not been taking any medications. Patient has baseline dementia with occasional aggression.  Patient did exhibit aggression in the emergency room, hitting out at staff and was administered Ativan.  Spoke with son on the phone Allison [name] who stated that for the past 3 weeks patient has been sleeping most of the day and eating very little.  States she came home from [place] back in July and she was able to ambulate independently then.  Currently ambulates very little. ED course and Data review: bradycardia in the 50s but otherwise normal vitals. Labs urinalysis consistent with UTI with positive nitrites moderate leuks and many bacteria.  WBC normal. Potassium 3 sodium 132, total bili 1.8 anion gap 18 with normal bicarb of 24 normal blood glucose 79. Started on Rocephin Hospitalist consulted for admission.    As per Dr. Mayford Knife 10/30-11/5/24: Pt has remained medically stable this week. Still unsafe d/c plan. See CM's notes.    Assessment and Plan:  # UTI: urine cx grew e. coli, pseudomonas. Completed abx course w/ rocephin & cipro.    # Acute metabolic encephalopathy: likely secondary to UTI.    # Dementia: with behavioral disturbance. Avoid benzos. Haldol prn   # Sinus bradycardia: continue on reduced dose of atenolol    # HTN: continue on lisinopril, atenolol. IV hydralazine    # Hypokalemia: will monitor intermittently    # Severe protein  calorie malnutrition: continue on nutritional supplements # Constipation, started stool softeners.  Body mass index is 15.73 kg/m.  Nutrition Problem: Severe Malnutrition Etiology: social / environmental circumstances Interventions:  Pressure Injury 09/26/23 Hip Anterior;Left;Proximal Stage 2 -  Partial thickness loss of dermis presenting as a shallow open injury with a red, pink wound bed without slough. (Active)  09/26/23 0230  Location: Hip  Location Orientation: Anterior;Left;Proximal  Staging: Stage 2 -  Partial thickness loss of dermis presenting as a shallow open injury with a red, pink wound bed without slough.  Wound Description (Comments):   Present on Admission: Yes  Dressing Type Foam - Lift dressing to assess site every shift 10/31/23 2000     Pressure Injury 10/14/23 Thigh Left;Posterior Stage 2 -  Partial thickness loss of dermis presenting as a shallow open injury with a red, pink wound bed without slough. (Active)  10/14/23 1124  Location: Thigh  Location Orientation: Left;Posterior  Staging: Stage 2 -  Partial thickness loss of dermis presenting as a shallow open injury with a red, pink wound bed without slough.  Wound Description (Comments):   Present on Admission:   Dressing Type Foam - Lift dressing to assess site every shift 10/26/23 2022     Pressure Injury 10/14/23 Ankle Left;Lateral Stage 1 -  Intact skin with non-blanchable redness of a localized area usually over a bony prominence. (Active)  10/14/23 1125  Location: Ankle  Location Orientation: Left;Lateral  Staging: Stage 1 -  Intact skin with non-blanchable redness of  a localized area usually over a bony prominence.  Wound Description (Comments):   Present on Admission:   Dressing Type Foam - Lift dressing to assess site every shift 10/26/23 2022     Diet: Dysphagia 2 diet DVT Prophylaxis: Subcutaneous Lovenox   Advance goals of care discussion: DNR  Family Communication: family was not present  at bedside, at the time of interview.  The pt provided permission to discuss medical plan with the family. Opportunity was given to ask question and all questions were answered satisfactorily.   Disposition:  Pt is from home, admitted with UTI, dementia, medically stable to be discharged. TOC is following, no safe discharge plan at this time.   Subjective: No significant events overnight, patient was lying comfortably in the bed, awake and disoriented, pleasantly confused.  He stated that she is feeling fine, did not offer any complaints.  Seems to be resting comfortably.   Physical Exam: General: NAD, lying comfortably,  Appear in no distress, affect confused Eyes: PERRLA ENT: Oral Mucosa Clear, moist  Neck: no JVD,  Cardiovascular: S1 and S2 Present, no Murmur,  Respiratory: good respiratory effort, Bilateral Air entry equal and Decreased, no Crackles, no wheezes Abdomen: Bowel Sound present, Soft and no tenderness,  Skin: no rashes Extremities: no Pedal edema, no calf tenderness Neurologic: without any new focal findings Gait not checked due to patient safety concerns  Vitals:   10/31/23 1649 10/31/23 2050 11/01/23 0459 11/01/23 0825  BP: 114/84 119/68 (!) 153/75 96/80  Pulse: 81 65 62 96  Resp: 18 12 12 19   Temp: 97.7 F (36.5 C) 97.8 F (36.6 C)    TempSrc:  Oral    SpO2: 94% 100% 96%   Weight:      Height:        Intake/Output Summary (Last 24 hours) at 11/01/2023 1214 Last data filed at 10/31/2023 1759 Gross per 24 hour  Intake 600 ml  Output --  Net 600 ml   Filed Weights   10/08/23 0716  Weight: 44.2 kg    Data Reviewed: I have personally reviewed and interpreted daily labs, tele strips, imagings as discussed above. I reviewed all nursing notes, pharmacy notes, vitals, pertinent old records I have discussed plan of care as described above with RN and patient/family.  CBC: Recent Labs  Lab 10/27/23 0849  WBC 4.2  HGB 12.3  HCT 37.0  MCV 98.9  PLT  259   Basic Metabolic Panel: Recent Labs  Lab 10/27/23 0849  NA 139  K 4.4  CL 100  CO2 31  GLUCOSE 88  BUN 25*  CREATININE 0.60  CALCIUM 8.7*    Studies: No results found.  Scheduled Meds:  atenolol  50 mg Oral Daily   donepezil  10 mg Oral Daily   enoxaparin (LOVENOX) injection  40 mg Subcutaneous Q24H   feeding supplement  237 mL Oral BID BM   FLUoxetine  10 mg Oral Daily   levothyroxine  100 mcg Oral Q0600   lisinopril  40 mg Oral Daily   multivitamin with minerals  1 tablet Oral Daily   polyethylene glycol  17 g Oral BID   Continuous Infusions: PRN Meds: acetaminophen **OR** acetaminophen, bisacodyl, hydrALAZINE, ondansetron **OR** ondansetron (ZOFRAN) IV  Time spent: 25 minutes  Author: Gillis Santa. MD [facility] 11/01/2023 12:14 PM  To reach On-call, see care teams to locate the attending and reach out to them via [email or web address]. If 7PM-7AM, please contact night-coverage If you still have difficulty reaching the  attending provider, please page the [facility] (Director on Call) for [facility] on amion for assistance.

## 2023-11-02 DIAGNOSIS — N3000 Acute cystitis without hematuria: Secondary | ICD-10-CM | POA: Diagnosis not present

## 2023-11-02 NOTE — Progress Notes (Signed)
[facility] Progress Note  Patient: Allison [name]    ZOX:[id number]  DOA: 09/25/2023     Date of Service: the patient was seen and examined on 11/02/2023  Chief Complaint  Patient presents with   Failure To Thrive   Brief hospital course: HPI was taken from Dr. Para March: Allison [name] is a 89 y.o. female with medical history significant for Dementia, hypertension, previously at [facility], now on home hospice, sent in by hospice via EMS as and APS case.  Hospice staff reported patient has very little oral intake and has not been taking any medications. Patient has baseline dementia with occasional aggression.  Patient did exhibit aggression in the emergency room, hitting out at staff and was administered Ativan.  Spoke with son on the phone Allison [name] who stated that for the past 3 weeks patient has been sleeping most of the day and eating very little.  States she came home from [place] back in July and she was able to ambulate independently then.  Currently ambulates very little. ED course and Data review: bradycardia in the 50s but otherwise normal vitals. Labs urinalysis consistent with UTI with positive nitrites moderate leuks and many bacteria.  WBC normal. Potassium 3 sodium 132, total bili 1.8 anion gap 18 with normal bicarb of 24 normal blood glucose 79. Started on Rocephin Hospitalist consulted for admission.    As per Dr. Mayford Knife 10/30-11/5/24: Pt has remained medically stable this week. Still unsafe d/c plan. See CM's notes.    Assessment and Plan:  # UTI: urine cx grew e. coli, pseudomonas. Completed abx course w/ rocephin & cipro.    # Acute metabolic encephalopathy: likely secondary to UTI.    # Dementia: with behavioral disturbance. Avoid benzos. Haldol prn   # Sinus bradycardia: continue on reduced dose of atenolol    # HTN: continue on lisinopril, atenolol. IV hydralazine    # Hypokalemia: will monitor intermittently    # Severe protein  calorie malnutrition: continue on nutritional supplements # Constipation, started stool softeners.  Body mass index is 15.73 kg/m.  Nutrition Problem: Severe Malnutrition Etiology: social / environmental circumstances Interventions:  Pressure Injury 09/26/23 Hip Anterior;Left;Proximal Stage 2 -  Partial thickness loss of dermis presenting as a shallow open injury with a red, pink wound bed without slough. (Active)  09/26/23 0230  Location: Hip  Location Orientation: Anterior;Left;Proximal  Staging: Stage 2 -  Partial thickness loss of dermis presenting as a shallow open injury with a red, pink wound bed without slough.  Wound Description (Comments):   Present on Admission: Yes  Dressing Type Foam - Lift dressing to assess site every shift 11/02/23 0921     Pressure Injury 10/14/23 Thigh Left;Posterior Stage 2 -  Partial thickness loss of dermis presenting as a shallow open injury with a red, pink wound bed without slough. (Active)  10/14/23 1124  Location: Thigh  Location Orientation: Left;Posterior  Staging: Stage 2 -  Partial thickness loss of dermis presenting as a shallow open injury with a red, pink wound bed without slough.  Wound Description (Comments):   Present on Admission:   Dressing Type Foam - Lift dressing to assess site every shift 10/26/23 2022     Pressure Injury 10/14/23 Ankle Left;Lateral Stage 1 -  Intact skin with non-blanchable redness of a localized area usually over a bony prominence. (Active)  10/14/23 1125  Location: Ankle  Location Orientation: Left;Lateral  Staging: Stage 1 -  Intact skin with non-blanchable redness of  a localized area usually over a bony prominence.  Wound Description (Comments):   Present on Admission:   Dressing Type Foam - Lift dressing to assess site every shift 10/26/23 2022     Diet: Dysphagia 2 diet DVT Prophylaxis: Subcutaneous Lovenox   Advance goals of care discussion: DNR  Family Communication: family was not present  at bedside, at the time of interview.  The pt provided permission to discuss medical plan with the family. Opportunity was given to ask question and all questions were answered satisfactorily.   Disposition:  Pt is from home, admitted with UTI, dementia, medically stable to be discharged. TOC is following, no safe discharge plan at this time.   Subjective: No significant events overnight, patient was lying comfortably in the bed, remained confused, denies any complaints.   Physical Exam: General: NAD, lying comfortably,  Appear in no distress, affect confused Eyes: PERRLA ENT: Oral Mucosa Clear, moist  Neck: no JVD,  Cardiovascular: S1 and S2 Present, no Murmur,  Respiratory: good respiratory effort, Bilateral Air entry equal and Decreased, no Crackles, no wheezes Abdomen: Bowel Sound present, Soft and no tenderness,  Skin: no rashes Extremities: no Pedal edema, no calf tenderness Neurologic: without any new focal findings Gait not checked due to patient safety concerns  Vitals:   11/01/23 0825 11/01/23 1514 11/02/23 0516 11/02/23 0737  BP: 96/80 117/65 (!) 148/69 97/82  Pulse: 96 61 66 61  Resp: 19  16 18   Temp:   97.6 F (36.4 C) 98.8 F (37.1 C)  TempSrc:   Oral   SpO2:   97% 98%  Weight:      Height:        Intake/Output Summary (Last 24 hours) at 11/02/2023 1454 Last data filed at 11/02/2023 1300 Gross per 24 hour  Intake 600 ml  Output --  Net 600 ml   Filed Weights   10/08/23 0716  Weight: 44.2 kg    Data Reviewed: I have personally reviewed and interpreted daily labs, tele strips, imagings as discussed above. I reviewed all nursing notes, pharmacy notes, vitals, pertinent old records I have discussed plan of care as described above with RN and patient/family.  CBC: Recent Labs  Lab 10/27/23 0849  WBC 4.2  HGB 12.3  HCT 37.0  MCV 98.9  PLT 259   Basic Metabolic Panel: Recent Labs  Lab 10/27/23 0849  NA 139  K 4.4  CL 100  CO2 31   GLUCOSE 88  BUN 25*  CREATININE 0.60  CALCIUM 8.7*    Studies: No results found.  Scheduled Meds:  atenolol  50 mg Oral Daily   donepezil  10 mg Oral Daily   enoxaparin (LOVENOX) injection  40 mg Subcutaneous Q24H   feeding supplement  237 mL Oral BID BM   FLUoxetine  10 mg Oral Daily   levothyroxine  100 mcg Oral Q0600   lisinopril  40 mg Oral Daily   multivitamin with minerals  1 tablet Oral Daily   polyethylene glycol  17 g Oral BID   Continuous Infusions: PRN Meds: acetaminophen **OR** acetaminophen, bisacodyl, hydrALAZINE, ondansetron **OR** ondansetron (ZOFRAN) IV  Time spent: 25 minutes  Author: Gillis Santa. MD [facility] 11/02/2023 2:54 PM  To reach On-call, see care teams to locate the attending and reach out to them via [email or web address]. If 7PM-7AM, please contact night-coverage If you still have difficulty reaching the attending provider, please page the [facility] (Director on Call) for [facility] on amion for assistance.

## 2023-11-02 NOTE — Plan of Care (Signed)

## 2023-11-03 DIAGNOSIS — N3000 Acute cystitis without hematuria: Secondary | ICD-10-CM | POA: Diagnosis not present

## 2023-11-03 NOTE — Progress Notes (Signed)
[facility] Progress Note  Patient: Allison [name]    YQM:[id number]  DOA: 09/25/2023     Date of Service: the patient was seen and examined on 11/03/2023  Chief Complaint  Patient presents with   Failure To Thrive   Brief hospital course: HPI was taken from Dr. Para March: SUSANNAH KROLIK is a 89 y.o. female with medical history significant for Dementia, hypertension, previously at [facility], now on home hospice, sent in by hospice via EMS as and APS case.  Hospice staff reported patient has very little oral intake and has not been taking any medications. Patient has baseline dementia with occasional aggression.  Patient did exhibit aggression in the emergency room, hitting out at staff and was administered Ativan.  Spoke with son on the phone Dessirae Raina who stated that for the past 3 weeks patient has been sleeping most of the day and eating very little.  States she came home from [place] back in July and she was able to ambulate independently then.  Currently ambulates very little. ED course and Data review: bradycardia in the 50s but otherwise normal vitals. Labs urinalysis consistent with UTI with positive nitrites moderate leuks and many bacteria.  WBC normal. Potassium 3 sodium 132, total bili 1.8 anion gap 18 with normal bicarb of 24 normal blood glucose 79. Started on Rocephin Hospitalist consulted for admission.    As per Dr. Mayford Knife 10/30-11/5/24: Pt has remained medically stable this week. Still unsafe d/c plan. See CM's notes.    Assessment and Plan:  # UTI: urine cx grew e. coli, pseudomonas. Completed abx course w/ rocephin & cipro.    # Acute metabolic encephalopathy: likely secondary to UTI.    # Dementia: with behavioral disturbance. Avoid benzos. Haldol prn   # Sinus bradycardia: continue on reduced dose of atenolol    # HTN: continue on lisinopril, atenolol. IV hydralazine    # Hypokalemia: will monitor intermittently    # Severe protein  calorie malnutrition: continue on nutritional supplements # Constipation, started stool softeners.  Body mass index is 15.73 kg/m.  Nutrition Problem: Severe Malnutrition Etiology: social / environmental circumstances Interventions:  Pressure Injury 09/26/23 Hip Anterior;Left;Proximal Stage 2 -  Partial thickness loss of dermis presenting as a shallow open injury with a red, pink wound bed without slough. (Active)  09/26/23 0230  Location: Hip  Location Orientation: Anterior;Left;Proximal  Staging: Stage 2 -  Partial thickness loss of dermis presenting as a shallow open injury with a red, pink wound bed without slough.  Wound Description (Comments):   Present on Admission: Yes  Dressing Type Foam - Lift dressing to assess site every shift 11/03/23 1000     Pressure Injury 10/14/23 Thigh Left;Posterior Stage 2 -  Partial thickness loss of dermis presenting as a shallow open injury with a red, pink wound bed without slough. (Active)  10/14/23 1124  Location: Thigh  Location Orientation: Left;Posterior  Staging: Stage 2 -  Partial thickness loss of dermis presenting as a shallow open injury with a red, pink wound bed without slough.  Wound Description (Comments):   Present on Admission:   Dressing Type Foam - Lift dressing to assess site every shift 10/26/23 2022     Pressure Injury 10/14/23 Ankle Left;Lateral Stage 1 -  Intact skin with non-blanchable redness of a localized area usually over a bony prominence. (Active)  10/14/23 1125  Location: Ankle  Location Orientation: Left;Lateral  Staging: Stage 1 -  Intact skin with non-blanchable redness of  a localized area usually over a bony prominence.  Wound Description (Comments):   Present on Admission:   Dressing Type Foam - Lift dressing to assess site every shift 10/26/23 2022     Diet: Dysphagia 2 diet DVT Prophylaxis: Subcutaneous Lovenox   Advance goals of care discussion: DNR  Family Communication: family was not present  at bedside, at the time of interview.  The pt provided permission to discuss medical plan with the family. Opportunity was given to ask question and all questions were answered satisfactorily.   Disposition:  Pt is from home, admitted with UTI, dementia, medically stable to be discharged. TOC is following, no safe discharge plan at this time.   Subjective: No significant events overnight, patient remained confused and disoriented, lying comfortably, no active issues.  Physical Exam: General: NAD, lying comfortably,  Appear in no distress, affect confused Eyes: PERRLA ENT: Oral Mucosa Clear, moist  Neck: no JVD,  Cardiovascular: S1 and S2 Present, no Murmur,  Respiratory: good respiratory effort, Bilateral Air entry equal and Decreased, no Crackles, no wheezes Abdomen: Bowel Sound present, Soft and no tenderness,  Skin: no rashes Extremities: no Pedal edema, no calf tenderness Neurologic: without any new focal findings Gait not checked due to patient safety concerns  Vitals:   11/02/23 0737 11/03/23 0502 11/03/23 0829 11/03/23 0953  BP: 97/82 (!) 129/53 113/75 (!) 116/53  Pulse: 61 (!) 59 72 73  Resp: 18 18 16    Temp: 98.8 F (37.1 C) 98.6 F (37 C) 97.9 F (36.6 C)   TempSrc:   Oral   SpO2: 98%  99%   Weight:      Height:        Intake/Output Summary (Last 24 hours) at 11/03/2023 1339 Last data filed at 11/02/2023 1752 Gross per 24 hour  Intake 300 ml  Output --  Net 300 ml   Filed Weights   10/08/23 0716  Weight: 44.2 kg    Data Reviewed: I have personally reviewed and interpreted daily labs, tele strips, imagings as discussed above. I reviewed all nursing notes, pharmacy notes, vitals, pertinent old records I have discussed plan of care as described above with RN and patient/family.  CBC: No results for input(s): "WBC", "NEUTROABS", "HGB", "HCT", "MCV", "PLT" in the last 168 hours.  Basic Metabolic Panel: No results for input(s): "NA", "K", "CL", "CO2",  "GLUCOSE", "BUN", "CREATININE", "CALCIUM", "MG", "PHOS" in the last 168 hours.   Studies: No results found.  Scheduled Meds:  atenolol  50 mg Oral Daily   donepezil  10 mg Oral Daily   enoxaparin (LOVENOX) injection  40 mg Subcutaneous Q24H   feeding supplement  237 mL Oral BID BM   FLUoxetine  10 mg Oral Daily   levothyroxine  100 mcg Oral Q0600   lisinopril  40 mg Oral Daily   multivitamin with minerals  1 tablet Oral Daily   polyethylene glycol  17 g Oral BID   Continuous Infusions: PRN Meds: acetaminophen **OR** acetaminophen, bisacodyl, hydrALAZINE, ondansetron **OR** ondansetron (ZOFRAN) IV  Time spent: 25 minutes  Author: Gillis Santa. MD [facility] 11/03/2023 1:39 PM  To reach On-call, see care teams to locate the attending and reach out to them via [email or web address]. If 7PM-7AM, please contact night-coverage If you still have difficulty reaching the attending provider, please page the [facility] (Director on Call) for [facility] on amion for assistance.

## 2023-11-04 DIAGNOSIS — N3000 Acute cystitis without hematuria: Secondary | ICD-10-CM | POA: Diagnosis not present

## 2023-11-04 NOTE — Progress Notes (Signed)
Brief Nutrition Follow-Up Note  Wt Readings from Last 15 Encounters:  01/07/23 56.7 kg  07/15/22 50.8 kg  06/04/22 60.2 kg   89 y/o female with h/o dementia, bradycardia, asthma, HTN and followed by hospice who is admitted with UTI and AMS.  10/30- downgraded to dysphagia 2 diet by SLP   Noted pt with instances of refusing care.   Pt able to feed self and is drinking Ensure supplements.   Per TOC notes, pt is medically stable and awaiting placement. DSS following; family working on selling assets so pt can be eligible for Medicaid.   Interventions:   -Continue Ensure Enlive po BID, each supplement provides 350 kcal and 20 grams of protein.  -Continue Magic cup TID with meals, each supplement provides 290 kcal and 9 grams of protein  -Continue dysphagia 2 diet  -Continue MVI with minerals daily   Current diet order is dysphagia 2 diet with liquids, patient is consuming approximately 75-100% of meals at this time. Labs and medications reviewed.   No nutrition interventions warranted at this time. If nutrition issues arise, please consult RD.   Levada Schilling, RD, LDN, CDCES Registered Dietitian III Certified Diabetes Care and Education Specialist Please refer to [facility] for RD and/or RD on-call/weekend/after hours pager

## 2023-11-04 NOTE — Progress Notes (Signed)
[facility] Progress Note  Patient: Allison [name]    URK:[id number]  DOA: 09/25/2023     Date of Service: the patient was seen and examined on 11/04/2023  Chief Complaint  Patient presents with   Failure To Thrive   Brief hospital course: HPI was taken from Dr. Para March: Allison [name] is a 89 y.o. female with medical history significant for Dementia, hypertension, previously at [facility], now on home hospice, sent in by hospice via EMS as and APS case.  Hospice staff reported patient has very little oral intake and has not been taking any medications. Patient has baseline dementia with occasional aggression.  Patient did exhibit aggression in the emergency room, hitting out at staff and was administered Ativan.  Spoke with son on the phone Allison [name] who stated that for the past 3 weeks patient has been sleeping most of the day and eating very little.  States she came home from [place] back in July and she was able to ambulate independently then.  Currently ambulates very little. ED course and Data review: bradycardia in the 50s but otherwise normal vitals. Labs urinalysis consistent with UTI with positive nitrites moderate leuks and many bacteria.  WBC normal. Potassium 3 sodium 132, total bili 1.8 anion gap 18 with normal bicarb of 24 normal blood glucose 79. Started on Rocephin Hospitalist consulted for admission.    As per Dr. Mayford Knife 10/30-11/5/24: Pt has remained medically stable this week. Still unsafe d/c plan. See CM's notes.    Assessment and Plan:  # UTI: urine cx grew e. coli, pseudomonas. Completed abx course w/ rocephin & cipro.    # Acute metabolic encephalopathy: likely secondary to UTI.    # Dementia: with behavioral disturbance. Avoid benzos. Haldol prn   # Sinus bradycardia: continue on reduced dose of atenolol    # HTN: continue on lisinopril, atenolol. IV hydralazine    # Hypokalemia: will monitor intermittently    # Severe protein  calorie malnutrition: continue on nutritional supplements # Constipation, started stool softeners.  Body mass index is 15.73 kg/m.  Nutrition Problem: Severe Malnutrition Etiology: social / environmental circumstances Interventions:  Pressure Injury 09/26/23 Hip Anterior;Left;Proximal Stage 2 -  Partial thickness loss of dermis presenting as a shallow open injury with a red, pink wound bed without slough. (Active)  09/26/23 0230  Location: Hip  Location Orientation: Anterior;Left;Proximal  Staging: Stage 2 -  Partial thickness loss of dermis presenting as a shallow open injury with a red, pink wound bed without slough.  Wound Description (Comments):   Present on Admission: Yes  Dressing Type Foam - Lift dressing to assess site every shift 11/03/23 1945     Pressure Injury 10/14/23 Thigh Left;Posterior Stage 2 -  Partial thickness loss of dermis presenting as a shallow open injury with a red, pink wound bed without slough. (Active)  10/14/23 1124  Location: Thigh  Location Orientation: Left;Posterior  Staging: Stage 2 -  Partial thickness loss of dermis presenting as a shallow open injury with a red, pink wound bed without slough.  Wound Description (Comments):   Present on Admission:   Dressing Type Foam - Lift dressing to assess site every shift 10/26/23 2022     Pressure Injury 10/14/23 Ankle Left;Lateral Stage 1 -  Intact skin with non-blanchable redness of a localized area usually over a bony prominence. (Active)  10/14/23 1125  Location: Ankle  Location Orientation: Left;Lateral  Staging: Stage 1 -  Intact skin with non-blanchable redness of  a localized area usually over a bony prominence.  Wound Description (Comments):   Present on Admission:   Dressing Type Foam - Lift dressing to assess site every shift 10/26/23 2022     Diet: Dysphagia 2 diet DVT Prophylaxis: Subcutaneous Lovenox   Advance goals of care discussion: DNR  Family Communication: family was not present  at bedside, at the time of interview.  The pt provided permission to discuss medical plan with the family. Opportunity was given to ask question and all questions were answered satisfactorily.   Disposition:  Pt is from home, admitted with UTI, dementia, medically stable to be discharged. TOC is following, no safe discharge plan at this time.   Subjective: No significant events overnight, lying comfortably, disoriented, did not offer any complaints.   Physical Exam: General: NAD, lying comfortably,  Appear in no distress, affect confused Eyes: PERRLA ENT: Oral Mucosa Clear, moist  Neck: no JVD,  Cardiovascular: S1 and S2 Present, no Murmur,  Respiratory: good respiratory effort, Bilateral Air entry equal and Decreased, no Crackles, no wheezes Abdomen: Bowel Sound present, Soft and no tenderness,  Skin: no rashes Extremities: no Pedal edema, no calf tenderness Neurologic: without any new focal findings Gait not checked due to patient safety concerns  Vitals:   11/03/23 1631 11/04/23 0500 11/04/23 0621 11/04/23 0756  BP: 117/60 (!) 172/63 (!) (P) 152/68 (!) 145/125  Pulse:    (!) 106  Resp: 16 18  16   Temp: 98 F (36.7 C) 97.6 F (36.4 C)  97.6 F (36.4 C)  TempSrc:      SpO2: 95% 95%  90%  Height:       No intake or output data in the 24 hours ending 11/04/23 1535  Filed Weights    Data Reviewed: I have personally reviewed and interpreted daily labs, tele strips, imagings as discussed above. I reviewed all nursing notes, pharmacy notes, vitals, pertinent old records I have discussed plan of care as described above with RN and patient/family.  CBC: No results for input(s): "WBC", "NEUTROABS", "HGB", "HCT", "MCV", "PLT" in the last 168 hours.  Basic Metabolic Panel: No results for input(s): "NA", "K", "CL", "CO2", "GLUCOSE", "BUN", "CREATININE", "CALCIUM", "MG", "PHOS" in the last 168 hours.   Studies: No results found.  Scheduled Meds:  atenolol  50 mg Oral  Daily   donepezil  10 mg Oral Daily   enoxaparin (LOVENOX) injection  40 mg Subcutaneous Q24H   feeding supplement  237 mL Oral BID BM   FLUoxetine  10 mg Oral Daily   levothyroxine  100 mcg Oral Q0600   lisinopril  40 mg Oral Daily   multivitamin with minerals  1 tablet Oral Daily   polyethylene glycol  17 g Oral BID   Continuous Infusions: PRN Meds: acetaminophen **OR** acetaminophen, bisacodyl, hydrALAZINE, ondansetron **OR** ondansetron (ZOFRAN) IV  Time spent: 25 minutes  Author: Gillis Santa. MD [facility] 11/04/2023 3:35 PM  To reach On-call, see care teams to locate the attending and reach out to them via [email or web address]. If 7PM-7AM, please contact night-coverage If you still have difficulty reaching the attending provider, please page the [facility] (Director on Call) for [facility] on amion for assistance.

## 2023-11-04 NOTE — TOC Progression Note (Signed)
Transition of Care [facility]) - Progression Note    Patient Details  Name: Allison [name] MRN: [id number] Date of Birth: [birth year]  Transition of Care [facility]) CM/SW Contact  Allena Katz, LCSW Phone Number: 11/04/2023, 9:41 AM  Clinical Narrative:   CSW attempted to reach son Kathlene November but unable to reach him and unable to leave a VM.    Expected Discharge Plan:  (TBD) Barriers to Discharge: Continued Medical Work up  Expected Discharge Plan and Services       Living arrangements for the past 2 months: Single Family Home                                       Social Determinants of Health (SDOH) Interventions SDOH Screenings   Food Insecurity: Patient Unable To Answer (09/26/2023)  Housing: High Risk (09/26/2023)  Transportation Needs: Patient Unable To Answer (09/26/2023)  Utilities: Patient Unable To Answer (09/26/2023)  Tobacco Use: Low Risk  (10/12/2023)    Readmission Risk Interventions     No data to display

## 2023-11-05 DIAGNOSIS — N3000 Acute cystitis without hematuria: Secondary | ICD-10-CM | POA: Diagnosis not present

## 2023-11-05 NOTE — Plan of Care (Signed)

## 2023-11-05 NOTE — Progress Notes (Signed)
[facility] Progress Note  Patient: Allison [name]    WGN:[id number]  DOA: 09/25/2023     Date of Service: the patient was seen and examined on 11/05/2023  Chief Complaint  Patient presents with   Failure To Thrive   Brief hospital course: HPI was taken from Dr. Para March: Allison [name] is a 89 y.o. female with medical history significant for Dementia, hypertension, previously at [facility], now on home hospice, sent in by hospice via EMS as and APS case.  Hospice staff reported patient has very little oral intake and has not been taking any medications. Patient has baseline dementia with occasional aggression.  Patient did exhibit aggression in the emergency room, hitting out at staff and was administered Ativan.  Spoke with son on the phone Makeia Richison who stated that for the past 3 weeks patient has been sleeping most of the day and eating very little.  States she came home from [place] back in July and she was able to ambulate independently then.  Currently ambulates very little. ED course and Data review: bradycardia in the 50s but otherwise normal vitals. Labs urinalysis consistent with UTI with positive nitrites moderate leuks and many bacteria.  WBC normal. Potassium 3 sodium 132, total bili 1.8 anion gap 18 with normal bicarb of 24 normal blood glucose 79. Started on Rocephin Hospitalist consulted for admission.    As per Dr. Mayford Knife 10/30-11/5/24: Pt has remained medically stable this week. Still unsafe d/c plan. See CM's notes.    Assessment and Plan:  # UTI: urine cx grew e. coli, pseudomonas. Completed abx course w/ rocephin & cipro.    # Acute metabolic encephalopathy: likely secondary to UTI.    # Dementia: with behavioral disturbance. Avoid benzos. Haldol prn   # Sinus bradycardia: continue on reduced dose of atenolol    # HTN: continue on lisinopril, atenolol. IV hydralazine    # Hypokalemia: will monitor intermittently    # Severe protein  calorie malnutrition: continue on nutritional supplements # Constipation, started stool softeners.  Body mass index is 15.73 kg/m.  Nutrition Problem: Severe Malnutrition Etiology: social / environmental circumstances Interventions:  Pressure Injury 09/26/23 Hip Anterior;Left;Proximal Stage 2 -  Partial thickness loss of dermis presenting as a shallow open injury with a red, pink wound bed without slough. (Active)  09/26/23 0230  Location: Hip  Location Orientation: Anterior;Left;Proximal  Staging: Stage 2 -  Partial thickness loss of dermis presenting as a shallow open injury with a red, pink wound bed without slough.  Wound Description (Comments):   Present on Admission: Yes  Dressing Type Foam - Lift dressing to assess site every shift 11/05/23 1000     Pressure Injury 10/14/23 Thigh Left;Posterior Stage 2 -  Partial thickness loss of dermis presenting as a shallow open injury with a red, pink wound bed without slough. (Active)  10/14/23 1124  Location: Thigh  Location Orientation: Left;Posterior  Staging: Stage 2 -  Partial thickness loss of dermis presenting as a shallow open injury with a red, pink wound bed without slough.  Wound Description (Comments):   Present on Admission:   Dressing Type Foam - Lift dressing to assess site every shift 11/05/23 1000     Pressure Injury 10/14/23 Ankle Left;Lateral Stage 1 -  Intact skin with non-blanchable redness of a localized area usually over a bony prominence. (Active)  10/14/23 1125  Location: Ankle  Location Orientation: Left;Lateral  Staging: Stage 1 -  Intact skin with non-blanchable redness of  a localized area usually over a bony prominence.  Wound Description (Comments):   Present on Admission:   Dressing Type Foam - Lift dressing to assess site every shift 11/05/23 1000     Diet: Dysphagia 2 diet DVT Prophylaxis: Subcutaneous Lovenox   Advance goals of care discussion: DNR  Family Communication: family was not present  at bedside, at the time of interview.  The pt provided permission to discuss medical plan with the family. Opportunity was given to ask question and all questions were answered satisfactorily.   Disposition:  Pt is from home, admitted with UTI, dementia, medically stable to be discharged. TOC is following, no safe discharge plan at this time.   Subjective: No significant events overnight, patient was sleeping comfortably, wearing mittens.  As per RN patient only gets agitated when she needs cleaning.   Physical Exam: General: NAD, lying comfortably,  Appear in no distress, affect confused Eyes: PERRLA ENT: Oral Mucosa Clear, moist  Neck: no JVD,  Cardiovascular: S1 and S2 Present, no Murmur,  Respiratory: good respiratory effort, Bilateral Air entry equal and Decreased, no Crackles, no wheezes Abdomen: Bowel Sound present, Soft and no tenderness,  Skin: no rashes Extremities: no Pedal edema, no calf tenderness Neurologic: without any new focal findings Gait not checked due to patient safety concerns  Vitals:   11/04/23 1659 11/04/23 1958 11/04/23 2245 11/05/23 0821  BP: 123/70 (!) 101/44 118/62 (!) 135/41  Pulse: 82 74  62  Resp: 16 18  18   Temp: (!) 97 F (36.1 C) 98.2 F (36.8 C)  98 F (36.7 C)  TempSrc:      SpO2: 91% 98%  95%  Height:       No intake or output data in the 24 hours ending 11/05/23 1045  Filed Weights    Data Reviewed: I have personally reviewed and interpreted daily labs, tele strips, imagings as discussed above. I reviewed all nursing notes, pharmacy notes, vitals, pertinent old records I have discussed plan of care as described above with RN and patient/family.  CBC: No results for input(s): "WBC", "NEUTROABS", "HGB", "HCT", "MCV", "PLT" in the last 168 hours.  Basic Metabolic Panel: No results for input(s): "NA", "K", "CL", "CO2", "GLUCOSE", "BUN", "CREATININE", "CALCIUM", "MG", "PHOS" in the last 168 hours.   Studies: No results found.   Scheduled Meds:  atenolol  50 mg Oral Daily   donepezil  10 mg Oral Daily   enoxaparin (LOVENOX) injection  40 mg Subcutaneous Q24H   feeding supplement  237 mL Oral BID BM   FLUoxetine  10 mg Oral Daily   levothyroxine  100 mcg Oral Q0600   lisinopril  40 mg Oral Daily   multivitamin with minerals  1 tablet Oral Daily   polyethylene glycol  17 g Oral BID   Continuous Infusions: PRN Meds: acetaminophen **OR** acetaminophen, bisacodyl, hydrALAZINE, ondansetron **OR** ondansetron (ZOFRAN) IV  Time spent: 25 minutes  Author: Gillis Santa. MD [facility] 11/05/2023 10:45 AM  To reach On-call, see care teams to locate the attending and reach out to them via [email or web address]. If 7PM-7AM, please contact night-coverage If you still have difficulty reaching the attending provider, please page the [facility] (Director on Call) for [facility] on amion for assistance.

## 2023-11-06 DIAGNOSIS — N3000 Acute cystitis without hematuria: Secondary | ICD-10-CM | POA: Diagnosis not present

## 2023-11-06 NOTE — TOC Progression Note (Signed)
Transition of Care [facility]) - Progression Note    Patient Details  Name: Allison [name] MRN: [id number] Date of Birth: 06-Jun-1932  [facility] CM/SW Contact  Allena Katz, LCSW Phone Number: 11/06/2023, 2:23 PM  Clinical Narrative:   CSW has attempted to reach patients son numerous times with no call back. CSW has messaged kailee morrow with DSS on what to do.    Expected Discharge Plan:  (TBD) Barriers to Discharge: Continued Medical Work up  Expected Discharge Plan and Services       Living arrangements for the past 2 months: Single Family Home                                       Social Determinants of Health (SDOH) Interventions SDOH Screenings   Food Insecurity: Patient Unable To Answer (09/26/2023)  Housing: High Risk (09/26/2023)  Transportation Needs: Patient Unable To Answer (09/26/2023)  Utilities: Patient Unable To Answer (09/26/2023)  Tobacco Use: Low Risk  (10/12/2023)    Readmission Risk Interventions     No data to display

## 2023-11-06 NOTE — Progress Notes (Signed)
PROGRESS NOTE    Allison KERTIS  [name]:[id number] DOB: 08-26-1932 DOA: 09/25/2023 PCP: Alan Mulder, MD     Brief hospital course: HPI was taken from Dr. Para March: Allison [name] is a 89 y.o. female with medical history significant for Dementia, hypertension, previously at [facility], now on home hospice, sent in by hospice via EMS as and APS case.  Hospice staff had reported the patient had very poor oral intake.  Initial labs consistent with urinary tract infection which grew E. coli and Pseudomonas and completed treatment with Rocephin and Cipro on 10/14.  Patient is currently medically stable, awaiting safe disposition.    Assessment and Plan:   Urinary tract infection secondary to E. coli and Pseudomonas:  Completed treatment with Rocephin and Cipro on 10/14   Acute metabolic encephalopathy:  likely secondary to UTI.    Dementia: with behavioral disturbance.  Avoid benzos. Haldol prn   Sinus bradycardia  continue on reduced dose of atenolol    HTN  continue on lisinopril, atenolol. IV hydralazine    Hypokalemia  will monitor intermittently    Severe protein calorie malnutrition: continue on nutritional supplements Constipation, started stool softeners.  NO FURTHER LABS BEING CHECKED REGULARLY AT THIS TIME    Body mass index is 15.73 kg/m.  Nutrition Problem: Severe Malnutrition Etiology: social / environmental circumstances Interventions:       Pressure Injury 09/26/23 Hip Anterior;Left;Proximal Stage 2 -  Partial thickness loss of dermis presenting as a shallow open injury with a red, pink wound bed without slough. (Active)  09/26/23 0230  Location: Hip  Location Orientation: Anterior;Left;Proximal  Staging: Stage 2 -  Partial thickness loss of dermis presenting as a shallow open injury with a red, pink wound bed without slough.  Wound Description (Comments):   Present on Admission: Yes  Dressing Type Foam - Lift dressing to assess site every shift  11/05/23 1000     Pressure Injury 10/14/23 Thigh Left;Posterior Stage 2 -  Partial thickness loss of dermis presenting as a shallow open injury with a red, pink wound bed without slough. (Active)  10/14/23 1124  Location: Thigh  Location Orientation: Left;Posterior  Staging: Stage 2 -  Partial thickness loss of dermis presenting as a shallow open injury with a red, pink wound bed without slough.  Wound Description (Comments):   Present on Admission:   Dressing Type Foam - Lift dressing to assess site every shift 11/05/23 1000     Pressure Injury 10/14/23 Ankle Left;Lateral Stage 1 -  Intact skin with non-blanchable redness of a localized area usually over a bony prominence. (Active)  10/14/23 1125  Location: Ankle  Location Orientation: Left;Lateral  Staging: Stage 1 -  Intact skin with non-blanchable redness of a localized area usually over a bony prominence.  Wound Description (Comments):   Present on Admission:   Dressing Type Foam - Lift dressing to assess site every shift 11/05/23 1000      Diet: Dysphagia 2 diet DVT Prophylaxis: Subcutaneous Lovenox    Advance goals of care discussion: DNR  Family Communication:    Disposition:  TOC working on safe disposition     Subjective:  Laying in the bed no complaints     Physical Exam: Constitutional: Not in acute distress, appears chronically ill and frail Respiratory: Clear to auscultation bilaterally Cardiovascular: Normal sinus rhythm, no rubs Abdomen: Nontender nondistended good bowel sounds Musculoskeletal: No edema noted Skin: No rashes seen Neurologic: CN 2-12 grossly intact.  And nonfocal Psychiatric: Poor judgment  and insight           Pressure Injury 09/26/23 Hip Anterior;Left;Proximal Stage 2 -  Partial thickness loss of dermis presenting as a shallow open injury with a red, pink wound bed without slough. (Active)  09/26/23 0230  Location: Hip  Location Orientation: Anterior;Left;Proximal  Staging:  Stage 2 -  Partial thickness loss of dermis presenting as a shallow open injury with a red, pink wound bed without slough.  Wound Description (Comments):   Present on Admission: Yes     Pressure Injury 10/14/23 Thigh Left;Posterior Stage 2 -  Partial thickness loss of dermis presenting as a shallow open injury with a red, pink wound bed without slough. (Active)  10/14/23 1124  Location: Thigh  Location Orientation: Left;Posterior  Staging: Stage 2 -  Partial thickness loss of dermis presenting as a shallow open injury with a red, pink wound bed without slough.  Wound Description (Comments):   Present on Admission:      Pressure Injury 10/14/23 Ankle Left;Lateral Stage 1 -  Intact skin with non-blanchable redness of a localized area usually over a bony prominence. (Active)  10/14/23 1125  Location: Ankle  Location Orientation: Left;Lateral  Staging: Stage 1 -  Intact skin with non-blanchable redness of a localized area usually over a bony prominence.  Wound Description (Comments):   Present on Admission:      Diet Orders (From admission, onward)     Start     Ordered   10/21/23 1147  DIET DYS 2 Room service appropriate? No; Fluid consistency: Thin  Diet effective now       Comments: Extra Gravies on meats, potatoes.  Cream Soups.  Question Answer Comment  Room service appropriate? No   Fluid consistency: Thin      10/21/23 1147            Objective: Vitals:   11/05/23 2032 11/06/23 0405 11/06/23 0915 11/06/23 0930  BP: 122/67 (!) 110/55 (!) 144/49 110/60  Pulse: 63 68 (!) 117 68  Resp: 16 17 18    Temp: 98.6 F (37 C) 97.7 F (36.5 C) (!) 97.5 F (36.4 C)   TempSrc: Oral  Oral   SpO2: 99% 100% 100%   Height:        Intake/Output Summary (Last 24 hours) at 11/06/2023 1257 Last data filed at 11/06/2023 1100 Gross per 24 hour  Intake 240 ml  Output --  Net 240 ml   Filed Weights    Scheduled Meds:  atenolol  50 mg Oral Daily   donepezil  10 mg Oral Daily    enoxaparin (LOVENOX) injection  40 mg Subcutaneous Q24H   feeding supplement  237 mL Oral BID BM   FLUoxetine  10 mg Oral Daily   levothyroxine  100 mcg Oral Q0600   lisinopril  40 mg Oral Daily   multivitamin with minerals  1 tablet Oral Daily   polyethylene glycol  17 g Oral BID   Continuous Infusions:  Nutritional status Signs/Symptoms: severe fat depletion, severe muscle depletion, percent weight loss Percent weight loss: 25 %   Body mass index is 15.73 kg/m.  Data Reviewed:   CBC: No results for input(s): "WBC", "NEUTROABS", "HGB", "HCT", "MCV", "PLT" in the last 168 hours. Basic Metabolic Panel: No results for input(s): "NA", "K", "CL", "CO2", "GLUCOSE", "BUN", "CREATININE", "CALCIUM", "MG", "PHOS" in the last 168 hours. GFR: Estimated Creatinine Clearance: 33.3 mL/min (by C-G formula based on SCr of 0.6 mg/dL). Liver Function Tests: No results for input(s): "  AST", "ALT", "ALKPHOS", "BILITOT", "PROT", "ALBUMIN" in the last 168 hours. No results for input(s): "LIPASE", "AMYLASE" in the last 168 hours. No results for input(s): "AMMONIA" in the last 168 hours. Coagulation Profile: No results for input(s): "INR", "PROTIME" in the last 168 hours. Cardiac Enzymes: No results for input(s): "CKTOTAL", "CKMB", "CKMBINDEX", "TROPONINI" in the last 168 hours. BNP (last 3 results) No results for input(s): "PROBNP" in the last 8760 hours. HbA1C: No results for input(s): "HGBA1C" in the last 72 hours. CBG: No results for input(s): "GLUCAP" in the last 168 hours. Lipid Profile: No results for input(s): "CHOL", "HDL", "LDLCALC", "TRIG", "CHOLHDL", "LDLDIRECT" in the last 72 hours. Thyroid Function Tests: No results for input(s): "TSH", "T4TOTAL", "FREET4", "T3FREE", "THYROIDAB" in the last 72 hours. Anemia Panel: No results for input(s): "VITAMINB12", "FOLATE", "FERRITIN", "TIBC", "IRON", "RETICCTPCT" in the last 72 hours. Sepsis Labs: No results for input(s): "PROCALCITON",  "LATICACIDVEN" in the last 168 hours.  No results found for this or any previous visit (from the past 240 hour(s)).       Radiology Studies: No results found.         LOS: 41 days   Time spent= 35 mins    Miguel Rota, MD [facility]  If 7PM-7AM, please contact night-coverage  11/06/2023, 12:57 PM

## 2023-11-06 NOTE — Hospital Course (Addendum)
89 y.o. female with medical history significant for Dementia, hypertension, previously at [facility], now on home hospice, sent in by hospice via EMS as and APS case.  Hospice staff had reported the patient had very poor oral intake.  Initial labs consistent with urinary tract infection which grew E. coli and Pseudomonas and completed treatment with Rocephin and Cipro on 10/14.  Patient is currently medically stable, awaiting safe disposition.  12/11: Vital stable, no acute concern-still pending disposition.  12/12: Vital stable, had a choking spell while eating lunch.  Chest x-ray ordered.  Swallow team switched her to dysphagia 1 diet.  Patient will remain high risk for aspiration due to advanced dementia.  12/13: Vitals and labs stable.  No leukocytosis.  Chest x-ray with left subsegmental atelectasis but no acute abnormality.  Will continue monitoring without any antibiotics.  12/16: Some worsening of agitation overnight, nursing concern of recurrence of UTI so UA was obtained which was positive for nitrite. Prior urine cultures with E. coli and Pseudomonas aeruginosa, giving 1 dose of fosfomycin.

## 2023-11-07 DIAGNOSIS — N3000 Acute cystitis without hematuria: Secondary | ICD-10-CM | POA: Diagnosis not present

## 2023-11-07 NOTE — Plan of Care (Signed)
Patient continues to get agitated when trying to provide incontinent care. She has a new skin tear to left arm, mepilex foam dressing applied to site.   Problem: Clinical Measurements: Goal: Ability to maintain clinical measurements within normal limits will improve Outcome: Progressing Goal: Will remain free from infection Outcome: Progressing Goal: Diagnostic test results will improve Outcome: Progressing Goal: Respiratory complications will improve Outcome: Progressing Goal: Cardiovascular complication will be avoided Outcome: Progressing   Problem: Activity: Goal: Risk for activity intolerance will decrease Outcome: Progressing   Problem: Nutrition: Goal: Adequate nutrition will be maintained Outcome: Progressing   Problem: Coping: Goal: Level of anxiety will decrease Outcome: Progressing   Problem: Elimination: Goal: Will not experience complications related to bowel motility Outcome: Progressing Goal: Will not experience complications related to urinary retention Outcome: Progressing   Problem: Pain Managment: Goal: General experience of comfort will improve Outcome: Progressing   Problem: Safety: Goal: Ability to remain free from injury will improve Outcome: Progressing   Problem: Skin Integrity: Goal: Risk for impaired skin integrity will decrease Outcome: Progressing

## 2023-11-07 NOTE — Plan of Care (Signed)

## 2023-11-07 NOTE — Progress Notes (Signed)
New skin tear to left arm, place mepilex dressing over the site.   11/07/23 1700  Wound / Incision (Open or Dehisced) 11/07/23 Skin tear Arm Left;Posterior;Medial skin tear  Date First Assessed/Time First Assessed: 11/07/23 1700   Wound Type: Skin tear  Location: Arm  Location Orientation: Left;Posterior;Medial  Wound Description (Comments): skin tear  Present on Admission: No  Dressing Type Foam - Lift dressing to assess site every shift  Dressing Changed New  Dressing Status Clean;Intact;Other (Comment) (bleeding)  Drainage Amount Minimal  Treatment Other (Comment) (cleansed and mepilex dressng applied)

## 2023-11-07 NOTE — Progress Notes (Signed)
PROGRESS NOTE    Allison [name]  WUJ:[id number] DOB: 1932/05/27 DOA: 09/25/2023 PCP: Alan Mulder, MD     Brief hospital course: HPI was taken from Dr. Para March: Allison [name] is a 89 y.o. female with medical history significant for Dementia, hypertension, previously at [facility], now on home hospice, sent in by hospice via EMS as and APS case.  Hospice staff had reported the patient had very poor oral intake.  Initial labs consistent with urinary tract infection which grew E. coli and Pseudomonas and completed treatment with Rocephin and Cipro on 10/14.  Patient is currently medically stable, awaiting safe disposition.    Assessment and Plan:   Urinary tract infection secondary to E. coli and Pseudomonas:  Completed treatment with Rocephin and Cipro on 10/14   Acute metabolic encephalopathy:  likely secondary to UTI.    Dementia: with behavioral disturbance.  Avoid benzos. Haldol prn   Sinus bradycardia  continue on reduced dose of atenolol    HTN  continue on lisinopril, atenolol. IV hydralazine    Hypokalemia  will monitor intermittently    Severe protein calorie malnutrition: continue on nutritional supplements Constipation, started stool softeners.  NO FURTHER LABS BEING CHECKED REGULARLY AT THIS TIME    Body mass index is 15.73 kg/m.  Nutrition Problem: Severe Malnutrition Etiology: social / environmental circumstances Interventions:       Pressure Injury 09/26/23 Hip Anterior;Left;Proximal Stage 2 -  Partial thickness loss of dermis presenting as a shallow open injury with a red, pink wound bed without slough. (Active)  09/26/23 0230  Location: Hip  Location Orientation: Anterior;Left;Proximal  Staging: Stage 2 -  Partial thickness loss of dermis presenting as a shallow open injury with a red, pink wound bed without slough.  Wound Description (Comments):   Present on Admission: Yes  Dressing Type Foam - Lift dressing to assess site every shift  11/05/23 1000     Pressure Injury 10/14/23 Thigh Left;Posterior Stage 2 -  Partial thickness loss of dermis presenting as a shallow open injury with a red, pink wound bed without slough. (Active)  10/14/23 1124  Location: Thigh  Location Orientation: Left;Posterior  Staging: Stage 2 -  Partial thickness loss of dermis presenting as a shallow open injury with a red, pink wound bed without slough.  Wound Description (Comments):   Present on Admission:   Dressing Type Foam - Lift dressing to assess site every shift 11/05/23 1000     Pressure Injury 10/14/23 Ankle Left;Lateral Stage 1 -  Intact skin with non-blanchable redness of a localized area usually over a bony prominence. (Active)  10/14/23 1125  Location: Ankle  Location Orientation: Left;Lateral  Staging: Stage 1 -  Intact skin with non-blanchable redness of a localized area usually over a bony prominence.  Wound Description (Comments):   Present on Admission:   Dressing Type Foam - Lift dressing to assess site every shift 11/05/23 1000      Diet: Dysphagia 2 diet DVT Prophylaxis: Subcutaneous Lovenox    Advance goals of care discussion: DNR  Family Communication:    Disposition:  TOC working on safe disposition     Subjective:  No complaints     Physical Exam: Constitutional: Not in acute distress, appears chronically ill and frail Respiratory: Clear to auscultation bilaterally Cardiovascular: Normal sinus rhythm, no rubs Abdomen: Nontender nondistended good bowel sounds Musculoskeletal: No edema noted Skin: No rashes seen Neurologic: CN 2-12 grossly intact.  And nonfocal Psychiatric: Poor judgment and insight  Pressure Injury 09/26/23 Hip Anterior;Left;Proximal Stage 2 -  Partial thickness loss of dermis presenting as a shallow open injury with a red, pink wound bed without slough. (Active)  09/26/23 0230  Location: Hip  Location Orientation: Anterior;Left;Proximal  Staging: Stage 2 -  Partial  thickness loss of dermis presenting as a shallow open injury with a red, pink wound bed without slough.  Wound Description (Comments):   Present on Admission: Yes     Pressure Injury 10/14/23 Thigh Left;Posterior Stage 2 -  Partial thickness loss of dermis presenting as a shallow open injury with a red, pink wound bed without slough. (Active)  10/14/23 1124  Location: Thigh  Location Orientation: Left;Posterior  Staging: Stage 2 -  Partial thickness loss of dermis presenting as a shallow open injury with a red, pink wound bed without slough.  Wound Description (Comments):   Present on Admission:      Pressure Injury 10/14/23 Ankle Left;Lateral Stage 1 -  Intact skin with non-blanchable redness of a localized area usually over a bony prominence. (Active)  10/14/23 1125  Location: Ankle  Location Orientation: Left;Lateral  Staging: Stage 1 -  Intact skin with non-blanchable redness of a localized area usually over a bony prominence.  Wound Description (Comments):   Present on Admission:      Diet Orders (From admission, onward)     Start     Ordered   10/21/23 1147  DIET DYS 2 Room service appropriate? No; Fluid consistency: Thin  Diet effective now       Comments: Extra Gravies on meats, potatoes.  Cream Soups.  Question Answer Comment  Room service appropriate? No   Fluid consistency: Thin      10/21/23 1147            Objective: Vitals:   11/06/23 1616 11/06/23 2010 11/07/23 0453 11/07/23 1135  BP: 125/64 123/74 (!) 106/92 112/86  Pulse: 65 72 68 (!) 108  Resp: 16 18 16 16   Temp: 98.6 F (37 C) 98.1 F (36.7 C) 97.8 F (36.6 C)   TempSrc: Oral Oral Oral   SpO2: 98% 97% 99%   Height:       No intake or output data in the 24 hours ending 11/07/23 1320 Filed Weights    Scheduled Meds:  atenolol  50 mg Oral Daily   donepezil  10 mg Oral Daily   enoxaparin (LOVENOX) injection  40 mg Subcutaneous Q24H   feeding supplement  237 mL Oral BID BM   FLUoxetine  10  mg Oral Daily   levothyroxine  100 mcg Oral Q0600   lisinopril  40 mg Oral Daily   multivitamin with minerals  1 tablet Oral Daily   polyethylene glycol  17 g Oral BID   Continuous Infusions:  Nutritional status Signs/Symptoms: severe fat depletion, severe muscle depletion, percent weight loss Percent weight loss: 25 %   Body mass index is 15.73 kg/m.  Data Reviewed:   CBC: No results for input(s): "WBC", "NEUTROABS", "HGB", "HCT", "MCV", "PLT" in the last 168 hours. Basic Metabolic Panel: No results for input(s): "NA", "K", "CL", "CO2", "GLUCOSE", "BUN", "CREATININE", "CALCIUM", "MG", "PHOS" in the last 168 hours. GFR: Estimated Creatinine Clearance: 33.3 mL/min (by C-G formula based on SCr of 0.6 mg/dL). Liver Function Tests: No results for input(s): "AST", "ALT", "ALKPHOS", "BILITOT", "PROT", "ALBUMIN" in the last 168 hours. No results for input(s): "LIPASE", "AMYLASE" in the last 168 hours. No results for input(s): "AMMONIA" in the last 168 hours. Coagulation Profile:  No results for input(s): "INR", "PROTIME" in the last 168 hours. Cardiac Enzymes: No results for input(s): "CKTOTAL", "CKMB", "CKMBINDEX", "TROPONINI" in the last 168 hours. BNP (last 3 results) No results for input(s): "PROBNP" in the last 8760 hours. HbA1C: No results for input(s): "HGBA1C" in the last 72 hours. CBG: No results for input(s): "GLUCAP" in the last 168 hours. Lipid Profile: No results for input(s): "CHOL", "HDL", "LDLCALC", "TRIG", "CHOLHDL", "LDLDIRECT" in the last 72 hours. Thyroid Function Tests: No results for input(s): "TSH", "T4TOTAL", "FREET4", "T3FREE", "THYROIDAB" in the last 72 hours. Anemia Panel: No results for input(s): "VITAMINB12", "FOLATE", "FERRITIN", "TIBC", "IRON", "RETICCTPCT" in the last 72 hours. Sepsis Labs: No results for input(s): "PROCALCITON", "LATICACIDVEN" in the last 168 hours.  No results found for this or any previous visit (from the past 240 hour(s)).        Radiology Studies: No results found.         LOS: 42 days   Time spent= 35 mins    Miguel Rota, MD [facility]  If 7PM-7AM, please contact night-coverage  11/07/2023, 1:20 PM

## 2023-11-08 DIAGNOSIS — N3000 Acute cystitis without hematuria: Secondary | ICD-10-CM | POA: Diagnosis not present

## 2023-11-08 NOTE — Plan of Care (Signed)
  Problem: Safety: Goal: Ability to remain free from injury will improve Outcome: Progressing   Problem: Skin Integrity: Goal: Risk for impaired skin integrity will decrease Outcome: Progressing   Problem: Elimination: Goal: Will not experience complications related to urinary retention Outcome: Progressing   Problem: Nutrition: Goal: Adequate nutrition will be maintained Outcome: Progressing   Problem: Activity: Goal: Risk for activity intolerance will decrease Outcome: Progressing

## 2023-11-08 NOTE — Plan of Care (Signed)

## 2023-11-08 NOTE — Progress Notes (Signed)
PROGRESS NOTE    Allison [name]  NUU:[id number] DOB: [birth year] DOA: 09/25/2023 PCP: Alan Mulder, MD     Brief hospital course: HPI was taken from Dr. Para March: Allison [name] is a 89 y.o. female with medical history significant for Dementia, hypertension, previously at [facility], now on home hospice, sent in by hospice via EMS as and APS case.  Hospice staff had reported the patient had very poor oral intake.  Initial labs consistent with urinary tract infection which grew E. coli and Pseudomonas and completed treatment with Rocephin and Cipro on 10/14.  Patient is currently medically stable, awaiting safe disposition.    Assessment and Plan:   Urinary tract infection secondary to E. coli and Pseudomonas:  Completed treatment with Rocephin and Cipro on 10/14   Acute metabolic encephalopathy:  likely secondary to UTI.    Dementia: with behavioral disturbance.  Avoid benzos. Haldol prn   Sinus bradycardia  continue on reduced dose of atenolol    HTN  continue on lisinopril, atenolol. IV hydralazine    Hypokalemia  will monitor intermittently    Severe protein calorie malnutrition: continue on nutritional supplements Constipation, started stool softeners.  NO FURTHER LABS BEING CHECKED REGULARLY AT THIS TIME    Body mass index is 15.73 kg/m.  Nutrition Problem: Severe Malnutrition Etiology: social / environmental circumstances Interventions:       Pressure Injury 09/26/23 Hip Anterior;Left;Proximal Stage 2 -  Partial thickness loss of dermis presenting as a shallow open injury with a red, pink wound bed without slough. (Active)  09/26/23 0230  Location: Hip  Location Orientation: Anterior;Left;Proximal  Staging: Stage 2 -  Partial thickness loss of dermis presenting as a shallow open injury with a red, pink wound bed without slough.  Wound Description (Comments):   Present on Admission: Yes  Dressing Type Foam - Lift dressing to assess site every shift  11/05/23 1000     Pressure Injury 10/14/23 Thigh Left;Posterior Stage 2 -  Partial thickness loss of dermis presenting as a shallow open injury with a red, pink wound bed without slough. (Active)  10/14/23 1124  Location: Thigh  Location Orientation: Left;Posterior  Staging: Stage 2 -  Partial thickness loss of dermis presenting as a shallow open injury with a red, pink wound bed without slough.  Wound Description (Comments):   Present on Admission:   Dressing Type Foam - Lift dressing to assess site every shift 11/05/23 1000     Pressure Injury 10/14/23 Ankle Left;Lateral Stage 1 -  Intact skin with non-blanchable redness of a localized area usually over a bony prominence. (Active)  10/14/23 1125  Location: Ankle  Location Orientation: Left;Lateral  Staging: Stage 1 -  Intact skin with non-blanchable redness of a localized area usually over a bony prominence.  Wound Description (Comments):   Present on Admission:   Dressing Type Foam - Lift dressing to assess site every shift 11/05/23 1000      Diet: Dysphagia 2 diet DVT Prophylaxis: Subcutaneous Lovenox    Advance goals of care discussion: DNR  Family Communication:    Disposition:  TOC working on safe disposition     Subjective:  No complaints overnight.      Physical Exam: Constitutional: Not in acute distress, appears chronically ill and frail Respiratory: Clear to auscultation bilaterally Cardiovascular: Normal sinus rhythm, no rubs Abdomen: Nontender nondistended good bowel sounds Musculoskeletal: No edema noted Skin: No rashes seen Neurologic: CN 2-12 grossly intact.  And nonfocal Psychiatric: Poor judgment and insight  Pressure Injury 09/26/23 Hip Anterior;Left;Proximal Stage 2 -  Partial thickness loss of dermis presenting as a shallow open injury with a red, pink wound bed without slough. (Active)  09/26/23 0230  Location: Hip  Location Orientation: Anterior;Left;Proximal  Staging: Stage 2  -  Partial thickness loss of dermis presenting as a shallow open injury with a red, pink wound bed without slough.  Wound Description (Comments):   Present on Admission: Yes     Pressure Injury 10/14/23 Thigh Left;Posterior Stage 2 -  Partial thickness loss of dermis presenting as a shallow open injury with a red, pink wound bed without slough. (Active)  10/14/23 1124  Location: Thigh  Location Orientation: Left;Posterior  Staging: Stage 2 -  Partial thickness loss of dermis presenting as a shallow open injury with a red, pink wound bed without slough.  Wound Description (Comments):   Present on Admission:      Pressure Injury 10/14/23 Ankle Left;Lateral Stage 1 -  Intact skin with non-blanchable redness of a localized area usually over a bony prominence. (Active)  10/14/23 1125  Location: Ankle  Location Orientation: Left;Lateral  Staging: Stage 1 -  Intact skin with non-blanchable redness of a localized area usually over a bony prominence.  Wound Description (Comments):   Present on Admission:      Diet Orders (From admission, onward)     Start     Ordered   10/21/23 1147  DIET DYS 2 Room service appropriate? No; Fluid consistency: Thin  Diet effective now       Comments: Extra Gravies on meats, potatoes.  Cream Soups.  Question Answer Comment  Room service appropriate? No   Fluid consistency: Thin      10/21/23 1147            Objective: Vitals:   11/07/23 1135 11/07/23 1656 11/07/23 2140 11/08/23 0908  BP: 112/86 106/83 111/62 (!) 143/70  Pulse: (!) 108 64 (!) 59 61  Resp: 16 16 18 18   Temp:   98.1 F (36.7 C) 97.8 F (36.6 C)  TempSrc:   Axillary   SpO2:    100%  Height:        Intake/Output Summary (Last 24 hours) at 11/08/2023 1314 Last data filed at 11/08/2023 0945 Gross per 24 hour  Intake 480 ml  Output --  Net 480 ml   Filed Weights    Scheduled Meds:  atenolol  50 mg Oral Daily   donepezil  10 mg Oral Daily   enoxaparin (LOVENOX)  injection  40 mg Subcutaneous Q24H   feeding supplement  237 mL Oral BID BM   FLUoxetine  10 mg Oral Daily   levothyroxine  100 mcg Oral Q0600   lisinopril  40 mg Oral Daily   multivitamin with minerals  1 tablet Oral Daily   polyethylene glycol  17 g Oral BID   Continuous Infusions:  Nutritional status Signs/Symptoms: severe fat depletion, severe muscle depletion, percent weight loss Percent weight loss: 25 %   Body mass index is 15.73 kg/m.  Data Reviewed:   CBC: No results for input(s): "WBC", "NEUTROABS", "HGB", "HCT", "MCV", "PLT" in the last 168 hours. Basic Metabolic Panel: No results for input(s): "NA", "K", "CL", "CO2", "GLUCOSE", "BUN", "CREATININE", "CALCIUM", "MG", "PHOS" in the last 168 hours. GFR: Estimated Creatinine Clearance: 33.3 mL/min (by C-G formula based on SCr of 0.6 mg/dL). Liver Function Tests: No results for input(s): "AST", "ALT", "ALKPHOS", "BILITOT", "PROT", "ALBUMIN" in the last 168 hours. No results for input(s): "LIPASE", "  AMYLASE" in the last 168 hours. No results for input(s): "AMMONIA" in the last 168 hours. Coagulation Profile: No results for input(s): "INR", "PROTIME" in the last 168 hours. Cardiac Enzymes: No results for input(s): "CKTOTAL", "CKMB", "CKMBINDEX", "TROPONINI" in the last 168 hours. BNP (last 3 results) No results for input(s): "PROBNP" in the last 8760 hours. HbA1C: No results for input(s): "HGBA1C" in the last 72 hours. CBG: No results for input(s): "GLUCAP" in the last 168 hours. Lipid Profile: No results for input(s): "CHOL", "HDL", "LDLCALC", "TRIG", "CHOLHDL", "LDLDIRECT" in the last 72 hours. Thyroid Function Tests: No results for input(s): "TSH", "T4TOTAL", "FREET4", "T3FREE", "THYROIDAB" in the last 72 hours. Anemia Panel: No results for input(s): "VITAMINB12", "FOLATE", "FERRITIN", "TIBC", "IRON", "RETICCTPCT" in the last 72 hours. Sepsis Labs: No results for input(s): "PROCALCITON", "LATICACIDVEN" in the last  168 hours.  No results found for this or any previous visit (from the past 240 hour(s)).       Radiology Studies: No results found.         LOS: 43 days   Time spent= 35 mins    Miguel Rota, MD [facility]  If 7PM-7AM, please contact night-coverage  11/08/2023, 1:14 PM

## 2023-11-09 DIAGNOSIS — N3000 Acute cystitis without hematuria: Secondary | ICD-10-CM | POA: Diagnosis not present

## 2023-11-09 NOTE — Progress Notes (Signed)
[facility] Progress Note  Patient: Allison [name]    MVH:[id number]  DOA: 09/25/2023     Date of Service: the patient was seen and examined on 11/09/2023  Chief Complaint  Patient presents with   Failure To Thrive   Brief hospital course: HPI was taken from Dr. Para March: Allison [name] is a 89 y.o. female with medical history significant for Dementia, hypertension, previously at [facility], now on home hospice, sent in by hospice via EMS as and APS case.  Hospice staff had reported the patient had very poor oral intake.  Initial labs consistent with urinary tract infection which grew E. coli and Pseudomonas and completed treatment with Rocephin and Cipro on 10/14.  Patient is currently medically stable, awaiting safe disposition.     Assessment and Plan:   Urinary tract infection secondary to E. coli and Pseudomonas:  Completed treatment with Rocephin and Cipro on 10/14   Acute metabolic encephalopathy:  likely secondary to UTI.    Dementia: with behavioral disturbance.  Avoid benzos. Haldol prn   Sinus bradycardia  continue on reduced dose of atenolol    HTN  continue on lisinopril, atenolol. IV hydralazine    Hypokalemia  will monitor intermittently    Severe protein calorie malnutrition: continue on nutritional supplements Constipation, started stool softeners.   Body mass index is 15.73 kg/m.  Nutrition Problem: Severe Malnutrition Etiology: social / environmental circumstances Interventions:  Pressure Injury 09/26/23 Hip Anterior;Left;Proximal Stage 2 -  Partial thickness loss of dermis presenting as a shallow open injury with a red, pink wound bed without slough. (Active)  09/26/23 0230  Location: Hip  Location Orientation: Anterior;Left;Proximal  Staging: Stage 2 -  Partial thickness loss of dermis presenting as a shallow open injury with a red, pink wound bed without slough.  Wound Description (Comments):   Present on Admission: Yes  Dressing  Type Foam - Lift dressing to assess site every shift 11/08/23 1915     Pressure Injury 10/14/23 Thigh Left;Posterior Stage 2 -  Partial thickness loss of dermis presenting as a shallow open injury with a red, pink wound bed without slough. (Active)  10/14/23 1124  Location: Thigh  Location Orientation: Left;Posterior  Staging: Stage 2 -  Partial thickness loss of dermis presenting as a shallow open injury with a red, pink wound bed without slough.  Wound Description (Comments):   Present on Admission:   Dressing Type Foam - Lift dressing to assess site every shift 11/08/23 1915     Pressure Injury 10/14/23 Ankle Left;Lateral Stage 1 -  Intact skin with non-blanchable redness of a localized area usually over a bony prominence. (Active)  10/14/23 1125  Location: Ankle  Location Orientation: Left;Lateral  Staging: Stage 1 -  Intact skin with non-blanchable redness of a localized area usually over a bony prominence.  Wound Description (Comments):   Present on Admission:   Dressing Type Foam - Lift dressing to assess site every shift 11/08/23 1915     Diet: Dysphagia 2 diet DVT Prophylaxis: Subcutaneous Lovenox   Advance goals of care discussion: DNR  Family Communication: family was notpresent at bedside, at the time of interview.  Patient is AAO x 0   Disposition:  Pt is from home, admitted with UTI, dementia, medically stable to be discharged. TOC is following, no safe discharge plan at this time.  Subjective: No significant events overnight, patient was lying comfortably, denied any complaints.  Physical Exam: General: NAD, lying comfortably Appear in no distress, affect demented  and disoriented Eyes: PERRLA ENT: Oral Mucosa Clear, moist  Neck: no JVD,  Cardiovascular: S1 and S2 Present, no Murmur,  Respiratory: good respiratory effort, Bilateral Air entry equal and Decreased, no Crackles, no wheezes Abdomen: Bowel Sound present, Soft and no tenderness,  Skin: no  rashes Extremities: no Pedal edema, no calf tenderness Neurologic: without any new focal findings Gait not checked due to patient safety concerns  Vitals:   11/07/23 2140 11/08/23 0908 11/08/23 1706 11/08/23 1936  BP: 111/62 (!) 143/70 (!) 156/80 (!) 104/53  Pulse: (!) 59 61 100 65  Resp: 18 18 18 18   Temp: 98.1 F (36.7 C) 97.8 F (36.6 C) 98 F (36.7 C)   TempSrc: Axillary     SpO2:  100% 93% 93%  Height:        Intake/Output Summary (Last 24 hours) at 11/09/2023 1415 Last data filed at 11/08/2023 1700 Gross per 24 hour  Intake 240 ml  Output --  Net 240 ml   [employer]    Data Reviewed: I have personally reviewed and interpreted daily labs, tele strips, imagings as discussed above. I reviewed all nursing notes, pharmacy notes, vitals, pertinent old records I have discussed plan of care as described above with RN and patient/family.  CBC: No results for input(s): "WBC", "NEUTROABS", "HGB", "HCT", "MCV", "PLT" in the last 168 hours. Basic Metabolic Panel: No results for input(s): "NA", "K", "CL", "CO2", "GLUCOSE", "BUN", "CREATININE", "CALCIUM", "MG", "PHOS" in the last 168 hours.  Studies: No results found.  Scheduled Meds:  atenolol  50 mg Oral Daily   donepezil  10 mg Oral Daily   enoxaparin (LOVENOX) injection  40 mg Subcutaneous Q24H   feeding supplement  237 mL Oral BID BM   FLUoxetine  10 mg Oral Daily   levothyroxine  100 mcg Oral Q0600   lisinopril  40 mg Oral Daily   multivitamin with minerals  1 tablet Oral Daily   polyethylene glycol  17 g Oral BID   Continuous Infusions: PRN Meds: acetaminophen **OR** acetaminophen, bisacodyl, hydrALAZINE, ondansetron **OR** ondansetron (ZOFRAN) IV  Time spent: 25 minutes  Author: Gillis Santa. MD [facility] 11/09/2023 2:15 PM  To reach On-call, see care teams to locate the attending and reach out to them via [email or web address]. If 7PM-7AM, please contact night-coverage If you still have difficulty  reaching the attending provider, please page the [facility] (Director on Call) for [facility] on amion for assistance.

## 2023-11-09 NOTE — Plan of Care (Signed)

## 2023-11-10 DIAGNOSIS — N3000 Acute cystitis without hematuria: Secondary | ICD-10-CM | POA: Diagnosis not present

## 2023-11-10 DIAGNOSIS — F0390 Unspecified dementia without behavioral disturbance: Secondary | ICD-10-CM

## 2023-11-10 NOTE — Care Management Important Message (Signed)
Important Message  Patient Details  Name: Allison [name] MRN: [id number] Date of Birth: 10-12-1932   Important Message Given:  Yes - Medicare IM     Khrystian Schauf, Stephan Minister 11/10/2023, 10:30 AM

## 2023-11-10 NOTE — Progress Notes (Signed)
[facility] Progress Note  Patient: Allison [name]    WJX:[id number]  DOA: 09/25/2023     Date of Service: the patient was seen and examined on 11/10/2023  Chief Complaint  Patient presents with   Failure To Thrive   Brief hospital course: HPI was taken from Dr. Para March: KEYRY LENKER is a 89 y.o. female with medical history significant for Dementia, hypertension, previously at [facility], now on home hospice, sent in by hospice via EMS as and APS case.  Hospice staff had reported the patient had very poor oral intake.  Initial labs consistent with urinary tract infection which grew E. coli and Pseudomonas and completed treatment with Rocephin and Cipro on 10/14.  Patient is currently medically stable, awaiting safe disposition.     Assessment and Plan:   Urinary tract infection secondary to E. coli and Pseudomonas:  Completed treatment with Rocephin and Cipro on 10/14   Acute metabolic encephalopathy:  likely secondary to UTI.    Dementia: with behavioral disturbance.  Avoid benzos. Haldol prn   Sinus bradycardia  continue on reduced dose of atenolol    HTN  continue on lisinopril, atenolol. IV hydralazine    Hypokalemia  will monitor intermittently    Severe protein calorie malnutrition: continue on nutritional supplements Constipation, started stool softeners.   Body mass index is 15.73 kg/m.  Nutrition Problem: Severe Malnutrition Etiology: social / environmental circumstances Interventions:  Pressure Injury 09/26/23 Hip Anterior;Left;Proximal Stage 2 -  Partial thickness loss of dermis presenting as a shallow open injury with a red, pink wound bed without slough. (Active)  09/26/23 0230  Location: Hip  Location Orientation: Anterior;Left;Proximal  Staging: Stage 2 -  Partial thickness loss of dermis presenting as a shallow open injury with a red, pink wound bed without slough.  Wound Description (Comments):   Present on Admission: Yes  Dressing  Type Foam - Lift dressing to assess site every shift 11/09/23 2351     Pressure Injury 10/14/23 Thigh Left;Posterior Stage 2 -  Partial thickness loss of dermis presenting as a shallow open injury with a red, pink wound bed without slough. (Active)  10/14/23 1124  Location: Thigh  Location Orientation: Left;Posterior  Staging: Stage 2 -  Partial thickness loss of dermis presenting as a shallow open injury with a red, pink wound bed without slough.  Wound Description (Comments):   Present on Admission:   Dressing Type Foam - Lift dressing to assess site every shift 11/09/23 2351     Pressure Injury 10/14/23 Ankle Left;Lateral Stage 1 -  Intact skin with non-blanchable redness of a localized area usually over a bony prominence. (Active)  10/14/23 1125  Location: Ankle  Location Orientation: Left;Lateral  Staging: Stage 1 -  Intact skin with non-blanchable redness of a localized area usually over a bony prominence.  Wound Description (Comments):   Present on Admission:   Dressing Type Foam - Lift dressing to assess site every shift 11/09/23 0830     Diet: Dysphagia 2 diet DVT Prophylaxis: Subcutaneous Lovenox   Advance goals of care discussion: DNR  Family Communication: family was notpresent at bedside, at the time of interview.  Patient is AAO x 0   Disposition:  Pt is from home, admitted with UTI, dementia, medically stable to be discharged. TOC is following, no safe discharge plan at this time.  Subjective: No significant events overnight, patient was lying comfortably, denied any complaints.  Physical Exam: General: NAD, lying comfortably Appear in no distress, affect demented  and disoriented Eyes: PERRLA ENT: Oral Mucosa Clear, moist  Neck: no JVD,  Cardiovascular: S1 and S2 Present, no Murmur,  Respiratory: good respiratory effort, Bilateral Air entry equal and Decreased, no Crackles, no wheezes Abdomen: Bowel Sound present, Soft and no tenderness,  Skin: no  rashes Extremities: no Pedal edema, no calf tenderness Neurologic: without any new focal findings Gait not checked due to patient safety concerns  Vitals:   11/08/23 1936 11/09/23 1621 11/09/23 2035 11/10/23 0831  BP: (!) 104/53 (!) 152/109 (!) 149/85 138/74  Pulse: 65 71 (!) 58 67  Resp: 18 18 19 18   Temp:  98.6 F (37 C) 98.6 F (37 C) 98 F (36.7 C)  TempSrc:    Oral  SpO2: 93% 99% 96% 100%  Height:        Intake/Output Summary (Last 24 hours) at 11/10/2023 1058 Last data filed at 11/09/2023 1200 Gross per 24 hour  Intake 120 ml  Output --  Net 120 ml   [employer]    Data Reviewed: I have personally reviewed and interpreted daily labs, tele strips, imagings as discussed above. I reviewed all nursing notes, pharmacy notes, vitals, pertinent old records I have discussed plan of care as described above with RN and patient/family.  CBC: No results for input(s): "WBC", "NEUTROABS", "HGB", "HCT", "MCV", "PLT" in the last 168 hours. Basic Metabolic Panel: No results for input(s): "NA", "K", "CL", "CO2", "GLUCOSE", "BUN", "CREATININE", "CALCIUM", "MG", "PHOS" in the last 168 hours.  Studies: No results found.  Scheduled Meds:  atenolol  50 mg Oral Daily   donepezil  10 mg Oral Daily   enoxaparin (LOVENOX) injection  40 mg Subcutaneous Q24H   feeding supplement  237 mL Oral BID BM   FLUoxetine  10 mg Oral Daily   levothyroxine  100 mcg Oral Q0600   lisinopril  40 mg Oral Daily   multivitamin with minerals  1 tablet Oral Daily   polyethylene glycol  17 g Oral BID   Continuous Infusions: PRN Meds: acetaminophen **OR** acetaminophen, bisacodyl, hydrALAZINE, ondansetron **OR** ondansetron (ZOFRAN) IV  Time spent: 25 minutes  Author: Gillis Santa. MD [facility] 11/10/2023 10:58 AM  To reach On-call, see care teams to locate the attending and reach out to them via [email or web address]. If 7PM-7AM, please contact night-coverage If you still have difficulty  reaching the attending provider, please page the [facility] (Director on Call) for [facility] on amion for assistance.

## 2023-11-10 NOTE — TOC Progression Note (Addendum)
Transition of Care [facility]) - Progression Note    Patient Details  Name: Allison [name] MRN: [id number] Date of Birth: 1932/08/29  [facility] CM/SW Contact  Allena Katz, LCSW Phone Number: 11/10/2023, 4:02 PM  Clinical Narrative:   Still no further follow up from DSS on what to do in patients situation. CSW unable to reach son at all. Message again sent to Sharlyne Cai     Expected Discharge Plan:  (TBD) Barriers to Discharge: Continued Medical Work up  Expected Discharge Plan and Services       Living arrangements for the past 2 months: Single Family Home                                       Social Determinants of Health (SDOH) Interventions SDOH Screenings   Food Insecurity: Patient Unable To Answer (09/26/2023)  Housing: High Risk (09/26/2023)  Transportation Needs: Patient Unable To Answer (09/26/2023)  Utilities: Patient Unable To Answer (09/26/2023)  Tobacco Use: Low Risk  (10/12/2023)    Readmission Risk Interventions     No data to display

## 2023-11-11 DIAGNOSIS — F0390 Unspecified dementia without behavioral disturbance: Secondary | ICD-10-CM | POA: Diagnosis not present

## 2023-11-11 NOTE — Plan of Care (Signed)
  Problem: Clinical Measurements: Goal: Ability to maintain clinical measurements within normal limits will improve Outcome: Progressing Goal: Will remain free from infection Outcome: Progressing Goal: Diagnostic test results will improve Outcome: Progressing Goal: Respiratory complications will improve Outcome: Progressing Goal: Cardiovascular complication will be avoided Outcome: Progressing   Problem: Activity: Goal: Risk for activity intolerance will decrease Outcome: Progressing   Problem: Nutrition: Goal: Adequate nutrition will be maintained Outcome: Progressing   Problem: Coping: Goal: Level of anxiety will decrease Outcome: Progressing   Problem: Elimination: Goal: Will not experience complications related to bowel motility Outcome: Progressing Goal: Will not experience complications related to urinary retention Outcome: Progressing   Problem: Pain Managment: Goal: General experience of comfort will improve Outcome: Progressing   Problem: Safety: Goal: Ability to remain free from injury will improve Outcome: Progressing   Problem: Skin Integrity: Goal: Risk for impaired skin integrity will decrease Outcome: Progressing   Problem: Clinical Measurements: Goal: Respiratory complications will improve Outcome: Progressing   Problem: Coping: Goal: Level of anxiety will decrease Outcome: Progressing   Problem: Pain Managment: Goal: General experience of comfort will improve Outcome: Progressing   Problem: Skin Integrity: Goal: Risk for impaired skin integrity will decrease Outcome: Progressing

## 2023-11-11 NOTE — Progress Notes (Signed)
[facility] Progress Note  Patient: Allison [name]    UJW:[id number]  DOA: 09/25/2023     Date of Service: the patient was seen and examined on 11/11/2023  Chief Complaint  Patient presents with   Failure To Thrive   Brief hospital course: HPI was taken from Dr. Para March: Allison [name] is a 89 y.o. female with medical history significant for Dementia, hypertension, previously at [facility], now on home hospice, sent in by hospice via EMS as and APS case.  Hospice staff had reported the patient had very poor oral intake.  Initial labs consistent with urinary tract infection which grew E. coli and Pseudomonas and completed treatment with Rocephin and Cipro on 10/14.  Patient is currently medically stable, awaiting safe disposition.     Assessment and Plan:   Urinary tract infection secondary to E. coli and Pseudomonas:  Completed treatment with Rocephin and Cipro on 10/14   Acute metabolic encephalopathy:  likely secondary to UTI.    Dementia: with behavioral disturbance.  Avoid benzos. Haldol prn   Sinus bradycardia  continue on reduced dose of atenolol    HTN  continue on lisinopril, atenolol. IV hydralazine    Hypokalemia  will monitor intermittently    Severe protein calorie malnutrition: continue on nutritional supplements Constipation, started stool softeners.   Body mass index is 15.73 kg/m.  Nutrition Problem: Severe Malnutrition Etiology: social / environmental circumstances Interventions:  Pressure Injury 09/26/23 Hip Anterior;Left;Proximal Stage 2 -  Partial thickness loss of dermis presenting as a shallow open injury with a red, pink wound bed without slough. (Active)  09/26/23 0230  Location: Hip  Location Orientation: Anterior;Left;Proximal  Staging: Stage 2 -  Partial thickness loss of dermis presenting as a shallow open injury with a red, pink wound bed without slough.  Wound Description (Comments):   Present on Admission: Yes  Dressing  Type Foam - Lift dressing to assess site every shift 11/11/23 1000     Pressure Injury 10/14/23 Thigh Left;Posterior Stage 2 -  Partial thickness loss of dermis presenting as a shallow open injury with a red, pink wound bed without slough. (Active)  10/14/23 1124  Location: Thigh  Location Orientation: Left;Posterior  Staging: Stage 2 -  Partial thickness loss of dermis presenting as a shallow open injury with a red, pink wound bed without slough.  Wound Description (Comments):   Present on Admission:   Dressing Type Foam - Lift dressing to assess site every shift 11/11/23 1000     Pressure Injury 10/14/23 Ankle Left;Lateral Stage 1 -  Intact skin with non-blanchable redness of a localized area usually over a bony prominence. (Active)  10/14/23 1125  Location: Ankle  Location Orientation: Left;Lateral  Staging: Stage 1 -  Intact skin with non-blanchable redness of a localized area usually over a bony prominence.  Wound Description (Comments):   Present on Admission:   Dressing Type Foam - Lift dressing to assess site every shift 11/11/23 1000     Diet: Dysphagia 2 diet DVT Prophylaxis: Subcutaneous Lovenox   Advance goals of care discussion: DNR  Family Communication: family was notpresent at bedside, at the time of interview.  Patient is AAO x 0   Disposition:  Pt is from home, admitted with UTI, dementia, medically stable to be discharged. TOC is following, no safe discharge plan at this time.  Subjective: No significant events overnight, patient was lying comfortably, denied any complaints.  Physical Exam: General: NAD, lying comfortably Appear in no distress, affect demented  and disoriented Eyes: PERRLA ENT: Oral Mucosa Clear, moist  Neck: no JVD,  Cardiovascular: S1 and S2 Present, no Murmur,  Respiratory: good respiratory effort, Bilateral Air entry equal and Decreased, no Crackles, no wheezes Abdomen: Bowel Sound present, Soft and no tenderness,  Skin: no  rashes Extremities: no Pedal edema, no calf tenderness Neurologic: without any new focal findings Gait not checked due to patient safety concerns  Vitals:   11/10/23 0831 11/10/23 1700 11/11/23 0100 11/11/23 0833  BP: 138/74 (!) 113/57 (!) 89/70 132/71  Pulse: 67 63 68 64  Resp: 18 18 18 15   Temp: 98 F (36.7 C) (!) 97.2 F (36.2 C) 98.4 F (36.9 C)   TempSrc: Oral  Oral   SpO2: 100% 96% 95% 100%  Height:       No intake or output data in the 24 hours ending 11/11/23 1154  Filed Weights    Data Reviewed: I have personally reviewed and interpreted daily labs, tele strips, imagings as discussed above. I reviewed all nursing notes, pharmacy notes, vitals, pertinent old records I have discussed plan of care as described above with RN and patient/family.  CBC: No results for input(s): "WBC", "NEUTROABS", "HGB", "HCT", "MCV", "PLT" in the last 168 hours. Basic Metabolic Panel: No results for input(s): "NA", "K", "CL", "CO2", "GLUCOSE", "BUN", "CREATININE", "CALCIUM", "MG", "PHOS" in the last 168 hours.  Studies: No results found.  Scheduled Meds:  atenolol  50 mg Oral Daily   donepezil  10 mg Oral Daily   enoxaparin (LOVENOX) injection  40 mg Subcutaneous Q24H   feeding supplement  237 mL Oral BID BM   FLUoxetine  10 mg Oral Daily   levothyroxine  100 mcg Oral Q0600   lisinopril  40 mg Oral Daily   multivitamin with minerals  1 tablet Oral Daily   polyethylene glycol  17 g Oral BID   Continuous Infusions: PRN Meds: acetaminophen **OR** acetaminophen, bisacodyl, hydrALAZINE, ondansetron **OR** ondansetron (ZOFRAN) IV  Time spent: 25 minutes  Author: Gillis Santa. MD [facility] 11/11/2023 11:54 AM  To reach On-call, see care teams to locate the attending and reach out to them via [email or web address]. If 7PM-7AM, please contact night-coverage If you still have difficulty reaching the attending provider, please page the [facility] (Director on Call) for [facility] on  amion for assistance.

## 2023-11-11 NOTE — Plan of Care (Signed)
  Problem: Clinical Measurements: Goal: Ability to maintain clinical measurements within normal limits will improve Outcome: Progressing Goal: Will remain free from infection Outcome: Progressing Goal: Diagnostic test results will improve Outcome: Progressing Goal: Respiratory complications will improve Outcome: Progressing Goal: Cardiovascular complication will be avoided Outcome: Progressing   Problem: Activity: Goal: Risk for activity intolerance will decrease Outcome: Progressing   Problem: Nutrition: Goal: Adequate nutrition will be maintained Outcome: Progressing   Problem: Coping: Goal: Level of anxiety will decrease Outcome: Progressing   Problem: Elimination: Goal: Will not experience complications related to bowel motility Outcome: Progressing Goal: Will not experience complications related to urinary retention Outcome: Progressing   Problem: Pain Managment: Goal: General experience of comfort will improve Outcome: Progressing   Problem: Safety: Goal: Ability to remain free from injury will improve Outcome: Progressing   Problem: Skin Integrity: Goal: Risk for impaired skin integrity will decrease Outcome: Progressing   Problem: Clinical Measurements: Goal: Will remain free from infection Outcome: Progressing   Problem: Clinical Measurements: Goal: Respiratory complications will improve Outcome: Progressing   Problem: Clinical Measurements: Goal: Cardiovascular complication will be avoided Outcome: Progressing   Problem: Activity: Goal: Risk for activity intolerance will decrease Outcome: Progressing   Problem: Nutrition: Goal: Adequate nutrition will be maintained Outcome: Progressing   Problem: Coping: Goal: Level of anxiety will decrease Outcome: Progressing

## 2023-11-12 DIAGNOSIS — F0390 Unspecified dementia without behavioral disturbance: Secondary | ICD-10-CM | POA: Diagnosis not present

## 2023-11-12 NOTE — Progress Notes (Signed)
Patient refused 1700 vital signs

## 2023-11-12 NOTE — Progress Notes (Signed)
[facility] Progress Note  Patient: Allison [name]    ZOX:[id number]  DOA: 09/25/2023     Date of Service: the patient was seen and examined on 11/12/2023  Chief Complaint  Patient presents with   Failure To Thrive   Brief hospital course: HPI was taken from Dr. Para March: TASHEANA MARCHEL is a 89 y.o. female with medical history significant for Dementia, hypertension, previously at [facility], now on home hospice, sent in by hospice via EMS as and APS case.  Hospice staff had reported the patient had very poor oral intake.  Initial labs consistent with urinary tract infection which grew E. coli and Pseudomonas and completed treatment with Rocephin and Cipro on 10/14.  Patient is currently medically stable, awaiting safe disposition.     Assessment and Plan:   Urinary tract infection secondary to E. coli and Pseudomonas:  Completed treatment with Rocephin and Cipro on 10/14   Acute metabolic encephalopathy:  likely secondary to UTI.    Dementia: with behavioral disturbance.  Avoid benzos. Haldol prn   Sinus bradycardia  continue on reduced dose of atenolol    HTN  continue on lisinopril, atenolol. IV hydralazine    Hypokalemia  will monitor intermittently    Severe protein calorie malnutrition: continue on nutritional supplements Constipation, started stool softeners.   Body mass index is 15.73 kg/m.  Nutrition Problem: Severe Malnutrition Etiology: social / environmental circumstances Interventions:  Pressure Injury 09/26/23 Hip Anterior;Left;Proximal Stage 2 -  Partial thickness loss of dermis presenting as a shallow open injury with a red, pink wound bed without slough. (Active)  09/26/23 0230  Location: Hip  Location Orientation: Anterior;Left;Proximal  Staging: Stage 2 -  Partial thickness loss of dermis presenting as a shallow open injury with a red, pink wound bed without slough.  Wound Description (Comments):   Present on Admission: Yes  Dressing  Type Foam - Lift dressing to assess site every shift 11/12/23 0730     Pressure Injury 10/14/23 Thigh Left;Posterior Stage 2 -  Partial thickness loss of dermis presenting as a shallow open injury with a red, pink wound bed without slough. (Active)  10/14/23 1124  Location: Thigh  Location Orientation: Left;Posterior  Staging: Stage 2 -  Partial thickness loss of dermis presenting as a shallow open injury with a red, pink wound bed without slough.  Wound Description (Comments):   Present on Admission:   Dressing Type Foam - Lift dressing to assess site every shift 11/12/23 0730     Pressure Injury 10/14/23 Ankle Left;Lateral Stage 1 -  Intact skin with non-blanchable redness of a localized area usually over a bony prominence. (Active)  10/14/23 1125  Location: Ankle  Location Orientation: Left;Lateral  Staging: Stage 1 -  Intact skin with non-blanchable redness of a localized area usually over a bony prominence.  Wound Description (Comments):   Present on Admission:   Dressing Type Foam - Lift dressing to assess site every shift 11/12/23 0730     Diet: Dysphagia 2 diet DVT Prophylaxis: Subcutaneous Lovenox   Advance goals of care discussion: DNR  Family Communication: family was notpresent at bedside, at the time of interview.  Patient is AAO x 0   Disposition:  Pt is from home, admitted with UTI, dementia, medically stable to be discharged. TOC is following, no safe discharge plan at this time.  Subjective: No significant events overnight, patient was lying comfortably, denied any complaints.  Physical Exam: General: NAD, lying comfortably Appear in no distress, affect demented  and disoriented Eyes: PERRLA ENT: Oral Mucosa Clear, moist  Neck: no JVD,  Cardiovascular: S1 and S2 Present, no Murmur,  Respiratory: good respiratory effort, Bilateral Air entry equal and Decreased, no Crackles, no wheezes Abdomen: Bowel Sound present, Soft and no tenderness,  Skin: no  rashes Extremities: no Pedal edema, no calf tenderness Neurologic: without any new focal findings Gait not checked due to patient safety concerns  Vitals:   11/11/23 0100 11/11/23 0833 11/11/23 1655 11/12/23 0408  BP: (!) 89/70 132/71 123/63 (!) 117/54  Pulse: 68 64 70 (!) 54  Resp: 18 15 16 18   Temp: 98.4 F (36.9 C)  97.6 F (36.4 C) 97.9 F (36.6 C)  TempSrc: Oral     SpO2: 95% 100%  100%  Height:       No intake or output data in the 24 hours ending 11/12/23 1410  Filed Weights    Data Reviewed: I have personally reviewed and interpreted daily labs, tele strips, imagings as discussed above. I reviewed all nursing notes, pharmacy notes, vitals, pertinent old records I have discussed plan of care as described above with RN and patient/family.  CBC: No results for input(s): "WBC", "NEUTROABS", "HGB", "HCT", "MCV", "PLT" in the last 168 hours. Basic Metabolic Panel: No results for input(s): "NA", "K", "CL", "CO2", "GLUCOSE", "BUN", "CREATININE", "CALCIUM", "MG", "PHOS" in the last 168 hours.  Studies: No results found.  Scheduled Meds:  atenolol  50 mg Oral Daily   donepezil  10 mg Oral Daily   enoxaparin (LOVENOX) injection  40 mg Subcutaneous Q24H   feeding supplement  237 mL Oral BID BM   FLUoxetine  10 mg Oral Daily   levothyroxine  100 mcg Oral Q0600   lisinopril  40 mg Oral Daily   multivitamin with minerals  1 tablet Oral Daily   polyethylene glycol  17 g Oral BID   Continuous Infusions: PRN Meds: acetaminophen **OR** acetaminophen, bisacodyl, hydrALAZINE, ondansetron **OR** ondansetron (ZOFRAN) IV  Time spent: 25 minutes  Author: Gillis Santa. MD [facility] 11/12/2023 2:10 PM  To reach On-call, see care teams to locate the attending and reach out to them via [email or web address]. If 7PM-7AM, please contact night-coverage If you still have difficulty reaching the attending provider, please page the [facility] (Director on Call) for [facility] on amion  for assistance.

## 2023-11-12 NOTE — TOC Progression Note (Signed)
Transition of Care [facility]) - Progression Note    Patient Details  Name: BELLE COSTE MRN: [id number] Date of Birth: [birth year]  Transition of Care [facility] CM/SW Contact  Allena Katz, LCSW Phone Number: 11/12/2023, 10:36 AM  Clinical Narrative:   CSW received call from [employer] who reports they are issuing a protective order for the patient and for them to be able to place patient.     Expected Discharge Plan:  (TBD) Barriers to Discharge: Continued Medical Work up  Expected Discharge Plan and Services       Living arrangements for the past 2 months: Single Family Home                                       Social Determinants of Health (SDOH) Interventions SDOH Screenings   Food Insecurity: Patient Unable To Answer (09/26/2023)  Housing: High Risk (09/26/2023)  Transportation Needs: Patient Unable To Answer (09/26/2023)  Utilities: Patient Unable To Answer (09/26/2023)  Tobacco Use: Low Risk  (10/12/2023)    Readmission Risk Interventions     No data to display

## 2023-11-12 NOTE — Plan of Care (Signed)
  Problem: Clinical Measurements: Goal: Ability to maintain clinical measurements within normal limits will improve Outcome: Completed/Met Goal: Will remain free from infection Outcome: Completed/Met Goal: Diagnostic test results will improve Outcome: Completed/Met Goal: Respiratory complications will improve Outcome: Completed/Met Goal: Cardiovascular complication will be avoided Outcome: Completed/Met   Problem: Activity: Goal: Risk for activity intolerance will decrease Outcome: Completed/Met   Problem: Coping: Goal: Level of anxiety will decrease Outcome: Completed/Met   Problem: Elimination: Goal: Will not experience complications related to bowel motility Outcome: Completed/Met Goal: Will not experience complications related to urinary retention Outcome: Completed/Met   Problem: Pain Managment: Goal: General experience of comfort will improve Outcome: Completed/Met

## 2023-11-12 NOTE — Plan of Care (Signed)
  Problem: Activity: Goal: Risk for activity intolerance will decrease Outcome: not Progressing   Problem: Coping: Goal: Level of anxiety will decrease Outcome: Not Progressing   Problem: Skin Integrity: Goal: Risk for impaired skin integrity will decrease Outcome: Not Progressing

## 2023-11-13 DIAGNOSIS — F0390 Unspecified dementia without behavioral disturbance: Secondary | ICD-10-CM | POA: Diagnosis not present

## 2023-11-13 NOTE — Progress Notes (Signed)
[facility] Progress Note  Patient: Allison [name]    ZDG:[id number]  DOA: 09/25/2023     Date of Service: the patient was seen and examined on 11/13/2023  Chief Complaint  Patient presents with   Failure To Thrive   Brief hospital course: HPI was taken from Dr. Para March: Allison [name] is a 89 y.o. female with medical history significant for Dementia, hypertension, previously at [facility], now on home hospice, sent in by hospice via EMS as and APS case.  Hospice staff had reported the patient had very poor oral intake.  Initial labs consistent with urinary tract infection which grew E. coli and Pseudomonas and completed treatment with Rocephin and Cipro on 10/14.  Patient is currently medically stable, awaiting safe disposition.     Assessment and Plan:   Urinary tract infection secondary to E. coli and Pseudomonas:  Completed treatment with Rocephin and Cipro on 10/14   Acute metabolic encephalopathy:  likely secondary to UTI.    Dementia: with behavioral disturbance.  Avoid benzos. Haldol prn   Sinus bradycardia  continue on reduced dose of atenolol    HTN  continue on lisinopril, atenolol. IV hydralazine    Hypokalemia  will monitor intermittently    Severe protein calorie malnutrition: continue on nutritional supplements Constipation, started stool softeners.   Body mass index is 15.73 kg/m.  Nutrition Problem: Severe Malnutrition Etiology: social / environmental circumstances Interventions:  Pressure Injury 09/26/23 Hip Anterior;Left;Proximal Stage 2 -  Partial thickness loss of dermis presenting as a shallow open injury with a red, pink wound bed without slough. (Active)  09/26/23 0230  Location: Hip  Location Orientation: Anterior;Left;Proximal  Staging: Stage 2 -  Partial thickness loss of dermis presenting as a shallow open injury with a red, pink wound bed without slough.  Wound Description (Comments):   Present on Admission: Yes  Dressing  Type Foam - Lift dressing to assess site every shift 11/12/23 0730     Pressure Injury 10/14/23 Thigh Left;Posterior Stage 2 -  Partial thickness loss of dermis presenting as a shallow open injury with a red, pink wound bed without slough. (Active)  10/14/23 1124  Location: Thigh  Location Orientation: Left;Posterior  Staging: Stage 2 -  Partial thickness loss of dermis presenting as a shallow open injury with a red, pink wound bed without slough.  Wound Description (Comments):   Present on Admission:   Dressing Type Foam - Lift dressing to assess site every shift 11/12/23 0730     Pressure Injury 10/14/23 Ankle Left;Lateral Stage 1 -  Intact skin with non-blanchable redness of a localized area usually over a bony prominence. (Active)  10/14/23 1125  Location: Ankle  Location Orientation: Left;Lateral  Staging: Stage 1 -  Intact skin with non-blanchable redness of a localized area usually over a bony prominence.  Wound Description (Comments):   Present on Admission:   Dressing Type Foam - Lift dressing to assess site every shift 11/12/23 0730     Diet: Dysphagia 2 diet DVT Prophylaxis: Subcutaneous Lovenox   Advance goals of care discussion: DNR  Family Communication: family was notpresent at bedside, at the time of interview.  Patient is AAO x 0   Disposition:  Pt is from home, admitted with UTI, dementia, medically stable to be discharged. TOC is following, no safe discharge plan at this time.  Subjective: No significant events overnight, patient was sleeping comfortably, without any acute distress.   Physical Exam: General: NAD, lying comfortably Appear in no distress,  affect demented and disoriented Eyes: PERRLA ENT: Oral Mucosa Clear, moist  Neck: no JVD,  Cardiovascular: S1 and S2 Present, no Murmur,  Respiratory: good respiratory effort, Bilateral Air entry equal and Decreased, no Crackles, no wheezes Abdomen: Bowel Sound present, Soft and no tenderness,  Skin: no  rashes Extremities: no Pedal edema, no calf tenderness Neurologic: without any new focal findings Gait not checked due to patient safety concerns  Vitals:   11/13/23 0340 11/13/23 0700 11/13/23 1000 11/13/23 1300  BP: (!) 145/52 131/68  (!) 119/53  Pulse: 65 (!) 57 60 63  Resp: 17 18  18   Temp: 98 F (36.7 C) 97.9 F (36.6 C)  98.3 F (36.8 C)  TempSrc: Axillary Axillary  Axillary  SpO2: 98% 98%    Height:        Intake/Output Summary (Last 24 hours) at 11/13/2023 1321 Last data filed at 11/13/2023 1047 Gross per 24 hour  Intake 60 ml  Output --  Net 60 ml    [employer]    Data Reviewed: I have personally reviewed and interpreted daily labs, tele strips, imagings as discussed above. I reviewed all nursing notes, pharmacy notes, vitals, pertinent old records I have discussed plan of care as described above with RN and patient/family.  CBC: No results for input(s): "WBC", "NEUTROABS", "HGB", "HCT", "MCV", "PLT" in the last 168 hours. Basic Metabolic Panel: No results for input(s): "NA", "K", "CL", "CO2", "GLUCOSE", "BUN", "CREATININE", "CALCIUM", "MG", "PHOS" in the last 168 hours.  Studies: No results found.  Scheduled Meds:  atenolol  50 mg Oral Daily   donepezil  10 mg Oral Daily   enoxaparin (LOVENOX) injection  40 mg Subcutaneous Q24H   feeding supplement  237 mL Oral BID BM   FLUoxetine  10 mg Oral Daily   levothyroxine  100 mcg Oral Q0600   lisinopril  40 mg Oral Daily   multivitamin with minerals  1 tablet Oral Daily   polyethylene glycol  17 g Oral BID   Continuous Infusions: PRN Meds: acetaminophen **OR** acetaminophen, bisacodyl, hydrALAZINE, ondansetron **OR** ondansetron (ZOFRAN) IV  Time spent: 25 minutes  Author: Gillis Santa. MD [facility] 11/13/2023 1:21 PM  To reach On-call, see care teams to locate the attending and reach out to them via [email or web address]. If 7PM-7AM, please contact night-coverage If you still have difficulty  reaching the attending provider, please page the [facility] (Director on Call) for [facility] on amion for assistance.

## 2023-11-14 DIAGNOSIS — F0390 Unspecified dementia without behavioral disturbance: Secondary | ICD-10-CM | POA: Diagnosis not present

## 2023-11-14 NOTE — Progress Notes (Signed)
[facility] Progress Note  Patient: Allison [name]    YBO:[id number]  DOA: 09/25/2023     Date of Service: the patient was seen and examined on 11/14/2023  Chief Complaint  Patient presents with   Failure To Thrive   Brief hospital course: HPI was taken from Dr. Para March: [name] is a 89 y.o. female with medical history significant for Dementia, hypertension, previously at [facility], now on home hospice, sent in by hospice via EMS as and APS case.  Hospice staff had reported the patient had very poor oral intake.  Initial labs consistent with urinary tract infection which grew E. coli and Pseudomonas and completed treatment with Rocephin and Cipro on 10/14.  Patient is currently medically stable, awaiting safe disposition.     Assessment and Plan:   Urinary tract infection secondary to E. coli and Pseudomonas:  Completed treatment with Rocephin and Cipro on 10/14   Acute metabolic encephalopathy:  likely secondary to UTI.    Dementia: with behavioral disturbance.  Avoid benzos. Haldol prn   Sinus bradycardia  continue on reduced dose of atenolol    HTN  continue on lisinopril, atenolol. IV hydralazine    Hypokalemia  will monitor intermittently    Severe protein calorie malnutrition: continue on nutritional supplements Constipation, started stool softeners.   Body mass index is 15.73 kg/m.  Nutrition Problem: Severe Malnutrition Etiology: social / environmental circumstances Interventions:  Pressure Injury 09/26/23 Hip Anterior;Left;Proximal Stage 2 -  Partial thickness loss of dermis presenting as a shallow open injury with a red, pink wound bed without slough. (Active)  09/26/23 0230  Location: Hip  Location Orientation: Anterior;Left;Proximal  Staging: Stage 2 -  Partial thickness loss of dermis presenting as a shallow open injury with a red, pink wound bed without slough.  Wound Description (Comments):   Present on Admission: Yes  Dressing  Type Foam - Lift dressing to assess site every shift 11/14/23 0900     Pressure Injury 10/14/23 Thigh Left;Posterior Stage 2 -  Partial thickness loss of dermis presenting as a shallow open injury with a red, pink wound bed without slough. (Active)  10/14/23 1124  Location: Thigh  Location Orientation: Left;Posterior  Staging: Stage 2 -  Partial thickness loss of dermis presenting as a shallow open injury with a red, pink wound bed without slough.  Wound Description (Comments):   Present on Admission:   Dressing Type Foam - Lift dressing to assess site every shift 11/14/23 0900     Pressure Injury 10/14/23 Ankle Left;Lateral Stage 1 -  Intact skin with non-blanchable redness of a localized area usually over a bony prominence. (Active)  10/14/23 1125  Location: Ankle  Location Orientation: Left;Lateral  Staging: Stage 1 -  Intact skin with non-blanchable redness of a localized area usually over a bony prominence.  Wound Description (Comments):   Present on Admission:   Dressing Type Foam - Lift dressing to assess site every shift 11/14/23 0900     Diet: Dysphagia 2 diet DVT Prophylaxis: Subcutaneous Lovenox   Advance goals of care discussion: DNR  Family Communication: family was notpresent at bedside, at the time of interview.  Patient is AAO x 0   Disposition:  Pt is from home, admitted with UTI, dementia, medically stable to be discharged. TOC is following, no safe discharge plan at this time.  Subjective: No significant events overnight, patient was resting comfortably, without any acute distress.  Stated that she is feeling okay, did not offer any complaints.  Physical Exam: General: NAD, lying comfortably Appear in no distress, affect demented and disoriented Eyes: PERRLA ENT: Oral Mucosa Clear, moist  Neck: no JVD,  Cardiovascular: S1 and S2 Present, no Murmur,  Respiratory: good respiratory effort, Bilateral Air entry equal and Decreased, no Crackles, no  wheezes Abdomen: Bowel Sound present, Soft and no tenderness,  Skin: no rashes Extremities: no Pedal edema, no calf tenderness Neurologic: without any new focal findings Gait not checked due to patient safety concerns  Vitals:   11/13/23 1300 11/13/23 1709 11/13/23 2020 11/14/23 0821  BP: (!) 119/53 (!) 157/76 (!) 169/117 127/62  Pulse: 63 66 (!) 56 (!) 50  Resp: 18 16  17   Temp: 98.3 F (36.8 C) 97.9 F (36.6 C)  98.2 F (36.8 C)  TempSrc: Axillary     SpO2:  98% 91% 100%  Height:        Intake/Output Summary (Last 24 hours) at 11/14/2023 1405 Last data filed at 11/13/2023 1923 Gross per 24 hour  Intake 200 ml  Output --  Net 200 ml    [employer] Reviewed: I have personally reviewed and interpreted daily labs, tele strips, imagings as discussed above. I reviewed all nursing notes, pharmacy notes, vitals, pertinent old records I have discussed plan of care as described above with RN and patient/family.  CBC: No results for input(s): "WBC", "NEUTROABS", "HGB", "HCT", "MCV", "PLT" in the last 168 hours. Basic Metabolic Panel: No results for input(s): "NA", "K", "CL", "CO2", "GLUCOSE", "BUN", "CREATININE", "CALCIUM", "MG", "PHOS" in the last 168 hours.  Studies: No results found.  Scheduled Meds:  atenolol  50 mg Oral Daily   donepezil  10 mg Oral Daily   enoxaparin (LOVENOX) injection  40 mg Subcutaneous Q24H   feeding supplement  237 mL Oral BID BM   FLUoxetine  10 mg Oral Daily   levothyroxine  100 mcg Oral Q0600   lisinopril  40 mg Oral Daily   multivitamin with minerals  1 tablet Oral Daily   polyethylene glycol  17 g Oral BID   Continuous Infusions: PRN Meds: acetaminophen **OR** acetaminophen, bisacodyl, hydrALAZINE, ondansetron **OR** ondansetron (ZOFRAN) IV  Time spent: 25 minutes  Author: Gillis Santa. MD [facility] 11/14/2023 2:05 PM  To reach On-call, see care teams to locate the attending and reach out to them via  [email or web address]. If 7PM-7AM, please contact night-coverage If you still have difficulty reaching the attending provider, please page the [facility] (Director on Call) for [facility] on amion for assistance.

## 2023-11-15 DIAGNOSIS — F0390 Unspecified dementia without behavioral disturbance: Secondary | ICD-10-CM | POA: Diagnosis not present

## 2023-11-15 NOTE — Progress Notes (Signed)
[facility] Progress Note  Patient: Allison [name]    GNF:[id number]  DOA: 09/25/2023     Date of Service: the patient was seen and examined on 11/15/2023  Chief Complaint  Patient presents with   Failure To Thrive   Brief hospital course: HPI was taken from Dr. Para March: Allison [name] is a 89 y.o. female with medical history significant for Dementia, hypertension, previously at [facility], now on home hospice, sent in by hospice via EMS as and APS case.  Hospice staff had reported the patient had very poor oral intake.  Initial labs consistent with urinary tract infection which grew E. coli and Pseudomonas and completed treatment with Rocephin and Cipro on 10/14.  Patient is currently medically stable, awaiting safe disposition.     Assessment and Plan:   Urinary tract infection secondary to E. coli and Pseudomonas:  Completed treatment with Rocephin and Cipro on 10/14   Acute metabolic encephalopathy:  likely secondary to UTI.    Dementia: with behavioral disturbance.  Avoid benzos. Haldol prn   Sinus bradycardia  continue on reduced dose of atenolol    HTN  continue on lisinopril, atenolol. IV hydralazine    Hypokalemia  will monitor intermittently    Severe protein calorie malnutrition: continue on nutritional supplements Constipation, started stool softeners.   Body mass index is 15.73 kg/m.  Nutrition Problem: Severe Malnutrition Etiology: social / environmental circumstances Interventions:  Pressure Injury 09/26/23 Hip Anterior;Left;Proximal Stage 2 -  Partial thickness loss of dermis presenting as a shallow open injury with a red, pink wound bed without slough. (Active)  09/26/23 0230  Location: Hip  Location Orientation: Anterior;Left;Proximal  Staging: Stage 2 -  Partial thickness loss of dermis presenting as a shallow open injury with a red, pink wound bed without slough.  Wound Description (Comments):   Present on Admission: Yes  Dressing  Type Foam - Lift dressing to assess site every shift 11/15/23 0930     Pressure Injury 10/14/23 Thigh Left;Posterior Stage 2 -  Partial thickness loss of dermis presenting as a shallow open injury with a red, pink wound bed without slough. (Active)  10/14/23 1124  Location: Thigh  Location Orientation: Left;Posterior  Staging: Stage 2 -  Partial thickness loss of dermis presenting as a shallow open injury with a red, pink wound bed without slough.  Wound Description (Comments):   Present on Admission:   Dressing Type Foam - Lift dressing to assess site every shift 11/15/23 0930     Pressure Injury 10/14/23 Ankle Left;Lateral Stage 1 -  Intact skin with non-blanchable redness of a localized area usually over a bony prominence. (Active)  10/14/23 1125  Location: Ankle  Location Orientation: Left;Lateral  Staging: Stage 1 -  Intact skin with non-blanchable redness of a localized area usually over a bony prominence.  Wound Description (Comments):   Present on Admission:   Dressing Type Foam - Lift dressing to assess site every shift 11/15/23 0930     Diet: Dysphagia 2 diet DVT Prophylaxis: Subcutaneous Lovenox   Advance goals of care discussion: DNR  Family Communication: family was notpresent at bedside, at the time of interview.  Patient is AAO x 0   Disposition:  Pt is from home, admitted with UTI, dementia, medically stable to be discharged. TOC is following, no safe discharge plan at this time.  Subjective: No significant events overnight, patient was laying comfortably in the bed, denied any complaints, feels good.   Physical Exam: General: NAD, lying comfortably  Appear in no distress, affect demented and disoriented Eyes: PERRLA ENT: Oral Mucosa Clear, moist  Neck: no JVD,  Cardiovascular: S1 and S2 Present, no Murmur,  Respiratory: good respiratory effort, Bilateral Air entry equal and Decreased, no Crackles, no wheezes Abdomen: Bowel Sound present, Soft and no  tenderness,  Skin: no rashes Extremities: no Pedal edema, no calf tenderness Neurologic: without any new focal findings Gait not checked due to patient safety concerns  Vitals:   11/14/23 2311 11/15/23 0627 11/15/23 1008 11/15/23 1011  BP: (!) 185/93 (!) 110/95 (!) 142/58   Pulse: 66 65 100   Resp: 16 18 18    Temp:  (!) 97.3 F (36.3 C) 98 F (36.7 C)   TempSrc:      SpO2: 100% 98% 98% 92%  Height:        Intake/Output Summary (Last 24 hours) at 11/15/2023 1142 Last data filed at 11/14/2023 1700 Gross per 24 hour  Intake 150 ml  Output --  Net 150 ml    [employer]    Data Reviewed: I have personally reviewed and interpreted daily labs, tele strips, imagings as discussed above. I reviewed all nursing notes, pharmacy notes, vitals, pertinent old records I have discussed plan of care as described above with RN and patient/family.  CBC: No results for input(s): "WBC", "NEUTROABS", "HGB", "HCT", "MCV", "PLT" in the last 168 hours. Basic Metabolic Panel: No results for input(s): "NA", "K", "CL", "CO2", "GLUCOSE", "BUN", "CREATININE", "CALCIUM", "MG", "PHOS" in the last 168 hours.  Studies: No results found.  Scheduled Meds:  atenolol  50 mg Oral Daily   donepezil  10 mg Oral Daily   enoxaparin (LOVENOX) injection  40 mg Subcutaneous Q24H   feeding supplement  237 mL Oral BID BM   FLUoxetine  10 mg Oral Daily   levothyroxine  100 mcg Oral Q0600   lisinopril  40 mg Oral Daily   multivitamin with minerals  1 tablet Oral Daily   polyethylene glycol  17 g Oral BID   Continuous Infusions: PRN Meds: acetaminophen **OR** acetaminophen, bisacodyl, hydrALAZINE, ondansetron **OR** ondansetron (ZOFRAN) IV  Time spent: 25 minutes  Author: Gillis Santa. MD [facility] 11/15/2023 11:42 AM  To reach On-call, see care teams to locate the attending and reach out to them via [email or web address]. If 7PM-7AM, please contact night-coverage If you still have difficulty reaching  the attending provider, please page the [facility] (Director on Call) for [facility] on amion for assistance.

## 2023-11-16 DIAGNOSIS — F0390 Unspecified dementia without behavioral disturbance: Secondary | ICD-10-CM | POA: Diagnosis not present

## 2023-11-16 NOTE — Progress Notes (Signed)
[facility] Progress Note  Patient: Allison [name]    ZOX:[id number]  DOA: 09/25/2023     Date of Service: the patient was seen and examined on 11/16/2023  Chief Complaint  Patient presents with   Failure To Thrive   Brief hospital course: HPI was taken from Dr. Para March: Allison [name] is a 89 y.o. female with medical history significant for Dementia, hypertension, previously at [facility], now on home hospice, sent in by hospice via EMS as and APS case.  Hospice staff had reported the patient had very poor oral intake.  Initial labs consistent with urinary tract infection which grew E. coli and Pseudomonas and completed treatment with Rocephin and Cipro on 10/14.  Patient is currently medically stable, awaiting safe disposition.     Assessment and Plan:   Urinary tract infection secondary to E. coli and Pseudomonas:  Completed treatment with Rocephin and Cipro on 10/14   Acute metabolic encephalopathy:  likely secondary to UTI.    Dementia: with behavioral disturbance.  Avoid benzos. Haldol prn   Sinus bradycardia  continue on reduced dose of atenolol    HTN  continue on lisinopril, atenolol. IV hydralazine    Hypokalemia  will monitor intermittently    Severe protein calorie malnutrition: continue on nutritional supplements Constipation, started stool softeners.   Body mass index is 16.19 kg/m.  Nutrition Problem: Severe Malnutrition Etiology: social / environmental circumstances Interventions:  Pressure Injury 09/26/23 Hip Anterior;Left;Proximal Stage 2 -  Partial thickness loss of dermis presenting as a shallow open injury with a red, pink wound bed without slough. (Active)  09/26/23 0230  Location: Hip  Location Orientation: Anterior;Left;Proximal  Staging: Stage 2 -  Partial thickness loss of dermis presenting as a shallow open injury with a red, pink wound bed without slough.  Wound Description (Comments):   Present on Admission: Yes  Dressing  Type Foam - Lift dressing to assess site every shift 11/16/23 1102     Pressure Injury 10/14/23 Thigh Left;Posterior Stage 2 -  Partial thickness loss of dermis presenting as a shallow open injury with a red, pink wound bed without slough. (Active)  10/14/23 1124  Location: Thigh  Location Orientation: Left;Posterior  Staging: Stage 2 -  Partial thickness loss of dermis presenting as a shallow open injury with a red, pink wound bed without slough.  Wound Description (Comments):   Present on Admission:   Dressing Type Foam - Lift dressing to assess site every shift 11/15/23 0930     Pressure Injury 10/14/23 Ankle Left;Lateral Stage 1 -  Intact skin with non-blanchable redness of a localized area usually over a bony prominence. (Active)  10/14/23 1125  Location: Ankle  Location Orientation: Left;Lateral  Staging: Stage 1 -  Intact skin with non-blanchable redness of a localized area usually over a bony prominence.  Wound Description (Comments):   Present on Admission:   Dressing Type Foam - Lift dressing to assess site every shift 11/15/23 2000     Diet: Dysphagia 2 diet DVT Prophylaxis: Subcutaneous Lovenox   Advance goals of care discussion: DNR  Family Communication: family was notpresent at bedside, at the time of interview.  Patient is AAO x 0   Disposition:  Pt is from home, admitted with UTI, dementia, medically stable to be discharged. TOC is following, no safe discharge plan at this time.  Subjective: No significant events overnight, patient was awake and said that she is feeling fine, no complaints. She seems to be resting comfortably.  Physical Exam: General: NAD, lying comfortably Appear in no distress, affect demented and disoriented Eyes: PERRLA ENT: Oral Mucosa Clear, moist  Neck: no JVD,  Cardiovascular: S1 and S2 Present, no Murmur,  Respiratory: good respiratory effort, Bilateral Air entry equal and Decreased, no Crackles, no wheezes Abdomen: Bowel Sound  present, Soft and no tenderness,  Skin: no rashes Extremities: no Pedal edema, no calf tenderness Neurologic: without any new focal findings Gait not checked due to patient safety concerns  Vitals:   11/15/23 1700 11/15/23 2028 11/16/23 0542 11/16/23 0810  BP: 124/78 112/62 (!) 146/70   Pulse: 100 79 70   Resp: 18     Temp: 98 F (36.7 C) 98.2 F (36.8 C) 97.7 F (36.5 C)   TempSrc: Oral     SpO2: 93% 95% 99%   Weight:    45.5 kg  Height:        Intake/Output Summary (Last 24 hours) at 11/16/2023 1202 Last data filed at 11/15/2023 1700 Gross per 24 hour  Intake 480 ml  Output --  Net 480 ml    Filed Weights   11/16/23 0810  Weight: 45.5 kg    Data Reviewed: I have personally reviewed and interpreted daily labs, tele strips, imagings as discussed above. I reviewed all nursing notes, pharmacy notes, vitals, pertinent old records I have discussed plan of care as described above with RN and patient/family.  CBC: No results for input(s): "WBC", "NEUTROABS", "HGB", "HCT", "MCV", "PLT" in the last 168 hours. Basic Metabolic Panel: No results for input(s): "NA", "K", "CL", "CO2", "GLUCOSE", "BUN", "CREATININE", "CALCIUM", "MG", "PHOS" in the last 168 hours.  Studies: No results found.  Scheduled Meds:  atenolol  50 mg Oral Daily   donepezil  10 mg Oral Daily   enoxaparin (LOVENOX) injection  40 mg Subcutaneous Q24H   feeding supplement  237 mL Oral BID BM   FLUoxetine  10 mg Oral Daily   levothyroxine  100 mcg Oral Q0600   lisinopril  40 mg Oral Daily   multivitamin with minerals  1 tablet Oral Daily   polyethylene glycol  17 g Oral BID   Continuous Infusions: PRN Meds: acetaminophen **OR** acetaminophen, bisacodyl, hydrALAZINE, ondansetron **OR** ondansetron (ZOFRAN) IV  Time spent: 25 minutes  Author: Gillis Santa. MD [facility] 11/16/2023 12:02 PM  To reach On-call, see care teams to locate the attending and reach out to them via [email or web address]. If  7PM-7AM, please contact night-coverage If you still have difficulty reaching the attending provider, please page the [facility] (Director on Call) for [facility] on amion for assistance.

## 2023-11-16 NOTE — Plan of Care (Signed)
  Problem: Clinical Measurements: Goal: Ability to maintain clinical measurements within normal limits will improve Outcome: Progressing Goal: Respiratory complications will improve Outcome: Progressing Goal: Cardiovascular complication will be avoided Outcome: Progressing   Problem: Coping: Goal: Level of anxiety will decrease Outcome: Progressing   Problem: Elimination: Goal: Will not experience complications related to urinary retention Outcome: Progressing   Problem: Pain Management: Goal: General experience of comfort will improve Outcome: Progressing   Problem: Education: Goal: Knowledge of General Education information will improve Description: Including pain rating scale, medication(s)/side effects and non-pharmacologic comfort measures Outcome: Not Progressing

## 2023-11-16 NOTE — Plan of Care (Signed)
  Problem: Education: Goal: Knowledge of General Education information will improve Description Including pain rating scale, medication(s)/side effects and non-pharmacologic comfort measures Outcome: Progressing   

## 2023-11-17 DIAGNOSIS — F0390 Unspecified dementia without behavioral disturbance: Secondary | ICD-10-CM | POA: Diagnosis not present

## 2023-11-17 NOTE — Progress Notes (Signed)
[facility] Progress Note  Patient: Allison [name]    ZOX:[id number]  DOA: 09/25/2023     Date of Service: the patient was seen and examined on 11/17/2023  Chief Complaint  Patient presents with   Failure To Thrive   Brief hospital course: HPI was taken from Dr. Para March: Allison [name] is a 89 y.o. female with medical history significant for Dementia, hypertension, previously at [facility], now on home hospice, sent in by hospice via EMS as and APS case.  Hospice staff had reported the patient had very poor oral intake.  Initial labs consistent with urinary tract infection which grew E. coli and Pseudomonas and completed treatment with Rocephin and Cipro on 10/14.  Patient is currently medically stable, awaiting safe disposition.     Assessment and Plan:   Urinary tract infection secondary to E. coli and Pseudomonas:  Completed treatment with Rocephin and Cipro on 10/14   Acute metabolic encephalopathy:  likely secondary to UTI.    Dementia: with behavioral disturbance.  Avoid benzos. Haldol prn   Sinus bradycardia  continue on reduced dose of atenolol    HTN  continue on lisinopril, atenolol. IV hydralazine    Hypokalemia  will monitor intermittently    Severe protein calorie malnutrition: continue on nutritional supplements Constipation, started stool softeners.   Body mass index is 16.19 kg/m.  Nutrition Problem: Severe Malnutrition Etiology: social / environmental circumstances Interventions:  Pressure Injury 09/26/23 Hip Anterior;Left;Proximal Stage 2 -  Partial thickness loss of dermis presenting as a shallow open injury with a red, pink wound bed without slough. (Active)  09/26/23 0230  Location: Hip  Location Orientation: Anterior;Left;Proximal  Staging: Stage 2 -  Partial thickness loss of dermis presenting as a shallow open injury with a red, pink wound bed without slough.  Wound Description (Comments):   Present on Admission: Yes  Dressing  Type Foam - Lift dressing to assess site every shift 11/16/23 1102     Pressure Injury 10/14/23 Thigh Left;Posterior Stage 2 -  Partial thickness loss of dermis presenting as a shallow open injury with a red, pink wound bed without slough. (Active)  10/14/23 1124  Location: Thigh  Location Orientation: Left;Posterior  Staging: Stage 2 -  Partial thickness loss of dermis presenting as a shallow open injury with a red, pink wound bed without slough.  Wound Description (Comments):   Present on Admission:   Dressing Type Foam - Lift dressing to assess site every shift 11/15/23 0930     Pressure Injury 10/14/23 Ankle Left;Lateral Stage 1 -  Intact skin with non-blanchable redness of a localized area usually over a bony prominence. (Active)  10/14/23 1125  Location: Ankle  Location Orientation: Left;Lateral  Staging: Stage 1 -  Intact skin with non-blanchable redness of a localized area usually over a bony prominence.  Wound Description (Comments):   Present on Admission:   Dressing Type Foam - Lift dressing to assess site every shift 11/15/23 2000     Diet: Dysphagia 2 diet DVT Prophylaxis: Subcutaneous Lovenox   Advance goals of care discussion: DNR  Family Communication: family was notpresent at bedside, at the time of interview.  Patient is AAO x 0   Disposition:  Pt is from home, admitted with UTI, dementia, medically stable to be discharged. TOC is following, no safe discharge plan at this time.  Subjective: No significant events overnight, sleepy today, resting comfortably, not in acute distress.    Physical Exam: General: NAD, lying comfortably Appear in no  distress, affect demented and disoriented Eyes: PERRLA ENT: Oral Mucosa Clear, moist  Neck: no JVD,  Cardiovascular: S1 and S2 Present, no Murmur,  Respiratory: good respiratory effort, Bilateral Air entry equal and Decreased, no Crackles, no wheezes Abdomen: Bowel Sound present, Soft and no tenderness,  Skin: no  rashes Extremities: no Pedal edema, no calf tenderness Neurologic: without any new focal findings Gait not checked due to patient safety concerns  Vitals:   11/16/23 0542 11/16/23 0810 11/16/23 1645 11/17/23 0500  BP: (!) 146/70  108/77 139/67  Pulse: 70  95 77  Resp:    18  Temp: 97.7 F (36.5 C)   97.8 F (36.6 C)  TempSrc:    Axillary  SpO2: 99%  96% 97%  Weight:  45.5 kg    Height:        Intake/Output Summary (Last 24 hours) at 11/17/2023 1312 Last data filed at 11/17/2023 0900 Gross per 24 hour  Intake 140 ml  Output --  Net 140 ml    Filed Weights   11/16/23 0810  Weight: 45.5 kg    Data Reviewed: I have personally reviewed and interpreted daily labs, tele strips, imagings as discussed above. I reviewed all nursing notes, pharmacy notes, vitals, pertinent old records I have discussed plan of care as described above with RN and patient/family.  CBC: No results for input(s): "WBC", "NEUTROABS", "HGB", "HCT", "MCV", "PLT" in the last 168 hours. Basic Metabolic Panel: No results for input(s): "NA", "K", "CL", "CO2", "GLUCOSE", "BUN", "CREATININE", "CALCIUM", "MG", "PHOS" in the last 168 hours.  Studies: No results found.  Scheduled Meds:  atenolol  50 mg Oral Daily   donepezil  10 mg Oral Daily   enoxaparin (LOVENOX) injection  40 mg Subcutaneous Q24H   feeding supplement  237 mL Oral BID BM   FLUoxetine  10 mg Oral Daily   levothyroxine  100 mcg Oral Q0600   lisinopril  40 mg Oral Daily   multivitamin with minerals  1 tablet Oral Daily   polyethylene glycol  17 g Oral BID   Continuous Infusions: PRN Meds: acetaminophen **OR** acetaminophen, bisacodyl, hydrALAZINE, ondansetron **OR** ondansetron (ZOFRAN) IV  Time spent: 25 minutes  Author: Gillis Santa. MD [facility] 11/17/2023 1:12 PM  To reach On-call, see care teams to locate the attending and reach out to them via [email or web address]. If 7PM-7AM, please contact night-coverage If you still  have difficulty reaching the attending provider, please page the [facility] (Director on Call) for [facility] on amion for assistance.

## 2023-11-17 NOTE — TOC Progression Note (Signed)
Transition of Care [facility]) - Progression Note    Patient Details  Name: Allison [name] MRN: [id number] Date of Birth: Mar 17, 1932  Transition of Care [facility]) CM/SW Contact  Allena Katz, LCSW Phone Number: 11/17/2023, 11:27 AM  Clinical Narrative:   [name] at DSS reports she is waiting for her legal team to get back to her on when the PO is to be submitted.    Expected Discharge Plan:  (TBD) Barriers to Discharge: Continued Medical Work up  Expected Discharge Plan and Services       Living arrangements for the past 2 months: Single Family Home                                       Social Determinants of Health (SDOH) Interventions SDOH Screenings   Food Insecurity: Patient Unable To Answer (09/26/2023)  Housing: High Risk (09/26/2023)  Transportation Needs: Patient Unable To Answer (09/26/2023)  Utilities: Patient Unable To Answer (09/26/2023)  Tobacco Use: Low Risk  (10/12/2023)    Readmission Risk Interventions     No data to display

## 2023-11-18 DIAGNOSIS — F0390 Unspecified dementia without behavioral disturbance: Secondary | ICD-10-CM | POA: Diagnosis not present

## 2023-11-18 NOTE — Progress Notes (Signed)
[facility] Progress Note  Patient: Allison [name]    RUE:[id number]  DOA: 09/25/2023     Date of Service: the patient was seen and examined on 11/18/2023  Chief Complaint  Patient presents with   Failure To Thrive   Brief hospital course: HPI was taken from Dr. Para March: Allison [name] is a 89 y.o. female with medical history significant for Dementia, hypertension, previously at [facility], now on home hospice, sent in by hospice via EMS as and APS case.  Hospice staff had reported the patient had very poor oral intake.  Initial labs consistent with urinary tract infection which grew E. coli and Pseudomonas and completed treatment with Rocephin and Cipro on 10/14.  Patient is currently medically stable, awaiting safe disposition.     Assessment and Plan:   Urinary tract infection secondary to E. coli and Pseudomonas:  Completed treatment with Rocephin and Cipro on 10/14   Acute metabolic encephalopathy:  likely secondary to UTI.    Dementia: with behavioral disturbance.  Avoid benzos. Haldol prn   Sinus bradycardia  continue on reduced dose of atenolol    HTN  continue on lisinopril, atenolol. IV hydralazine    Hypokalemia  will monitor intermittently    Severe protein calorie malnutrition: continue on nutritional supplements Constipation, started stool softeners.   Body mass index is 16.19 kg/m.  Nutrition Problem: Severe Malnutrition Etiology: social / environmental circumstances Interventions:  Pressure Injury 09/26/23 Hip Anterior;Left;Proximal Stage 2 -  Partial thickness loss of dermis presenting as a shallow open injury with a red, pink wound bed without slough. (Active)  09/26/23 0230  Location: Hip  Location Orientation: Anterior;Left;Proximal  Staging: Stage 2 -  Partial thickness loss of dermis presenting as a shallow open injury with a red, pink wound bed without slough.  Wound Description (Comments):   Present on Admission: Yes  Dressing  Type Foam - Lift dressing to assess site every shift 11/18/23 0840     Pressure Injury 10/14/23 Thigh Left;Posterior Stage 2 -  Partial thickness loss of dermis presenting as a shallow open injury with a red, pink wound bed without slough. (Active)  10/14/23 1124  Location: Thigh  Location Orientation: Left;Posterior  Staging: Stage 2 -  Partial thickness loss of dermis presenting as a shallow open injury with a red, pink wound bed without slough.  Wound Description (Comments):   Present on Admission:   Dressing Type Foam - Lift dressing to assess site every shift 11/18/23 0840     Pressure Injury 10/14/23 Ankle Left;Lateral Stage 1 -  Intact skin with non-blanchable redness of a localized area usually over a bony prominence. (Active)  10/14/23 1125  Location: Ankle  Location Orientation: Left;Lateral  Staging: Stage 1 -  Intact skin with non-blanchable redness of a localized area usually over a bony prominence.  Wound Description (Comments):   Present on Admission:   Dressing Type Foam - Lift dressing to assess site every shift 11/18/23 0840     Diet: Dysphagia 2 diet DVT Prophylaxis: Subcutaneous Lovenox   Advance goals of care discussion: DNR  Family Communication: family was notpresent at bedside, at the time of interview.  Patient is AAO x 0   Disposition:  Pt is from home, admitted with UTI, dementia, medically stable to be discharged. TOC is following, no safe discharge plan at this time.  Subjective: No significant events overnight, resting comfortably, denied any complaints.   Physical Exam: General: NAD, lying comfortably Appear in no distress, affect demented and  disoriented Eyes: PERRLA ENT: Oral Mucosa Clear, moist  Neck: no JVD,  Cardiovascular: S1 and S2 Present, no Murmur,  Respiratory: good respiratory effort, Bilateral Air entry equal and Decreased, no Crackles, no wheezes Abdomen: Bowel Sound present, Soft and no tenderness,  Skin: no  rashes Extremities: no Pedal edema, no calf tenderness Neurologic: without any new focal findings Gait not checked due to patient safety concerns  Vitals:   11/17/23 0500 11/17/23 1554 11/17/23 2016 11/18/23 0741  BP: 139/67 (!) 130/102 (!) 139/95 (!) 127/56  Pulse: 77 (!) 58 65 (!) 55  Resp: 18  16 17   Temp: 97.8 F (36.6 C) (!) 97.4 F (36.3 C) 98 F (36.7 C) (!) 97.5 F (36.4 C)  TempSrc: Axillary     SpO2: 97%  99% 100%  Weight:      Height:       No intake or output data in the 24 hours ending 11/18/23 1313   Filed Weights   11/16/23 0810  Weight: 45.5 kg    Data Reviewed: I have personally reviewed and interpreted daily labs, tele strips, imagings as discussed above. I reviewed all nursing notes, pharmacy notes, vitals, pertinent old records I have discussed plan of care as described above with RN and patient/family.  CBC: No results for input(s): "WBC", "NEUTROABS", "HGB", "HCT", "MCV", "PLT" in the last 168 hours. Basic Metabolic Panel: No results for input(s): "NA", "K", "CL", "CO2", "GLUCOSE", "BUN", "CREATININE", "CALCIUM", "MG", "PHOS" in the last 168 hours.  Studies: No results found.  Scheduled Meds:  atenolol  50 mg Oral Daily   donepezil  10 mg Oral Daily   enoxaparin (LOVENOX) injection  40 mg Subcutaneous Q24H   feeding supplement  237 mL Oral BID BM   FLUoxetine  10 mg Oral Daily   levothyroxine  100 mcg Oral Q0600   lisinopril  40 mg Oral Daily   multivitamin with minerals  1 tablet Oral Daily   Continuous Infusions: PRN Meds: acetaminophen **OR** acetaminophen, bisacodyl, hydrALAZINE, ondansetron **OR** ondansetron (ZOFRAN) IV  Time spent: 25 minutes  Author: Gillis Santa. MD [facility] 11/18/2023 1:13 PM  To reach On-call, see care teams to locate the attending and reach out to them via [email or web address]. If 7PM-7AM, please contact night-coverage If you still have difficulty reaching the attending provider, please page the [facility]  (Director on Call) for [facility] on amion for assistance.

## 2023-11-19 DIAGNOSIS — F0390 Unspecified dementia without behavioral disturbance: Secondary | ICD-10-CM | POA: Diagnosis not present

## 2023-11-19 NOTE — Progress Notes (Signed)
[facility] Progress Note  Patient: Allison [name]    ZOX:[id number]  DOA: 09/25/2023     Date of Service: the patient was seen and examined on 11/19/2023  Chief Complaint  Patient presents with   Failure To Thrive   Brief hospital course: HPI was taken from Dr. Para March: Allison [name] is a 89 y.o. female with medical history significant for Dementia, hypertension, previously at [facility], now on home hospice, sent in by hospice via EMS as and APS case.  Hospice staff had reported the patient had very poor oral intake.  Initial labs consistent with urinary tract infection which grew E. coli and Pseudomonas and completed treatment with Rocephin and Cipro on 10/14.  Patient is currently medically stable, awaiting safe disposition.     Assessment and Plan:   Urinary tract infection secondary to E. coli and Pseudomonas:  Completed treatment with Rocephin and Cipro on 10/14   Acute metabolic encephalopathy:  likely secondary to UTI.    Dementia: with behavioral disturbance.  Avoid benzos. Haldol prn   Sinus bradycardia  continue on reduced dose of atenolol    HTN  continue on lisinopril, atenolol. IV hydralazine    Hypokalemia  will monitor intermittently    Severe protein calorie malnutrition: continue on nutritional supplements Constipation, started stool softeners.   Body mass index is 16.19 kg/m.  Nutrition Problem: Severe Malnutrition Etiology: social / environmental circumstances Interventions:  Pressure Injury 09/26/23 Hip Anterior;Left;Proximal Stage 2 -  Partial thickness loss of dermis presenting as a shallow open injury with a red, pink wound bed without slough. (Active)  09/26/23 0230  Location: Hip  Location Orientation: Anterior;Left;Proximal  Staging: Stage 2 -  Partial thickness loss of dermis presenting as a shallow open injury with a red, pink wound bed without slough.  Wound Description (Comments):   Present on Admission: Yes  Dressing  Type Foam - Lift dressing to assess site every shift 11/19/23 0900     Pressure Injury 10/14/23 Thigh Left;Posterior Stage 2 -  Partial thickness loss of dermis presenting as a shallow open injury with a red, pink wound bed without slough. (Active)  10/14/23 1124  Location: Thigh  Location Orientation: Left;Posterior  Staging: Stage 2 -  Partial thickness loss of dermis presenting as a shallow open injury with a red, pink wound bed without slough.  Wound Description (Comments):   Present on Admission:   Dressing Type Foam - Lift dressing to assess site every shift 11/19/23 0900     Pressure Injury 10/14/23 Ankle Left;Lateral Stage 1 -  Intact skin with non-blanchable redness of a localized area usually over a bony prominence. (Active)  10/14/23 1125  Location: Ankle  Location Orientation: Left;Lateral  Staging: Stage 1 -  Intact skin with non-blanchable redness of a localized area usually over a bony prominence.  Wound Description (Comments):   Present on Admission:   Dressing Type Foam - Lift dressing to assess site every shift 11/19/23 0900     Diet: Dysphagia 2 diet DVT Prophylaxis: Subcutaneous Lovenox   Advance goals of care discussion: DNR  Family Communication: family was notpresent at bedside, at the time of interview.  Patient is AAO x 0   Disposition:  Pt is from home, admitted with UTI, dementia, medically stable to be discharged. TOC is following, no safe discharge plan at this time.  Subjective: No significant events overnight, pt was laying comfortably, denied any complaints.   Physical Exam: General: NAD, lying comfortably Appear in no distress, affect  demented and disoriented Eyes: PERRLA ENT: Oral Mucosa Clear, moist  Neck: no JVD,  Cardiovascular: S1 and S2 Present, no Murmur,  Respiratory: good respiratory effort, Bilateral Air entry equal and Decreased, no Crackles, no wheezes Abdomen: Bowel Sound present, Soft and no tenderness,  Skin: no  rashes Extremities: no Pedal edema, no calf tenderness Neurologic: without any new focal findings Gait not checked due to patient safety concerns  Vitals:   11/18/23 1601 11/18/23 2110 11/19/23 0607 11/19/23 0800  BP: 104/66 108/72 105/74 (!) 142/114  Pulse: 63 70 67 86  Resp: 16 18 18    Temp: 98.6 F (37 C) 98.6 F (37 C) 98.4 F (36.9 C) (!) 97.4 F (36.3 C)  TempSrc: Oral Oral Oral Oral  SpO2: 96% 95% 96% 92%  Weight:      Height:       No intake or output data in the 24 hours ending 11/19/23 1349   Filed Weights   11/16/23 0810  Weight: 45.5 kg    Data Reviewed: I have personally reviewed and interpreted daily labs, tele strips, imagings as discussed above. I reviewed all nursing notes, pharmacy notes, vitals, pertinent old records I have discussed plan of care as described above with RN and patient/family.  CBC: No results for input(s): "WBC", "NEUTROABS", "HGB", "HCT", "MCV", "PLT" in the last 168 hours. Basic Metabolic Panel: No results for input(s): "NA", "K", "CL", "CO2", "GLUCOSE", "BUN", "CREATININE", "CALCIUM", "MG", "PHOS" in the last 168 hours.  Studies: No results found.  Scheduled Meds:  atenolol  50 mg Oral Daily   donepezil  10 mg Oral Daily   enoxaparin (LOVENOX) injection  40 mg Subcutaneous Q24H   feeding supplement  237 mL Oral BID BM   FLUoxetine  10 mg Oral Daily   levothyroxine  100 mcg Oral Q0600   lisinopril  40 mg Oral Daily   multivitamin with minerals  1 tablet Oral Daily   Continuous Infusions: PRN Meds: acetaminophen **OR** acetaminophen, bisacodyl, hydrALAZINE, ondansetron **OR** ondansetron (ZOFRAN) IV  Time spent: 25 minutes  Author: Gillis Santa. MD [facility] 11/19/2023 1:49 PM  To reach On-call, see care teams to locate the attending and reach out to them via [email or web address]. If 7PM-7AM, please contact night-coverage If you still have difficulty reaching the attending provider, please page the [facility] (Director on  Call) for [facility] on amion for assistance.

## 2023-11-20 DIAGNOSIS — G9341 Metabolic encephalopathy: Secondary | ICD-10-CM | POA: Diagnosis not present

## 2023-11-20 DIAGNOSIS — E43 Unspecified severe protein-calorie malnutrition: Secondary | ICD-10-CM

## 2023-11-20 DIAGNOSIS — R001 Bradycardia, unspecified: Secondary | ICD-10-CM

## 2023-11-20 DIAGNOSIS — N390 Urinary tract infection, site not specified: Secondary | ICD-10-CM | POA: Diagnosis not present

## 2023-11-20 DIAGNOSIS — F03918 Unspecified dementia, unspecified severity, with other behavioral disturbance: Secondary | ICD-10-CM | POA: Diagnosis not present

## 2023-11-20 DIAGNOSIS — F0390 Unspecified dementia without behavioral disturbance: Secondary | ICD-10-CM | POA: Diagnosis not present

## 2023-11-20 NOTE — Plan of Care (Signed)
  Problem: Education: Goal: Knowledge of General Education information will improve Description: Including pain rating scale, medication(s)/side effects and non-pharmacologic comfort measures Outcome: Not Progressing   Problem: Clinical Measurements: Goal: Ability to maintain clinical measurements within normal limits will improve Outcome: Progressing Goal: Will remain free from infection Outcome: Progressing Goal: Diagnostic test results will improve Outcome: Progressing Goal: Respiratory complications will improve Outcome: Progressing Goal: Cardiovascular complication will be avoided Outcome: Progressing   Problem: Activity: Goal: Risk for activity intolerance will decrease Outcome: Progressing   Problem: Nutrition: Goal: Adequate nutrition will be maintained Outcome: Not Progressing   Problem: Coping: Goal: Level of anxiety will decrease Outcome: Progressing   Problem: Elimination: Goal: Will not experience complications related to bowel motility Outcome: Progressing Goal: Will not experience complications related to urinary retention Outcome: Progressing   Problem: Pain Management: Goal: General experience of comfort will improve Outcome: Progressing   Problem: Safety: Goal: Ability to remain free from injury will improve Outcome: Progressing   Problem: Skin Integrity: Goal: Risk for impaired skin integrity will decrease Outcome: Not Progressing

## 2023-11-20 NOTE — Plan of Care (Signed)
  Problem: Clinical Measurements: Goal: Ability to maintain clinical measurements within normal limits will improve Outcome: Progressing Goal: Will remain free from infection Outcome: Progressing Goal: Diagnostic test results will improve Outcome: Progressing Goal: Respiratory complications will improve Outcome: Progressing Goal: Cardiovascular complication will be avoided Outcome: Progressing   Problem: Activity: Goal: Risk for activity intolerance will decrease Outcome: Progressing   Problem: Coping: Goal: Level of anxiety will decrease Outcome: Progressing   Problem: Elimination: Goal: Will not experience complications related to bowel motility Outcome: Progressing Goal: Will not experience complications related to urinary retention Outcome: Progressing   Problem: Pain Management: Goal: General experience of comfort will improve Outcome: Progressing   Problem: Safety: Goal: Ability to remain free from injury will improve Outcome: Progressing

## 2023-11-20 NOTE — Progress Notes (Signed)
PROGRESS NOTE    SOPHEAP [name]  ZHY:[id number] DOB: 1932/04/14 DOA: 09/25/2023 PCP: Alan Mulder, MD   Brief Narrative:  HPI was taken from Dr. Para March: Allison [name] is a 89 y.o. female with medical history significant for Dementia, hypertension, previously at [facility], now on home hospice, sent in by hospice via EMS as and APS case.  Hospice staff had reported the patient had very poor oral intake.  Initial labs consistent with urinary tract infection which grew E. coli and Pseudomonas and completed treatment with Rocephin and Cipro on 10/14.  Patient is currently medically stable, awaiting safe disposition.  Assessment and Plan:   Urinary tract infection secondary to E. coli and Pseudomonas:  -Completed treatment with Rocephin and Cipro on 10/14 -Afebrile and has no Leukocytosis on last check Recent Labs  Lab 10/27/23 0849  WBC 4.2    Acute metabolic encephalopathy:  -likely secondary to UTI but superimposed on Chronic Dementia -Delirium Precautions   Dementia: with behavioral disturbance.  -Avoid Benzodiazepines and initiate Haldol PRN if necessary -C/w Donepezil 10 mg po Daily   Sinus Bradycardia -Continue on reduced dose of Atenolol 50 mg  -HR's now ranging from 62-86   HTN -Continue on Lisinopril 40 mg po Daily, Atenolol 50 mg po Daily, -C/w IV Hydralazine 10 mg q6hprn SBP >180 or DBP>110  -Continue to Monitor BP per Protocol -Last BP reading was 148/75  Hypokalemia -Patient's K+ Level Trend: Recent Labs  Lab 10/27/23 0849  K 4.4  -Continue to Monitor and Replete as Necessary  Severe protein calorie malnutrition:  Nutrition Status: Nutrition Problem: Severe Malnutrition Etiology: social / environmental circumstances Signs/Symptoms: severe fat depletion, severe muscle depletion, percent weight loss Percent weight loss: 25 %  -C/w Nutritional Supplements  Constipation -C/w Bisacodyl 10 mg po Daily PRN Moderate  Constipation  Underweight Estimated body mass index is 15.69 kg/m as calculated from the following:   Height as of this encounter: 5\' 6"  (1.676 m).   Weight as of this encounter: 44.1 kg.   Pressure Injury, poA Pressure Injury 09/26/23 Hip Anterior;Left;Proximal Stage 2 -  Partial thickness loss of dermis presenting as a shallow open injury with a red, pink wound bed without slough. (Active)  09/26/23 0230  Location: Hip  Location Orientation: Anterior;Left;Proximal  Staging: Stage 2 -  Partial thickness loss of dermis presenting as a shallow open injury with a red, pink wound bed without slough.  Wound Description (Comments):   Present on Admission: Yes     Pressure Injury 10/14/23 Thigh Left;Posterior Stage 2 -  Partial thickness loss of dermis presenting as a shallow open injury with a red, pink wound bed without slough. (Active)  10/14/23 1124  Location: Thigh  Location Orientation: Left;Posterior  Staging: Stage 2 -  Partial thickness loss of dermis presenting as a shallow open injury with a red, pink wound bed without slough.  Wound Description (Comments):   Present on Admission:      Pressure Injury 10/14/23 Ankle Left;Lateral Stage 1 -  Intact skin with non-blanchable redness of a localized area usually over a bony prominence. (Active)  10/14/23 1125  Location: Ankle  Location Orientation: Left;Lateral  Staging: Stage 1 -  Intact skin with non-blanchable redness of a localized area usually over a bony prominence.  Wound Description (Comments):   Present on Admission:    NO FURTHER LABS BEING CHECKED REGULARLY AT THIS TIME    DVT prophylaxis: enoxaparin (LOVENOX) injection 40 mg Start: 09/26/23 1000    Code  Status: Do not attempt resuscitation (DNR) PRE-ARREST INTERVENTIONS DESIRED Family Communication: No family present at bedside  Disposition Plan:  Level of care: Med-Surg Status is: Inpatient Remains inpatient appropriate because: Pt is from home, admitted with  UTI, dementia, medically stable to be discharged. TOC is following, no safe discharge plan at this time.   Consultants:  None  Procedures:  As delineated as above  Antimicrobials:  Anti-infectives (From admission, onward)    Start     Dose/Rate Route Frequency Ordered Stop   10/02/23 2000  ciprofloxacin (CIPRO) tablet 500 mg        500 mg Oral 2 times daily 10/02/23 1258 10/05/23 0759   10/01/23 2000  ciprofloxacin (CIPRO) tablet 500 mg  Status:  Discontinued        500 mg Oral 2 times daily 10/01/23 1119 10/02/23 1258   09/28/23 1315  ciprofloxacin (CIPRO) tablet 500 mg  Status:  Discontinued        500 mg Oral 2 times daily 09/28/23 1224 10/01/23 1119   09/26/23 2000  cefTRIAXone (ROCEPHIN) 1 g in sodium chloride 0.9 % 100 mL IVPB  Status:  Discontinued        1 g 200 mL/hr over 30 Minutes Intravenous Every 24 hours 09/25/23 2228 09/28/23 1225   09/25/23 2130  cefTRIAXone (ROCEPHIN) 1 g in sodium chloride 0.9 % 100 mL IVPB        1 g 200 mL/hr over 30 Minutes Intravenous  Once 09/25/23 2121 09/25/23 2210       Subjective: Seen and examined at bedside and she is being fed by the nurse tech.  No complaints and appears comfortable.  No other concerns at this time.  Objective: Vitals:   11/19/23 1949 11/20/23 0400 11/20/23 0700 11/20/23 0821  BP: 128/80 (!) 148/75  (!) 142/92  Pulse: 73 66  67  Resp: 16 18  17   Temp: 97.8 F (36.6 C) 98 F (36.7 C)  97.7 F (36.5 C)  TempSrc:  Oral    SpO2: 94% 93%  99%  Weight:   44.1 kg   Height:       No intake or output data in the 24 hours ending 11/20/23 1019 Filed Weights   11/16/23 0810 11/20/23 0700  Weight: 45.5 kg 44.1 kg    Examination: Physical Exam:  Constitutional: Thin elderly Caucasian female in no acute distress Respiratory: Diminished to auscultation bilaterally, no wheezing, rales, rhonchi or crackles. Normal respiratory effort and patient is not tachypenic. No accessory muscle use.  Cardiovascular: RRR, no  murmurs / rubs / gallops. S1 and S2 auscultated. No extremity edema.  Abdomen: Soft, non-tender, non-distended. Bowel sounds positive.  GU: Deferred. Musculoskeletal: No clubbing / cyanosis of digits/nails. No joint deformity upper and lower extremities.  Skin: No rashes, lesions, ulcers on a limited skin evaluation. No induration; Warm and dry.  Neurologic: CN 2-12 grossly intact with no focal deficits.  Psychiatric: Pleasantly confused and demented and only awake but not fully oriented  Data Reviewed: I have personally reviewed following labs and imaging studies  CBC: No results for input(s): "WBC", "NEUTROABS", "HGB", "HCT", "MCV", "PLT" in the last 168 hours. Basic Metabolic Panel: No results for input(s): "NA", "K", "CL", "CO2", "GLUCOSE", "BUN", "CREATININE", "CALCIUM", "MG", "PHOS" in the last 168 hours. GFR: CrCl cannot be calculated (Patient's most recent lab result is older than the maximum 21 days allowed.). Liver Function Tests: No results for input(s): "AST", "ALT", "ALKPHOS", "BILITOT", "PROT", "ALBUMIN" in the last 168 hours. No  results for input(s): "LIPASE", "AMYLASE" in the last 168 hours. No results for input(s): "AMMONIA" in the last 168 hours. Coagulation Profile: No results for input(s): "INR", "PROTIME" in the last 168 hours. Cardiac Enzymes: No results for input(s): "CKTOTAL", "CKMB", "CKMBINDEX", "TROPONINI" in the last 168 hours. BNP (last 3 results) No results for input(s): "PROBNP" in the last 8760 hours. HbA1C: No results for input(s): "HGBA1C" in the last 72 hours. CBG: No results for input(s): "GLUCAP" in the last 168 hours. Lipid Profile: No results for input(s): "CHOL", "HDL", "LDLCALC", "TRIG", "CHOLHDL", "LDLDIRECT" in the last 72 hours. Thyroid Function Tests: No results for input(s): "TSH", "T4TOTAL", "FREET4", "T3FREE", "THYROIDAB" in the last 72 hours. Anemia Panel: No results for input(s): "VITAMINB12", "FOLATE", "FERRITIN", "TIBC", "IRON",  "RETICCTPCT" in the last 72 hours. Sepsis Labs: No results for input(s): "PROCALCITON", "LATICACIDVEN" in the last 168 hours.  No results found for this or any previous visit (from the past 240 hour(s)).   Radiology Studies: No results found.  Scheduled Meds:  atenolol  50 mg Oral Daily   donepezil  10 mg Oral Daily   enoxaparin (LOVENOX) injection  40 mg Subcutaneous Q24H   feeding supplement  237 mL Oral BID BM   FLUoxetine  10 mg Oral Daily   levothyroxine  100 mcg Oral Q0600   lisinopril  40 mg Oral Daily   multivitamin with minerals  1 tablet Oral Daily   Continuous Infusions:   LOS: 55 days   Marguerita Merles, DO [facility] Available via Epic secure chat 7am-7pm After these hours, please refer to coverage provider listed on [email or web address] 11/20/2023, 10:19 AM

## 2023-11-21 DIAGNOSIS — F03918 Unspecified dementia, unspecified severity, with other behavioral disturbance: Secondary | ICD-10-CM | POA: Diagnosis not present

## 2023-11-21 DIAGNOSIS — E876 Hypokalemia: Secondary | ICD-10-CM | POA: Diagnosis not present

## 2023-11-21 DIAGNOSIS — G9341 Metabolic encephalopathy: Secondary | ICD-10-CM | POA: Diagnosis not present

## 2023-11-21 DIAGNOSIS — R638 Other symptoms and signs concerning food and fluid intake: Secondary | ICD-10-CM

## 2023-11-21 DIAGNOSIS — F0390 Unspecified dementia without behavioral disturbance: Secondary | ICD-10-CM | POA: Diagnosis not present

## 2023-11-21 NOTE — Plan of Care (Signed)

## 2023-11-21 NOTE — Progress Notes (Signed)
PROGRESS NOTE    Allison [name]  VHQ:[id number] DOB: [birth year] DOA: 09/25/2023 PCP: Alan Mulder, MD   Brief Narrative:  HPI was taken from Dr. Para March: Allison [name] is a 89 y.o. female with medical history significant for Dementia, hypertension, previously at [facility], now on home hospice, sent in by hospice via EMS as and APS case.  Hospice staff had reported the patient had very poor oral intake.  Initial labs consistent with urinary tract infection which grew E. coli and Pseudomonas and completed treatment with Rocephin and Cipro on 10/14.  Patient is currently medically stable, awaiting safe disposition.  Assessment and Plan:   Urinary tract infection secondary to E. coli and Pseudomonas:  -Completed treatment with Rocephin and Cipro on 10/14 -Afebrile and has no Leukocytosis on last check Recent Labs  Lab 10/27/23 0849  WBC 4.2    Acute metabolic encephalopathy:  -likely secondary to UTI but superimposed on Chronic Dementia -Delirium Precautions   Dementia: with behavioral disturbance.  -Avoid Benzodiazepines and initiate Haldol PRN if necessary -C/w Donepezil 10 mg po Daily   Sinus Bradycardia -Continue on reduced dose of Atenolol 50 mg  -HR's now ranging from 62-86   HTN -Continue on Lisinopril 40 mg po Daily, Atenolol 50 mg po Daily, -C/w IV Hydralazine 10 mg q6hprn SBP >180 or DBP>110  -Continue to Monitor BP per Protocol -Last BP reading was 148/75  Hypokalemia -Patient's K+ Level Trend: Recent Labs  Lab 10/27/23 0849  K 4.4  -Continue to Monitor and Replete as Necessary  Severe protein calorie malnutrition:  Nutrition Status: Nutrition Problem: Severe Malnutrition Etiology: social / environmental circumstances Signs/Symptoms: severe fat depletion, severe muscle depletion, percent weight loss Percent weight loss: 25 %  -C/w Nutritional Supplements  Constipation -C/w Bisacodyl 10 mg po Daily PRN Moderate  Constipation  Underweight Estimated body mass index is 15.69 kg/m as calculated from the following:   Height as of this encounter: 5\' 6"  (1.676 m).   Weight as of this encounter: 44.1 kg.   Pressure Injury, poA Pressure Injury 09/26/23 Hip Anterior;Left;Proximal Stage 2 -  Partial thickness loss of dermis presenting as a shallow open injury with a red, pink wound bed without slough. (Active)  09/26/23 0230  Location: Hip  Location Orientation: Anterior;Left;Proximal  Staging: Stage 2 -  Partial thickness loss of dermis presenting as a shallow open injury with a red, pink wound bed without slough.  Wound Description (Comments):   Present on Admission: Yes     Pressure Injury 10/14/23 Thigh Left;Posterior Stage 2 -  Partial thickness loss of dermis presenting as a shallow open injury with a red, pink wound bed without slough. (Active)  10/14/23 1124  Location: Thigh  Location Orientation: Left;Posterior  Staging: Stage 2 -  Partial thickness loss of dermis presenting as a shallow open injury with a red, pink wound bed without slough.  Wound Description (Comments):   Present on Admission:      Pressure Injury 10/14/23 Ankle Left;Lateral Stage 1 -  Intact skin with non-blanchable redness of a localized area usually over a bony prominence. (Active)  10/14/23 1125  Location: Ankle  Location Orientation: Left;Lateral  Staging: Stage 1 -  Intact skin with non-blanchable redness of a localized area usually over a bony prominence.  Wound Description (Comments):   Present on Admission:    NO FURTHER LABS BEING CHECKED REGULARLY AT THIS TIME    DVT prophylaxis: enoxaparin (LOVENOX) injection 40 mg Start: 09/26/23 1000    Code  Status: Do not attempt resuscitation (DNR) PRE-ARREST INTERVENTIONS DESIRED Family Communication: No family currently at bedside  Disposition Plan:  Level of care: Med-Surg Status is: Inpatient Remains inpatient appropriate because: Pt is from home, admitted with  UTI, dementia, medically stable to be discharged. TOC is following, no safe discharge plan at this time.   Consultants:  None  Procedures:  As delineated as above  Antimicrobials:  Anti-infectives (From admission, onward)    Start     Dose/Rate Route Frequency Ordered Stop   10/02/23 2000  ciprofloxacin (CIPRO) tablet 500 mg        500 mg Oral 2 times daily 10/02/23 1258 10/05/23 0759   10/01/23 2000  ciprofloxacin (CIPRO) tablet 500 mg  Status:  Discontinued        500 mg Oral 2 times daily 10/01/23 1119 10/02/23 1258   09/28/23 1315  ciprofloxacin (CIPRO) tablet 500 mg  Status:  Discontinued        500 mg Oral 2 times daily 09/28/23 1224 10/01/23 1119   09/26/23 2000  cefTRIAXone (ROCEPHIN) 1 g in sodium chloride 0.9 % 100 mL IVPB  Status:  Discontinued        1 g 200 mL/hr over 30 Minutes Intravenous Every 24 hours 09/25/23 2228 09/28/23 1225   09/25/23 2130  cefTRIAXone (ROCEPHIN) 1 g in sodium chloride 0.9 % 100 mL IVPB        1 g 200 mL/hr over 30 Minutes Intravenous  Once 09/25/23 2121 09/25/23 2210       Subjective: Seen and examined at bedside and she is pleasantly confused and in denies any complaints or concerns but appears comfortable.  She declines for me to examine her today.  Objective: Vitals:   11/20/23 0821 11/20/23 2109 11/21/23 0545 11/21/23 0757  BP: (!) 142/92 (!) 147/56 (!) 147/77   Pulse: 67 69 66 61  Resp: 17 16 16 18   Temp: 97.7 F (36.5 C) 98.5 F (36.9 C) 97.7 F (36.5 C) (!) 97.5 F (36.4 C)  TempSrc:  Oral Oral   SpO2: 99% 95% 97% 97%  Weight:      Height:        Intake/Output Summary (Last 24 hours) at 11/21/2023 1026 Last data filed at 11/20/2023 2154 Gross per 24 hour  Intake 360 ml  Output --  Net 360 ml   Filed Weights   11/16/23 0810 11/20/23 0700  Weight: 45.5 kg 44.1 kg   Examination: Physical Exam:  Constitutional: Thin elderly Caucasian female no acute distress Respiratory: Visible chest rise on  inspiration Cardiovascular: Would not let me auscultate her heart Abdomen: Would not let me examine or auscultate bowel sounds GU: Deferred. Musculoskeletal: No appreciable joint deformity on the upper extremities Skin: No rashes, lesions, ulcers on limited skin evaluation. Neurologic: CN 2-12 grossly intact with no focal deficits.  Psychiatric: Pleasantly confused and demented  Data Reviewed: I have personally reviewed following labs and imaging studies  CBC: No results for input(s): "WBC", "NEUTROABS", "HGB", "HCT", "MCV", "PLT" in the last 168 hours. Basic Metabolic Panel: No results for input(s): "NA", "K", "CL", "CO2", "GLUCOSE", "BUN", "CREATININE", "CALCIUM", "MG", "PHOS" in the last 168 hours. GFR: CrCl cannot be calculated (Patient's most recent lab result is older than the maximum 21 days allowed.). Liver Function Tests: No results for input(s): "AST", "ALT", "ALKPHOS", "BILITOT", "PROT", "ALBUMIN" in the last 168 hours. No results for input(s): "LIPASE", "AMYLASE" in the last 168 hours. No results for input(s): "AMMONIA" in the last 168 hours.  Coagulation Profile: No results for input(s): "INR", "PROTIME" in the last 168 hours. Cardiac Enzymes: No results for input(s): "CKTOTAL", "CKMB", "CKMBINDEX", "TROPONINI" in the last 168 hours. BNP (last 3 results) No results for input(s): "PROBNP" in the last 8760 hours. HbA1C: No results for input(s): "HGBA1C" in the last 72 hours. CBG: No results for input(s): "GLUCAP" in the last 168 hours. Lipid Profile: No results for input(s): "CHOL", "HDL", "LDLCALC", "TRIG", "CHOLHDL", "LDLDIRECT" in the last 72 hours. Thyroid Function Tests: No results for input(s): "TSH", "T4TOTAL", "FREET4", "T3FREE", "THYROIDAB" in the last 72 hours. Anemia Panel: No results for input(s): "VITAMINB12", "FOLATE", "FERRITIN", "TIBC", "IRON", "RETICCTPCT" in the last 72 hours. Sepsis Labs: No results for input(s): "PROCALCITON", "LATICACIDVEN" in the  last 168 hours.  No results found for this or any previous visit (from the past 240 hour(s)).   Radiology Studies: No results found.  Scheduled Meds:  atenolol  50 mg Oral Daily   donepezil  10 mg Oral Daily   enoxaparin (LOVENOX) injection  40 mg Subcutaneous Q24H   feeding supplement  237 mL Oral BID BM   FLUoxetine  10 mg Oral Daily   levothyroxine  100 mcg Oral Q0600   lisinopril  40 mg Oral Daily   multivitamin with minerals  1 tablet Oral Daily   Continuous Infusions:   LOS: 56 days   Marguerita Merles, DO [facility] Available via Epic secure chat 7am-7pm After these hours, please refer to coverage provider listed on [email or web address] 11/21/2023, 10:26 AM

## 2023-11-22 DIAGNOSIS — F03918 Unspecified dementia, unspecified severity, with other behavioral disturbance: Secondary | ICD-10-CM | POA: Diagnosis not present

## 2023-11-22 DIAGNOSIS — F0390 Unspecified dementia without behavioral disturbance: Secondary | ICD-10-CM | POA: Diagnosis not present

## 2023-11-22 DIAGNOSIS — N3000 Acute cystitis without hematuria: Secondary | ICD-10-CM | POA: Diagnosis not present

## 2023-11-22 DIAGNOSIS — G9341 Metabolic encephalopathy: Secondary | ICD-10-CM | POA: Diagnosis not present

## 2023-11-22 NOTE — Progress Notes (Signed)
PROGRESS NOTE    Allison [name]  Allison [name]:[id number] DOB: [birth year] DOA: 09/25/2023 PCP: Alan Mulder, MD   Brief Narrative:  HPI was taken from Dr. Para March: Allison [name] is a 89 y.o. female with medical history significant for Dementia, hypertension, previously at [facility], now on home hospice, sent in by hospice via EMS as and APS case.  Hospice staff had reported the patient had very poor oral intake.  Initial labs consistent with urinary tract infection which grew E. coli and Pseudomonas and completed treatment with Rocephin and Cipro on 10/14.  Patient is currently medically stable, awaiting safe disposition.  Assessment and Plan:   Urinary tract infection secondary to E. coli and Pseudomonas:  -Completed treatment with Rocephin and Cipro on 10/14 -Afebrile and has no Leukocytosis on last check Recent Labs  Lab 10/27/23 0849  WBC 4.2    Acute metabolic encephalopathy:  -likely secondary to UTI but superimposed on Chronic Dementia -Delirium Precautions   Dementia: with behavioral disturbance.  -Avoid Benzodiazepines and initiate Haldol PRN if necessary -C/w Donepezil 10 mg po Daily   Sinus Bradycardia -Continue on reduced dose of Atenolol 50 mg  -HR's now ranging from 62-86   HTN -Continue on Lisinopril 40 mg po Daily, Atenolol 50 mg po Daily, -C/w IV Hydralazine 10 mg q6hprn SBP >180 or DBP>110  -Continue to Monitor BP per Protocol -Last BP reading was 148/75  Hypokalemia -Patient's K+ Level Trend: Recent Labs  Lab 10/27/23 0849  K 4.4  -Continue to Monitor and Replete as Necessary  Severe protein calorie malnutrition:  Nutrition Status: Nutrition Problem: Severe Malnutrition Etiology: social / environmental circumstances Signs/Symptoms: severe fat depletion, severe muscle depletion, percent weight loss Percent weight loss: 25 %  -C/w Nutritional Supplements  Constipation -C/w Bisacodyl 10 mg po Daily PRN Moderate  Constipation  Underweight Estimated body mass index is 15.69 kg/m as calculated from the following:   Height as of this encounter: 5\' 6"  (1.676 m).   Weight as of this encounter: 44.1 kg.   Pressure Injury, poA Pressure Injury 09/26/23 Hip Anterior;Left;Proximal Stage 2 -  Partial thickness loss of dermis presenting as a shallow open injury with a red, pink wound bed without slough. (Active)  09/26/23 0230  Location: Hip  Location Orientation: Anterior;Left;Proximal  Staging: Stage 2 -  Partial thickness loss of dermis presenting as a shallow open injury with a red, pink wound bed without slough.  Wound Description (Comments):   Present on Admission: Yes     Pressure Injury 10/14/23 Thigh Left;Posterior Stage 2 -  Partial thickness loss of dermis presenting as a shallow open injury with a red, pink wound bed without slough. (Active)  10/14/23 1124  Location: Thigh  Location Orientation: Left;Posterior  Staging: Stage 2 -  Partial thickness loss of dermis presenting as a shallow open injury with a red, pink wound bed without slough.  Wound Description (Comments):   Present on Admission:      Pressure Injury 10/14/23 Ankle Left;Lateral Stage 1 -  Intact skin with non-blanchable redness of a localized area usually over a bony prominence. (Active)  10/14/23 1125  Location: Ankle  Location Orientation: Left;Lateral  Staging: Stage 1 -  Intact skin with non-blanchable redness of a localized area usually over a bony prominence.  Wound Description (Comments):   Present on Admission:    NO FURTHER LABS BEING CHECKED REGULARLY AT THIS TIME    DVT prophylaxis: enoxaparin (LOVENOX) injection 40 mg Start: 09/26/23 1000    Code  Status: Do not attempt resuscitation (DNR) PRE-ARREST INTERVENTIONS DESIRED Family Communication: No family present at bedside  Disposition Plan:  Level of care: Med-Surg Status is: Inpatient Remains inpatient appropriate because: Pt is from home, admitted with  UTI, dementia, medically stable to be discharged. TOC is following, no safe discharge plan at this time.   Consultants:  None  Procedures:  As delineated as above  Antimicrobials:  Anti-infectives (From admission, onward)    Start     Dose/Rate Route Frequency Ordered Stop   10/02/23 2000  ciprofloxacin (CIPRO) tablet 500 mg        500 mg Oral 2 times daily 10/02/23 1258 10/05/23 0759   10/01/23 2000  ciprofloxacin (CIPRO) tablet 500 mg  Status:  Discontinued        500 mg Oral 2 times daily 10/01/23 1119 10/02/23 1258   09/28/23 1315  ciprofloxacin (CIPRO) tablet 500 mg  Status:  Discontinued        500 mg Oral 2 times daily 09/28/23 1224 10/01/23 1119   09/26/23 2000  cefTRIAXone (ROCEPHIN) 1 g in sodium chloride 0.9 % 100 mL IVPB  Status:  Discontinued        1 g 200 mL/hr over 30 Minutes Intravenous Every 24 hours 09/25/23 2228 09/28/23 1225   09/25/23 2130  cefTRIAXone (ROCEPHIN) 1 g in sodium chloride 0.9 % 100 mL IVPB        1 g 200 mL/hr over 30 Minutes Intravenous  Once 09/25/23 2121 09/25/23 2210       Subjective: Seen and examined at bedside and she is resting comfortably not agitated and want me examine her.  No concerns overnight.  Feels okay.  Objective: Vitals:   11/21/23 0545 11/21/23 0757 11/21/23 1556 11/22/23 0618  BP: (!) 147/77  (!) 128/58 (!) 147/67  Pulse: 66 61 72 (!) 54  Resp: 16 18 16    Temp: 97.7 F (36.5 C) (!) 97.5 F (36.4 C) 97.8 F (36.6 C)   TempSrc: Oral  Oral   SpO2: 97% 97% 100% 96%  Weight:      Height:       No intake or output data in the 24 hours ending 11/22/23 0840 Filed Weights   11/16/23 0810 11/20/23 0700  Weight: 45.5 kg 44.1 kg    Examination: Physical Exam:  Constitutional: Thin elderly Caucasian female who is pleasantly demented in no acute distress Respiratory: Slightly diminished to auscultation bilaterally, no wheezing, rales, rhonchi or crackles. Normal respiratory effort and patient is not tachypenic. No  accessory muscle use.  Unlabored breathing Cardiovascular: RRR.  No lower extremity edema Abdomen: Soft, non-tender, non-distended. Bowel sounds positive.  GU: Deferred. Musculoskeletal: No clubbing / cyanosis of digits/nails. No joint deformity upper and lower extremities.  Skin: No rashes, lesions, ulcers limited skin evaluation. No induration; Warm and dry.  Neurologic: CN 2-12 grossly intact with no focal deficits. Romberg sign and cerebellar reflexes not assessed.  Psychiatric: Impaired judgment and insight remains confused and pleasantly demented  Data Reviewed: I have personally reviewed following labs and imaging studies  CBC: No results for input(s): "WBC", "NEUTROABS", "HGB", "HCT", "MCV", "PLT" in the last 168 hours. Basic Metabolic Panel: No results for input(s): "NA", "K", "CL", "CO2", "GLUCOSE", "BUN", "CREATININE", "CALCIUM", "MG", "PHOS" in the last 168 hours. GFR: CrCl cannot be calculated (Patient's most recent lab result is older than the maximum 21 days allowed.). Liver Function Tests: No results for input(s): "AST", "ALT", "ALKPHOS", "BILITOT", "PROT", "ALBUMIN" in the last 168 hours. No  results for input(s): "LIPASE", "AMYLASE" in the last 168 hours. No results for input(s): "AMMONIA" in the last 168 hours. Coagulation Profile: No results for input(s): "INR", "PROTIME" in the last 168 hours. Cardiac Enzymes: No results for input(s): "CKTOTAL", "CKMB", "CKMBINDEX", "TROPONINI" in the last 168 hours. BNP (last 3 results) No results for input(s): "PROBNP" in the last 8760 hours. HbA1C: No results for input(s): "HGBA1C" in the last 72 hours. CBG: No results for input(s): "GLUCAP" in the last 168 hours. Lipid Profile: No results for input(s): "CHOL", "HDL", "LDLCALC", "TRIG", "CHOLHDL", "LDLDIRECT" in the last 72 hours. Thyroid Function Tests: No results for input(s): "TSH", "T4TOTAL", "FREET4", "T3FREE", "THYROIDAB" in the last 72 hours. Anemia Panel: No results  for input(s): "VITAMINB12", "FOLATE", "FERRITIN", "TIBC", "IRON", "RETICCTPCT" in the last 72 hours. Sepsis Labs: No results for input(s): "PROCALCITON", "LATICACIDVEN" in the last 168 hours.  No results found for this or any previous visit (from the past 240 hour(s)).   Radiology Studies: No results found.  Scheduled Meds:  atenolol  50 mg Oral Daily   donepezil  10 mg Oral Daily   enoxaparin (LOVENOX) injection  40 mg Subcutaneous Q24H   feeding supplement  237 mL Oral BID BM   FLUoxetine  10 mg Oral Daily   levothyroxine  100 mcg Oral Q0600   lisinopril  40 mg Oral Daily   multivitamin with minerals  1 tablet Oral Daily   Continuous Infusions:   LOS: 57 days   Marguerita Merles, DO [facility] Available via Epic secure chat 7am-7pm After these hours, please refer to coverage provider listed on [email or web address] 11/22/2023, 8:40 AM

## 2023-11-22 NOTE — Plan of Care (Signed)
  Problem: Clinical Measurements: Goal: Ability to maintain clinical measurements within normal limits will improve Outcome: Progressing Goal: Will remain free from infection Outcome: Progressing   

## 2023-11-22 NOTE — Plan of Care (Signed)

## 2023-11-23 DIAGNOSIS — F03918 Unspecified dementia, unspecified severity, with other behavioral disturbance: Secondary | ICD-10-CM | POA: Diagnosis not present

## 2023-11-23 DIAGNOSIS — G9341 Metabolic encephalopathy: Secondary | ICD-10-CM | POA: Diagnosis not present

## 2023-11-23 DIAGNOSIS — N390 Urinary tract infection, site not specified: Secondary | ICD-10-CM | POA: Diagnosis not present

## 2023-11-23 DIAGNOSIS — F0390 Unspecified dementia without behavioral disturbance: Secondary | ICD-10-CM | POA: Diagnosis not present

## 2023-11-23 LAB — COMPREHENSIVE METABOLIC PANEL
ALT: 15 U/L (ref 0–44)
AST: 18 U/L (ref 15–41)
Albumin: 3 g/dL — ABNORMAL LOW (ref 3.5–5.0)
Alkaline Phosphatase: 37 U/L — ABNORMAL LOW (ref 38–126)
Anion gap: 7 (ref 5–15)
BUN: 36 mg/dL — ABNORMAL HIGH (ref 8–23)
CO2: 30 mmol/L (ref 22–32)
Calcium: 8.7 mg/dL — ABNORMAL LOW (ref 8.9–10.3)
Chloride: 103 mmol/L (ref 98–111)
Creatinine, Ser: 0.86 mg/dL (ref 0.44–1.00)
GFR, Estimated: >60 mL/min (ref >60)
Glucose, Bld: 98 mg/dL (ref 70–99)
Potassium: 4.2 mmol/L (ref 3.5–5.1)
Sodium: 140 mmol/L (ref 135–145)
Total Bilirubin: 0.6 mg/dL (ref <1.2)
Total Protein: 5.4 g/dL — ABNORMAL LOW (ref 6.5–8.1)

## 2023-11-23 LAB — CBC WITH DIFFERENTIAL/PLATELET
Abs Immature Granulocytes: 0.02 10*3/uL (ref 0.00–0.07)
Basophils Absolute: 0 10*3/uL (ref 0.0–0.1)
Basophils Relative: 0 %
Eosinophils Absolute: 0.3 10*3/uL (ref 0.0–0.5)
Eosinophils Relative: 4 %
HCT: 34.3 % — ABNORMAL LOW (ref 36.0–46.0)
Hemoglobin: 11.3 g/dL — ABNORMAL LOW (ref 12.0–15.0)
Immature Granulocytes: 0 %
Lymphocytes Relative: 33 %
Lymphs Abs: 1.9 10*3/uL (ref 0.7–4.0)
MCH: 33.3 pg (ref 26.0–34.0)
MCHC: 32.9 g/dL (ref 30.0–36.0)
MCV: 101.2 fL — ABNORMAL HIGH (ref 80.0–100.0)
Monocytes Absolute: 0.5 10*3/uL (ref 0.1–1.0)
Monocytes Relative: 8 %
Neutro Abs: 3.1 10*3/uL (ref 1.7–7.7)
Neutrophils Relative %: 55 %
Platelets: 281 10*3/uL (ref 150–400)
RBC: 3.39 MIL/uL — ABNORMAL LOW (ref 3.87–5.11)
RDW: 14.2 % (ref 11.5–15.5)
WBC: 5.7 10*3/uL (ref 4.0–10.5)
nRBC: 0 % (ref 0.0–0.2)

## 2023-11-23 LAB — PHOSPHORUS: Phosphorus: 3.2 mg/dL (ref 2.5–4.6)

## 2023-11-23 LAB — MAGNESIUM: Magnesium: 2 mg/dL (ref 1.7–2.4)

## 2023-11-23 MED ORDER — ORAL CARE MOUTH RINSE
15.0000 mL | OROMUCOSAL | Status: DC
  Administered 2023-11-24 – 2023-12-02 (×17): 15 mL via OROMUCOSAL

## 2023-11-23 MED ORDER — ORAL CARE MOUTH RINSE: 15.0000 mL | OROMUCOSAL | Status: DC | PRN

## 2023-11-23 NOTE — Progress Notes (Addendum)
PROGRESS NOTE    Allison [name]  ZOX:[id number] DOB: 07/24/1932 DOA: 09/25/2023 PCP: Alan Mulder, MD   Brief Narrative:  HPI was taken from Dr. Para March: Allison [name] is a 89 y.o. female with medical history significant for Dementia, hypertension, previously at [facility], now on home hospice, sent in by hospice via EMS as and APS case.  Hospice staff had reported the patient had very poor oral intake.  Initial labs consistent with urinary tract infection which grew E. coli and Pseudomonas and completed treatment with Rocephin and Cipro on 10/14.  Patient is currently medically stable, awaiting safe disposition.  Assessment and Plan:   Urinary tract infection secondary to E. coli and Pseudomonas:  -Completed treatment with Rocephin and Cipro on 10/14 -Afebrile and has no Leukocytosis on last check Recent Labs  Lab 10/27/23 0849 11/23/23 0615  WBC 4.2 5.7    Acute metabolic encephalopathy:  -likely secondary to UTI but superimposed on Chronic Dementia -Delirium Precautions   Dementia: with behavioral disturbance.  -Avoid Benzodiazepines and initiate Haldol PRN if necessary -C/w Donepezil 10 mg po Daily   Sinus Bradycardia -Continue on reduced dose of Atenolol 50 mg  -HR's now ranging from 62-86   HTN -Continue on Lisinopril 40 mg po Daily, Atenolol 50 mg po Daily, -C/w IV Hydralazine 10 mg q6hprn SBP >180 or DBP>110  -Continue to Monitor BP per Protocol -Last BP reading was 136/83  Hypokalemia -Patient's K+ Level Trend: Recent Labs  Lab 10/27/23 0849 11/23/23 0615  K 4.4 4.2  -Continue to Monitor and Replete as Necessary  Normocytic Anemia -Hgb/Hct Trend: Recent Labs  Lab 10/27/23 0849 11/23/23 0615  HGB 12.3 11.3*  HCT 37.0 34.3*  MCV 98.9 101.2*  -Check Anemia Panel on next Blood Draw -Continue to Monitor for S/Sx of Bleeding; No overt bleeding noted -Repeat CBC in the AM  Severe protein calorie malnutrition:  Nutrition Status: Nutrition  Problem: Severe Malnutrition Etiology: social / environmental circumstances Signs/Symptoms: severe fat depletion, severe muscle depletion, percent weight loss Percent weight loss: 25 %  -C/w Nutritional Supplements  Constipation -C/w Bisacodyl 10 mg po Daily PRN Moderate Constipation  Underweight Estimated body mass index is 17.15 kg/m as calculated from the following:   Height as of this encounter: 5\' 6"  (1.676 m).   Weight as of this encounter: 48.2 kg.   Pressure Injury, poA Pressure Injury 09/26/23 Hip Anterior;Left;Proximal Stage 2 -  Partial thickness loss of dermis presenting as a shallow open injury with a red, pink wound bed without slough. (Active)  09/26/23 0230  Location: Hip  Location Orientation: Anterior;Left;Proximal  Staging: Stage 2 -  Partial thickness loss of dermis presenting as a shallow open injury with a red, pink wound bed without slough.  Wound Description (Comments):   Present on Admission: Yes     Pressure Injury 10/14/23 Thigh Left;Posterior Stage 2 -  Partial thickness loss of dermis presenting as a shallow open injury with a red, pink wound bed without slough. (Active)  10/14/23 1124  Location: Thigh  Location Orientation: Left;Posterior  Staging: Stage 2 -  Partial thickness loss of dermis presenting as a shallow open injury with a red, pink wound bed without slough.  Wound Description (Comments):   Present on Admission:      Pressure Injury 10/14/23 Ankle Left;Lateral Stage 1 -  Intact skin with non-blanchable redness of a localized area usually over a bony prominence. (Active)  10/14/23 1125  Location: Ankle  Location Orientation: Left;Lateral  Staging: Stage  1 -  Intact skin with non-blanchable redness of a localized area usually over a bony prominence.  Wound Description (Comments):   Present on Admission:    Hypoalbuminemia -Patient's Albumin Trend: Recent Labs  Lab 11/23/23 0615  ALBUMIN 3.0*  -Continue to Monitor and Trend and  repeat CMP on next lab check  NO FURTHER LABS BEING CHECKED REGULARLY AT THIS TIME AND ONLY BEING CHECKED INTERMITTENTLY    DVT prophylaxis: enoxaparin (LOVENOX) injection 40 mg Start: 09/26/23 1000    Code Status: Do not attempt resuscitation (DNR) PRE-ARREST INTERVENTIONS DESIRED Family Communication: No family present at bedside  Disposition Plan:  Level of care: Med-Surg Status is: Inpatient Remains inpatient appropriate because: Pt is from home, admitted with UTI, dementia, medically stable to be discharged. TOC is following, no safe discharge plan at this time.   Consultants:  None   Procedures:  As delineated as above  Antimicrobials:  Anti-infectives (From admission, onward)    Start     Dose/Rate Route Frequency Ordered Stop   10/02/23 2000  ciprofloxacin (CIPRO) tablet 500 mg        500 mg Oral 2 times daily 10/02/23 1258 10/05/23 0759   10/01/23 2000  ciprofloxacin (CIPRO) tablet 500 mg  Status:  Discontinued        500 mg Oral 2 times daily 10/01/23 1119 10/02/23 1258   09/28/23 1315  ciprofloxacin (CIPRO) tablet 500 mg  Status:  Discontinued        500 mg Oral 2 times daily 09/28/23 1224 10/01/23 1119   09/26/23 2000  cefTRIAXone (ROCEPHIN) 1 g in sodium chloride 0.9 % 100 mL IVPB  Status:  Discontinued        1 g 200 mL/hr over 30 Minutes Intravenous Every 24 hours 09/25/23 2228 09/28/23 1225   09/25/23 2130  cefTRIAXone (ROCEPHIN) 1 g in sodium chloride 0.9 % 100 mL IVPB        1 g 200 mL/hr over 30 Minutes Intravenous  Once 09/25/23 2121 09/25/23 2210       Subjective: Seen and examined at bedside and she is resting comfortably and in no acute distress.  Pleasantly confused and demented.  She denies any complaints at this time and denies any nausea or vomiting.  Objective: Vitals:   11/22/23 2018 11/22/23 2020 11/23/23 0448 11/23/23 0500  BP: (!) 124/91  136/83   Pulse: 65  (!) 56   Resp: 18  16   Temp:      TempSrc: Oral Oral Oral   SpO2: 95%  93%    Weight:    48.2 kg  Height:        Intake/Output Summary (Last 24 hours) at 11/23/2023 1037 Last data filed at 11/22/2023 1740 Gross per 24 hour  Intake 240 ml  Output --  Net 240 ml   Filed Weights   11/20/23 0700 11/22/23 1826 11/23/23 0500  Weight: 44.1 kg 47.2 kg 48.2 kg   Examination: Physical Exam:  Constitutional: Thin elderly Caucasian female respiratory demented no acute distress Respiratory: Slightly diminished to auscultation bilaterally, no wheezing, rales, rhonchi or crackles. Normal respiratory effort and patient is not tachypenic. No accessory muscle use.  Unlabored breathing Cardiovascular: RRR, no murmurs / rubs / gallops. S1 and S2 auscultated. No extremity edema. Abdomen: Soft, non-tender, non-distended. Bowel sounds positive.  GU: Deferred. Musculoskeletal: No clubbing / cyanosis of digits/nails. No joint deformity upper and lower extremities. Skin: No rashes, lesions, ulcers on a limited skin evaluation. No induration; Warm and dry.  Neurologic: CN 2-12 grossly intact with no focal deficits. Romberg sign cerebellar reflexes not assessed.  Psychiatric: Impaired judgment and insight remains confused and pleasantly demented  Data Reviewed: I have personally reviewed following labs and imaging studies  CBC: Recent Labs  Lab 11/23/23 0615  WBC 5.7  NEUTROABS 3.1  HGB 11.3*  HCT 34.3*  MCV 101.2*  PLT 281   Basic Metabolic Panel: Recent Labs  Lab 11/23/23 0615  NA 140  K 4.2  CL 103  CO2 30  GLUCOSE 98  BUN 36*  CREATININE 0.86  CALCIUM 8.7*  MG 2.0  PHOS 3.2   GFR: Estimated Creatinine Clearance: 33.7 mL/min (by C-G formula based on SCr of 0.86 mg/dL). Liver Function Tests: Recent Labs  Lab 11/23/23 0615  AST 18  ALT 15  ALKPHOS 37*  BILITOT 0.6  PROT 5.4*  ALBUMIN 3.0*   No results for input(s): "LIPASE", "AMYLASE" in the last 168 hours. No results for input(s): "AMMONIA" in the last 168 hours. Coagulation Profile: No results  for input(s): "INR", "PROTIME" in the last 168 hours. Cardiac Enzymes: No results for input(s): "CKTOTAL", "CKMB", "CKMBINDEX", "TROPONINI" in the last 168 hours. BNP (last 3 results) No results for input(s): "PROBNP" in the last 8760 hours. HbA1C: No results for input(s): "HGBA1C" in the last 72 hours. CBG: No results for input(s): "GLUCAP" in the last 168 hours. Lipid Profile: No results for input(s): "CHOL", "HDL", "LDLCALC", "TRIG", "CHOLHDL", "LDLDIRECT" in the last 72 hours. Thyroid Function Tests: No results for input(s): "TSH", "T4TOTAL", "FREET4", "T3FREE", "THYROIDAB" in the last 72 hours. Anemia Panel: No results for input(s): "VITAMINB12", "FOLATE", "FERRITIN", "TIBC", "IRON", "RETICCTPCT" in the last 72 hours. Sepsis Labs: No results for input(s): "PROCALCITON", "LATICACIDVEN" in the last 168 hours.  No results found for this or any previous visit (from the past 240 hour(s)).   Radiology Studies: No results found.  Scheduled Meds:  atenolol  50 mg Oral Daily   donepezil  10 mg Oral Daily   enoxaparin (LOVENOX) injection  40 mg Subcutaneous Q24H   feeding supplement  237 mL Oral BID BM   FLUoxetine  10 mg Oral Daily   levothyroxine  100 mcg Oral Q0600   lisinopril  40 mg Oral Daily   multivitamin with minerals  1 tablet Oral Daily   mouth rinse  15 mL Mouth Rinse 4 times per day   Continuous Infusions:   LOS: 58 days   Marguerita Merles, DO [facility] Available via Epic secure chat 7am-7pm After these hours, please refer to coverage provider listed on [email or web address] 11/23/2023, 10:37 AM

## 2023-11-24 DIAGNOSIS — G9341 Metabolic encephalopathy: Secondary | ICD-10-CM | POA: Diagnosis not present

## 2023-11-24 DIAGNOSIS — F0390 Unspecified dementia without behavioral disturbance: Secondary | ICD-10-CM | POA: Diagnosis not present

## 2023-11-24 DIAGNOSIS — N3000 Acute cystitis without hematuria: Secondary | ICD-10-CM | POA: Diagnosis not present

## 2023-11-24 DIAGNOSIS — F03918 Unspecified dementia, unspecified severity, with other behavioral disturbance: Secondary | ICD-10-CM | POA: Diagnosis not present

## 2023-11-24 NOTE — TOC Progression Note (Signed)
Transition of Care [facility]) - Progression Note    Patient Details  Name: Allison [name] MRN: [id number] Date of Birth: [birth year]  [facility] of [facility]) CM/SW Contact  Allena Katz, LCSW Phone Number: 11/24/2023, 11:59 AM  Clinical Narrative:   Messaged Nina brown at [facility] for an update on patients Protective order.     Expected Discharge Plan:  (TBD) Barriers to Discharge: Continued Medical Work up  Expected Discharge Plan and Services       Living arrangements for the past 2 months: Single Family Home                                       Social Determinants of Health (SDOH) Interventions SDOH Screenings   Food Insecurity: Patient Unable To Answer (09/26/2023)  Housing: High Risk (09/26/2023)  Transportation Needs: Patient Unable To Answer (09/26/2023)  Utilities: Patient Unable To Answer (09/26/2023)  Tobacco Use: Low Risk  (10/12/2023)    Readmission Risk Interventions     No data to display

## 2023-11-24 NOTE — Plan of Care (Signed)

## 2023-11-24 NOTE — Progress Notes (Signed)
PROGRESS NOTE    Allison [name]  [id number]:[id number] DOB: [birth year] DOA: 09/25/2023 PCP: Alan Mulder, MD   Brief Narrative:  HPI was taken from Dr. Para March: Allison [name] is a 89 y.o. female with medical history significant for Dementia, hypertension, previously at [facility], now on home hospice, sent in by hospice via EMS as and APS case.  Hospice staff had reported the patient had very poor oral intake.  Initial labs consistent with urinary tract infection which grew E. coli and Pseudomonas and completed treatment with Rocephin and Cipro on 10/14.  Patient is currently medically stable, awaiting safe disposition.  Assessment and Plan:   Urinary tract infection secondary to E. coli and Pseudomonas:  -Completed treatment with Rocephin and Cipro on 10/14 -Afebrile and has no Leukocytosis on last check Recent Labs  Lab 10/27/23 0849 11/23/23 0615  WBC 4.2 5.7    Acute metabolic encephalopathy:  -likely secondary to UTI but superimposed on Chronic Dementia -Delirium Precautions   Dementia: with behavioral disturbance.  -Avoid Benzodiazepines and initiate Haldol PRN if necessary -C/w Donepezil 10 mg po Daily   Sinus Bradycardia -Continue on reduced dose of Atenolol 50 mg  -HR's now ranging from 62-86   HTN -Continue on Lisinopril 40 mg po Daily, Atenolol 50 mg po Daily, -C/w IV Hydralazine 10 mg q6hprn SBP >180 or DBP>110  -Continue to Monitor BP per Protocol -Last BP reading was 136/83  Hypokalemia -Patient's K+ Level Trend: Recent Labs  Lab 10/27/23 0849 11/23/23 0615  K 4.4 4.2  -Continue to Monitor and Replete as Necessary  Normocytic Anemia -Hgb/Hct Trend: Recent Labs  Lab 10/27/23 0849 11/23/23 0615  HGB 12.3 11.3*  HCT 37.0 34.3*  MCV 98.9 101.2*  -Check Anemia Panel on next Blood Draw -Continue to Monitor for S/Sx of Bleeding; No overt bleeding noted -Repeat CBC in the AM  Severe protein calorie malnutrition:  Nutrition Status: Nutrition  Problem: Severe Malnutrition Etiology: social / environmental circumstances Signs/Symptoms: severe fat depletion, severe muscle depletion, percent weight loss Percent weight loss: 25 %  -C/w Nutritional Supplements  Constipation -C/w Bisacodyl 10 mg po Daily PRN Moderate Constipation  Underweight Estimated body mass index is 17.15 kg/m as calculated from the following:   Height as of this encounter: 5\' 6"  (1.676 m).   Weight as of this encounter: 48.2 kg.   Pressure Injury, poA Pressure Injury 09/26/23 Hip Anterior;Left;Proximal Stage 2 -  Partial thickness loss of dermis presenting as a shallow open injury with a red, pink wound bed without slough. (Active)  09/26/23 0230  Location: Hip  Location Orientation: Anterior;Left;Proximal  Staging: Stage 2 -  Partial thickness loss of dermis presenting as a shallow open injury with a red, pink wound bed without slough.  Wound Description (Comments):   Present on Admission: Yes     Pressure Injury 10/14/23 Thigh Left;Posterior Stage 2 -  Partial thickness loss of dermis presenting as a shallow open injury with a red, pink wound bed without slough. (Active)  10/14/23 1124  Location: Thigh  Location Orientation: Left;Posterior  Staging: Stage 2 -  Partial thickness loss of dermis presenting as a shallow open injury with a red, pink wound bed without slough.  Wound Description (Comments):   Present on Admission:      Pressure Injury 10/14/23 Ankle Left;Lateral Stage 1 -  Intact skin with non-blanchable redness of a localized area usually over a bony prominence. (Active)  10/14/23 1125  Location: Ankle  Location Orientation: Left;Lateral  Staging: Stage  1 -  Intact skin with non-blanchable redness of a localized area usually over a bony prominence.  Wound Description (Comments):   Present on Admission:    Hypoalbuminemia -Patient's Albumin Trend: Recent Labs  Lab 11/23/23 0615  ALBUMIN 3.0*  -Continue to Monitor and Trend and  repeat CMP on next lab check  NO FURTHER LABS BEING CHECKED REGULARLY AT THIS TIME AND ONLY BEING CHECKED INTERMITTENTLY    DVT prophylaxis: enoxaparin (LOVENOX) injection 40 mg Start: 09/26/23 1000    Code Status: Do not attempt resuscitation (DNR) PRE-ARREST INTERVENTIONS DESIRED Family Communication: No family present at bedside   Disposition Plan:  Level of care: Med-Surg Status is: Inpatient Remains inpatient appropriate because: Pt is from home, admitted with UTI, dementia, medically stable to be discharged. TOC is following, no safe discharge plan at this time.   Consultants:  None  Procedures:  As delineated as above  Antimicrobials:  Anti-infectives (From admission, onward)    Start     Dose/Rate Route Frequency Ordered Stop   10/02/23 2000  ciprofloxacin (CIPRO) tablet 500 mg        500 mg Oral 2 times daily 10/02/23 1258 10/05/23 0759   10/01/23 2000  ciprofloxacin (CIPRO) tablet 500 mg  Status:  Discontinued        500 mg Oral 2 times daily 10/01/23 1119 10/02/23 1258   09/28/23 1315  ciprofloxacin (CIPRO) tablet 500 mg  Status:  Discontinued        500 mg Oral 2 times daily 09/28/23 1224 10/01/23 1119   09/26/23 2000  cefTRIAXone (ROCEPHIN) 1 g in sodium chloride 0.9 % 100 mL IVPB  Status:  Discontinued        1 g 200 mL/hr over 30 Minutes Intravenous Every 24 hours 09/25/23 2228 09/28/23 1225   09/25/23 2130  cefTRIAXone (ROCEPHIN) 1 g in sodium chloride 0.9 % 100 mL IVPB        1 g 200 mL/hr over 30 Minutes Intravenous  Once 09/25/23 2121 09/25/23 2210       Subjective: Pleasantly confused and eating breakfast with the assistance of the nurse tech.  No issues overnight and denies any complaints or pain.  Feels okay.  Objective: Vitals:   11/23/23 1600 11/23/23 2111 11/24/23 0500 11/24/23 0754  BP: 133/70 (!) 124/54 129/72 (!) 111/97  Pulse: 61 64 61 (!) 108  Resp: 18 18 18 16   Temp:  97.6 F (36.4 C) 97.8 F (36.6 C) (!) 97.2 F (36.2 C)  TempSrc:    Axillary   SpO2:  100% 97% 98%  Weight:      Height:        Intake/Output Summary (Last 24 hours) at 11/24/2023 5784 Last data filed at 11/23/2023 1751 Gross per 24 hour  Intake 240 ml  Output --  Net 240 ml   Filed Weights   11/20/23 0700 11/22/23 1826 11/23/23 0500  Weight: 44.1 kg 47.2 kg 48.2 kg   Examination: Physical Exam:  Constitutional: Thin elderly Caucasian female who is pleasantly demented in no acute distress Respiratory: Mildly diminished to auscultation bilaterally with some coarse breath sounds, no wheezing, rales, rhonchi or crackles. Normal respiratory effort and patient is not tachypenic. No accessory muscle use.  Unlabored breathing Cardiovascular: RRR, no murmurs / rubs / gallops. S1 and S2 auscultated. No extremity edema..  Abdomen: Soft, non-tender, non-distended. Bowel sounds positive.  GU: Deferred. Musculoskeletal: No clubbing / cyanosis of digits/nails. No joint deformity upper and lower extremities. Skin: No rashes, lesions, ulcers  on a limited skin evaluation. No induration; Warm and dry.  Neurologic: CN 2-12 grossly intact with no focal deficits. Romberg sign and cerebellar reflexes not assessed.  Psychiatric: Impaired judgment and insight and remains confused but is pleasantly demented  Data Reviewed: I have personally reviewed following labs and imaging studies  CBC: Recent Labs  Lab 11/23/23 0615  WBC 5.7  NEUTROABS 3.1  HGB 11.3*  HCT 34.3*  MCV 101.2*  PLT 281   Basic Metabolic Panel: Recent Labs  Lab 11/23/23 0615  NA 140  K 4.2  CL 103  CO2 30  GLUCOSE 98  BUN 36*  CREATININE 0.86  CALCIUM 8.7*  MG 2.0  PHOS 3.2   GFR: Estimated Creatinine Clearance: 33.7 mL/min (by C-G formula based on SCr of 0.86 mg/dL). Liver Function Tests: Recent Labs  Lab 11/23/23 0615  AST 18  ALT 15  ALKPHOS 37*  BILITOT 0.6  PROT 5.4*  ALBUMIN 3.0*   No results for input(s): "LIPASE", "AMYLASE" in the last 168 hours. No results for  input(s): "AMMONIA" in the last 168 hours. Coagulation Profile: No results for input(s): "INR", "PROTIME" in the last 168 hours. Cardiac Enzymes: No results for input(s): "CKTOTAL", "CKMB", "CKMBINDEX", "TROPONINI" in the last 168 hours. BNP (last 3 results) No results for input(s): "PROBNP" in the last 8760 hours. HbA1C: No results for input(s): "HGBA1C" in the last 72 hours. CBG: No results for input(s): "GLUCAP" in the last 168 hours. Lipid Profile: No results for input(s): "CHOL", "HDL", "LDLCALC", "TRIG", "CHOLHDL", "LDLDIRECT" in the last 72 hours. Thyroid Function Tests: No results for input(s): "TSH", "T4TOTAL", "FREET4", "T3FREE", "THYROIDAB" in the last 72 hours. Anemia Panel: No results for input(s): "VITAMINB12", "FOLATE", "FERRITIN", "TIBC", "IRON", "RETICCTPCT" in the last 72 hours. Sepsis Labs: No results for input(s): "PROCALCITON", "LATICACIDVEN" in the last 168 hours.  No results found for this or any previous visit (from the past 240 hour(s)).   Radiology Studies: No results found.  Scheduled Meds:  atenolol  50 mg Oral Daily   donepezil  10 mg Oral Daily   enoxaparin (LOVENOX) injection  40 mg Subcutaneous Q24H   feeding supplement  237 mL Oral BID BM   FLUoxetine  10 mg Oral Daily   levothyroxine  100 mcg Oral Q0600   lisinopril  40 mg Oral Daily   multivitamin with minerals  1 tablet Oral Daily   mouth rinse  15 mL Mouth Rinse 4 times per day   Continuous Infusions:   LOS: 59 days   Marguerita Merles, DO [facility] Available via Epic secure chat 7am-7pm After these hours, please refer to coverage provider listed on [email or web address] 11/24/2023, 9:24 AM

## 2023-11-25 DIAGNOSIS — N3001 Acute cystitis with hematuria: Secondary | ICD-10-CM | POA: Diagnosis not present

## 2023-11-25 DIAGNOSIS — G9341 Metabolic encephalopathy: Secondary | ICD-10-CM | POA: Diagnosis not present

## 2023-11-25 DIAGNOSIS — F03918 Unspecified dementia, unspecified severity, with other behavioral disturbance: Secondary | ICD-10-CM | POA: Diagnosis not present

## 2023-11-25 DIAGNOSIS — E876 Hypokalemia: Secondary | ICD-10-CM | POA: Diagnosis not present

## 2023-11-25 NOTE — Plan of Care (Signed)

## 2023-11-25 NOTE — Assessment & Plan Note (Signed)
Continue supplements

## 2023-11-25 NOTE — Progress Notes (Signed)
  Progress Note   Patient: Allison [name] [id number] DOB: July 17, 1932 DOA: 09/25/2023     60 DOS: the patient was seen and examined on 11/25/2023   Brief hospital course: 89 y.o. female with medical history significant for Dementia, hypertension, previously at [facility], now on home hospice, sent in by hospice via EMS as and APS case.  Hospice staff had reported the patient had very poor oral intake.  Initial labs consistent with urinary tract infection which grew E. coli and Pseudomonas and completed treatment with Rocephin and Cipro on 10/14.  Patient is currently medically stable, awaiting safe disposition.    Assessment and Plan: * Acute metabolic encephalopathy Secondary to urinary tract infection and underlying dementia.  Patient able to talk with me and follows some simple commands.  Urinary tract infection Acute cystitis with hematuria.  E. coli and Pseudomonas growing on cultures.  Completed antibiotics on 10/14.  Dementia with behavioral disturbance (HCC) Patient on Aricept and Prozac.  Patient follows some simple commands and able to answer few questions.  No behavioral disturbances noted today.  Protein-calorie malnutrition, severe Continue supplements  Pressure injury of skin 3 decubiti described below.  1 present on admission.  Inadequate oral intake Encouraged eating.  I fed her 2 bites today.  Sinus bradycardia Patient on atenolol   Hypokalemia Replaced during hospital course        Subjective: Patient offers no complaints.  Initially admitted with failure to thrive.  Physical Exam: Vitals:   11/24/23 0500 11/24/23 0754 11/24/23 1656 11/24/23 2229  BP: 129/72 (!) 111/97 100/87 (!) 136/50  Pulse: 61 (!) 108 98 (!) 51  Resp: 18 16 18    Temp: 97.8 F (36.6 C) (!) 97.2 F (36.2 C) 97.9 F (36.6 C)   TempSrc: Axillary  Axillary   SpO2: 97% 98% 95% 98%  Weight:      Height:       Physical Exam HENT:     Head: Normocephalic.      Mouth/Throat:     Pharynx: No oropharyngeal exudate.  Eyes:     General: Lids are normal.     Conjunctiva/sclera: Conjunctivae normal.  Cardiovascular:     Rate and Rhythm: Normal rate and regular rhythm.     Heart sounds: Normal heart sounds, S1 normal and S2 normal.  Pulmonary:     Breath sounds: No decreased breath sounds, wheezing, rhonchi or rales.  Abdominal:     Palpations: Abdomen is soft.     Tenderness: There is no abdominal tenderness.  Musculoskeletal:     Right lower leg: No swelling.     Left lower leg: No swelling.  Skin:    General: Skin is warm.     Findings: No rash.  Neurological:     Mental Status: She is alert.     Data Reviewed: Last creatinine 0.86 last hemoglobin 11.3  Family Communication: Updated patient's son on the phone  Disposition: Status is: Inpatient Remains inpatient appropriate because: We do not have a safe disposition  Planned Discharge Destination: Yet to be determined    Time spent: 27 minutes  Author: Alford Highland, MD 11/25/2023 1:06 PM  For on call review [email or web address].

## 2023-11-25 NOTE — Assessment & Plan Note (Signed)
3 decubiti described below.  1 present on admission.

## 2023-11-25 NOTE — Assessment & Plan Note (Addendum)
Secondary to urinary tract infection and underlying dementia.  Patient is pleasant when you do not try to move her.

## 2023-11-25 NOTE — Assessment & Plan Note (Addendum)
Patient on Aricept and Prozac.  Patient answers a few simple questions..  No behavioral disturbances seen today.  Patient able to move follows some simple commands.

## 2023-11-26 DIAGNOSIS — N3001 Acute cystitis with hematuria: Secondary | ICD-10-CM | POA: Diagnosis not present

## 2023-11-26 DIAGNOSIS — F03918 Unspecified dementia, unspecified severity, with other behavioral disturbance: Secondary | ICD-10-CM | POA: Diagnosis not present

## 2023-11-26 DIAGNOSIS — G9341 Metabolic encephalopathy: Secondary | ICD-10-CM | POA: Diagnosis not present

## 2023-11-26 DIAGNOSIS — R636 Underweight: Secondary | ICD-10-CM

## 2023-11-26 DIAGNOSIS — Z681 Body mass index (BMI) 19 or less, adult: Secondary | ICD-10-CM

## 2023-11-26 NOTE — Plan of Care (Signed)

## 2023-11-26 NOTE — Progress Notes (Signed)
  Progress Note   Patient: Allison [name] [id number] DOB: 05-Sep-1932 DOA: 09/25/2023     61 DOS: the patient was seen and examined on 11/26/2023   Brief hospital course: 89 y.o. female with medical history significant for Dementia, hypertension, previously at [facility], now on home hospice, sent in by hospice via EMS as and APS case.  Hospice staff had reported the patient had very poor oral intake.  Initial labs consistent with urinary tract infection which grew E. coli and Pseudomonas and completed treatment with Rocephin and Cipro on 10/14.  Patient is currently medically stable, awaiting safe disposition.    Assessment and Plan: * Acute metabolic encephalopathy Secondary to urinary tract infection and underlying dementia.  Patient able to talk with me and follows some simple commands.  Urinary tract infection Acute cystitis with hematuria.  E. coli and Pseudomonas growing on cultures.  Completed antibiotics on 10/14.  Dementia with behavioral disturbance (HCC) Patient on Aricept and Prozac.  Patient follows some simple commands and able to answer few questions.  No behavioral disturbances noted today.  Underweight (BMI < 18.5) BMI 17.15  Protein-calorie malnutrition, severe Continue supplements  Pressure injury of skin 3 decubiti described below.  1 present on admission.  Inadequate oral intake Encouraged eating.  I fed her 2 bites today.  Sinus bradycardia Patient on atenolol   Hypokalemia Replaced during hospital course        Subjective: Patient answered a few questions.  I fed her half of a Magic cup.  Patient offers no complaints.  Initially admitted with failure to thrive  Physical Exam: Vitals:   11/24/23 2229 11/25/23 1835 11/25/23 1943 11/26/23 0700  BP:  (!) 162/61 (!) 162/105 (!) 171/61  Pulse: (!) 51 (!) 55 89 (!) 58  Resp:   18 15  Temp:   97.6 F (36.4 C) 98 F (36.7 C)  TempSrc:    Oral  SpO2: 98% 98% (!) 89% 100%  Weight:       Height:       Physical Exam HENT:     Head: Normocephalic.     Mouth/Throat:     Pharynx: No oropharyngeal exudate.  Eyes:     General: Lids are normal.     Conjunctiva/sclera: Conjunctivae normal.  Cardiovascular:     Rate and Rhythm: Normal rate and regular rhythm.     Heart sounds: Normal heart sounds, S1 normal and S2 normal.  Pulmonary:     Breath sounds: No decreased breath sounds, wheezing, rhonchi or rales.  Abdominal:     Palpations: Abdomen is soft.     Tenderness: There is no abdominal tenderness.  Musculoskeletal:     Right lower leg: No swelling.     Left lower leg: No swelling.  Skin:    General: Skin is warm.     Findings: No rash.  Neurological:     Mental Status: She is alert.     Data Reviewed: No new data today  Family Communication: Spoke with son yesterday  Disposition: Status is: Inpatient Remains inpatient appropriate because: We do not have a safe disposition  Planned Discharge Destination: Unknown    Time spent: 27 minutes  Author: Alford Highland, MD 11/26/2023 1:37 PM  For on call review [email or web address].

## 2023-11-26 NOTE — TOC Progression Note (Signed)
Transition of Care [facility]) - Progression Note    Patient Details  Name: [name] MRN: [id number] Date of Birth: 05-29-1932  Transition of Care [facility]) CM/SW Contact  Allena Katz, LCSW Phone Number: 11/26/2023, 4:27 PM  Clinical Narrative:   [name] reports they have obtained a PO and we can now place the patient.     Expected Discharge Plan:  (TBD) Barriers to Discharge: Continued Medical Work up  Expected Discharge Plan and Services       Living arrangements for the past 2 months: Single Family Home                                       Social Determinants of Health (SDOH) Interventions SDOH Screenings   Food Insecurity: Patient Unable To Answer (09/26/2023)  Housing: High Risk (09/26/2023)  Transportation Needs: Patient Unable To Answer (09/26/2023)  Utilities: Patient Unable To Answer (09/26/2023)  Tobacco Use: Low Risk  (10/12/2023)    Readmission Risk Interventions     No data to display

## 2023-11-26 NOTE — Assessment & Plan Note (Signed)
BMI 17.15

## 2023-11-27 DIAGNOSIS — F03918 Unspecified dementia, unspecified severity, with other behavioral disturbance: Secondary | ICD-10-CM | POA: Diagnosis not present

## 2023-11-27 DIAGNOSIS — G9341 Metabolic encephalopathy: Secondary | ICD-10-CM | POA: Diagnosis not present

## 2023-11-27 DIAGNOSIS — R636 Underweight: Secondary | ICD-10-CM | POA: Diagnosis not present

## 2023-11-27 DIAGNOSIS — N3001 Acute cystitis with hematuria: Secondary | ICD-10-CM | POA: Diagnosis not present

## 2023-11-27 NOTE — Plan of Care (Signed)
  Problem: Education: Goal: Knowledge of General Education information will improve Description: Including pain rating scale, medication(s)/side effects and non-pharmacologic comfort measures Outcome: Progressing   Problem: Health Behavior/Discharge Planning: Goal: Ability to manage health-related needs will improve Outcome: Progressing   Problem: Clinical Measurements: Goal: Ability to maintain clinical measurements within normal limits will improve Outcome: Progressing Goal: Will remain free from infection Outcome: Progressing Goal: Diagnostic test results will improve Outcome: Progressing Goal: Respiratory complications will improve Outcome: Progressing Goal: Cardiovascular complication will be avoided Outcome: Progressing   Problem: Coping: Goal: Level of anxiety will decrease Outcome: Progressing   Problem: Elimination: Goal: Will not experience complications related to bowel motility Outcome: Progressing Goal: Will not experience complications related to urinary retention Outcome: Progressing   Problem: Pain Management: Goal: General experience of comfort will improve Outcome: Progressing   Problem: Safety: Goal: Ability to remain free from injury will improve Outcome: Progressing   Problem: Skin Integrity: Goal: Risk for impaired skin integrity will decrease Outcome: Progressing

## 2023-11-27 NOTE — Progress Notes (Signed)
  Progress Note   Patient: Allison [name] [id number] DOB: September 13, 1932 DOA: 09/25/2023     62 DOS: the patient was seen and examined on 11/27/2023   Brief hospital course: 89 y.o. female with medical history significant for Dementia, hypertension, previously at [facility], now on home hospice, sent in by hospice via EMS as and APS case.  Hospice staff had reported the patient had very poor oral intake.  Initial labs consistent with urinary tract infection which grew E. coli and Pseudomonas and completed treatment with Rocephin and Cipro on 10/14.  Patient is currently medically stable, awaiting safe disposition.    Assessment and Plan: * Acute metabolic encephalopathy Secondary to urinary tract infection and underlying dementia.  Patient able to answer few simple questions.  Urinary tract infection Acute cystitis with hematuria.  E. coli and Pseudomonas growing on cultures.  Completed antibiotics on 10/14.  Dementia with behavioral disturbance (HCC) Patient on Aricept and Prozac.  Patient answers a few simple questions..  No behavioral disturbances noted today.  Underweight (BMI < 18.5) BMI 17.15  Protein-calorie malnutrition, severe Continue supplements  Pressure injury of skin 3 decubiti described below.  1 present on admission.  Inadequate oral intake Encouraged eating.  Sinus bradycardia Patient on atenolol   Hypokalemia Replaced during hospital course        Subjective: Patient answers a few simple questions.  Admitted 63 days ago with failure to thrive.  Physical Exam: Vitals:   11/26/23 1713 11/26/23 2100 11/27/23 0850 11/27/23 0852  BP: (!) 142/70 138/61 135/79 135/79  Pulse: 61 62 64   Resp: 16 18    Temp: 98 F (36.7 C) 98 F (36.7 C) 97.7 F (36.5 C)   TempSrc: Oral Oral Axillary   SpO2: 100% 94% 100%   Weight:      Height:       Physical Exam HENT:     Head: Normocephalic.  Eyes:     General: Lids are normal.     Conjunctiva/sclera:  Conjunctivae normal.  Cardiovascular:     Rate and Rhythm: Normal rate and regular rhythm.     Heart sounds: Normal heart sounds, S1 normal and S2 normal.  Pulmonary:     Breath sounds: No decreased breath sounds, wheezing, rhonchi or rales.  Abdominal:     Palpations: Abdomen is soft.     Tenderness: There is no abdominal tenderness.  Musculoskeletal:     Right lower leg: No swelling.     Left lower leg: No swelling.  Skin:    General: Skin is warm.     Findings: No rash.  Neurological:     Mental Status: She is alert.     Data Reviewed: No new data  Disposition: Status is: Inpatient Remains inpatient appropriate because: As per TOC potentially may be able to get to [facility] available next week with hospice following.  Planned Discharge Destination: Skilled nursing facility    Time spent: 28 minutes  Author: Alford Highland, MD 11/27/2023 3:57 PM  For on call review [email or web address].

## 2023-11-27 NOTE — Plan of Care (Signed)
Pt alert to self, follows commands with a history of Dementia. Pt Has no IV, MD aware. POC pt awaiting SNF placement. Pt good eater, must be fed. Bed is low, locked for safety.  Problem: Education: Goal: Knowledge of General Education information will improve Description: Including pain rating scale, medication(s)/side effects and non-pharmacologic comfort measures Outcome: Progressing   Problem: Health Behavior/Discharge Planning: Goal: Ability to manage health-related needs will improve Outcome: Progressing   Problem: Clinical Measurements: Goal: Ability to maintain clinical measurements within normal limits will improve Outcome: Progressing Goal: Will remain free from infection Outcome: Progressing Goal: Diagnostic test results will improve Outcome: Progressing Goal: Respiratory complications will improve Outcome: Progressing Goal: Cardiovascular complication will be avoided Outcome: Progressing   Problem: Activity: Goal: Risk for activity intolerance will decrease Outcome: Progressing   Problem: Nutrition: Goal: Adequate nutrition will be maintained Outcome: Progressing   Problem: Coping: Goal: Level of anxiety will decrease Outcome: Progressing   Problem: Elimination: Goal: Will not experience complications related to bowel motility Outcome: Progressing Goal: Will not experience complications related to urinary retention Outcome: Progressing   Problem: Pain Management: Goal: General experience of comfort will improve Outcome: Progressing   Problem: Safety: Goal: Ability to remain free from injury will improve Outcome: Progressing   Problem: Skin Integrity: Goal: Risk for impaired skin integrity will decrease Outcome: Progressing

## 2023-11-27 NOTE — TOC Progression Note (Signed)
Transition of Care [facility]) - Progression Note    Patient Details  Name: ADELYNN GALLAGHER MRN: [id number] Date of Birth: [birth year]  Transition of Care [facility]) CM/SW Contact  Allena Katz, LCSW Phone Number: 11/27/2023, 3:42 PM  Clinical Narrative: DSS and Lewayne Bunting working on financials to get patient there on Monday.      Expected Discharge Plan:  (TBD) Barriers to Discharge: Continued Medical Work up  Expected Discharge Plan and Services       Living arrangements for the past 2 months: Single Family Home                                       Social Determinants of Health (SDOH) Interventions SDOH Screenings   Food Insecurity: Patient Unable To Answer (09/26/2023)  Housing: High Risk (09/26/2023)  Transportation Needs: Patient Unable To Answer (09/26/2023)  Utilities: Patient Unable To Answer (09/26/2023)  Tobacco Use: Low Risk  (10/12/2023)    Readmission Risk Interventions     No data to display

## 2023-11-28 DIAGNOSIS — F03918 Unspecified dementia, unspecified severity, with other behavioral disturbance: Secondary | ICD-10-CM | POA: Diagnosis not present

## 2023-11-28 DIAGNOSIS — E876 Hypokalemia: Secondary | ICD-10-CM | POA: Diagnosis not present

## 2023-11-28 DIAGNOSIS — N3001 Acute cystitis with hematuria: Secondary | ICD-10-CM | POA: Diagnosis not present

## 2023-11-28 DIAGNOSIS — G9341 Metabolic encephalopathy: Secondary | ICD-10-CM | POA: Diagnosis not present

## 2023-11-28 NOTE — Plan of Care (Addendum)
Patient is alert and oriented X 1. Denies any pain.  Problem: Education: Goal: Knowledge of General Education information will improve Description: Including pain rating scale, medication(s)/side effects and non-pharmacologic comfort measures Outcome: Progressing   Problem: Health Behavior/Discharge Planning: Goal: Ability to manage health-related needs will improve Outcome: Progressing   Problem: Clinical Measurements: Goal: Ability to maintain clinical measurements within normal limits will improve Outcome: Progressing Goal: Will remain free from infection Outcome: Progressing Goal: Diagnostic test results will improve Outcome: Progressing Goal: Respiratory complications will improve Outcome: Progressing Goal: Cardiovascular complication will be avoided Outcome: Progressing   Problem: Activity: Goal: Risk for activity intolerance will decrease Outcome: Progressing   Problem: Nutrition: Goal: Adequate nutrition will be maintained Outcome: Progressing   Problem: Coping: Goal: Level of anxiety will decrease Outcome: Progressing   Problem: Elimination: Goal: Will not experience complications related to bowel motility Outcome: Progressing Goal: Will not experience complications related to urinary retention Outcome: Progressing   Problem: Pain Management: Goal: General experience of comfort will improve Outcome: Progressing   Problem: Safety: Goal: Ability to remain free from injury will improve Outcome: Progressing   Problem: Skin Integrity: Goal: Risk for impaired skin integrity will decrease Outcome: Progressing

## 2023-11-28 NOTE — Progress Notes (Signed)
  Progress Note   Patient: Allison [name] JXB:[id number] DOB: 1932-08-09 DOA: 09/25/2023     63 DOS: the patient was seen and examined on 11/28/2023   Brief hospital course: 89 y.o. female with medical history significant for Dementia, hypertension, previously at [facility], now on home hospice, sent in by hospice via EMS as and APS case.  Hospice staff had reported the patient had very poor oral intake.  Initial labs consistent with urinary tract infection which grew E. coli and Pseudomonas and completed treatment with Rocephin and Cipro on 10/14.  Patient is currently medically stable, awaiting safe disposition.    Assessment and Plan: * Acute metabolic encephalopathy Secondary to urinary tract infection and underlying dementia.  Patient able to answer simple questions and follows some simple commands.  Urinary tract infection Acute cystitis with hematuria.  E. coli and Pseudomonas growing on cultures.  Completed antibiotics on 10/14.  Dementia with behavioral disturbance (HCC) Patient on Aricept and Prozac.  Patient answers a few simple questions..  No behavioral disturbances seen today.  Patient able to move follows some simple commands.  Underweight (BMI < 18.5) BMI 17.15  Protein-calorie malnutrition, severe Continue supplements  Pressure injury of skin 3 decubiti described below.  1 present on admission.  Inadequate oral intake Encouraged eating.  Sinus bradycardia Patient on atenolol   Hypokalemia Replaced during hospital course        Subjective: Patient seen after she ate breakfast.  Feeling okay.  Offers no complaints.  Initially admitted 63 days ago with failure to thrive  Physical Exam: Vitals:   11/27/23 0852 11/27/23 1706 11/28/23 0631 11/28/23 0746  BP: 135/79 112/83 (!) 170/75 (!) 132/59  Pulse:  66 71 62  Resp:  18 16 18   Temp:   (!) 97.5 F (36.4 C) (!) 97.4 F (36.3 C)  TempSrc:   Oral   SpO2:  100% 97% 95%  Weight:      Height:        Physical Exam HENT:     Head: Normocephalic.  Eyes:     General: Lids are normal.     Conjunctiva/sclera: Conjunctivae normal.  Cardiovascular:     Rate and Rhythm: Normal rate and regular rhythm.     Heart sounds: Normal heart sounds, S1 normal and S2 normal.  Pulmonary:     Breath sounds: No decreased breath sounds, wheezing, rhonchi or rales.  Abdominal:     Palpations: Abdomen is soft.     Tenderness: There is no abdominal tenderness.  Musculoskeletal:     Right lower leg: No swelling.     Left lower leg: No swelling.  Skin:    General: Skin is warm.     Findings: No rash.  Neurological:     Mental Status: She is alert.     Comments: Able to straight leg raise for me.  Answer some questions.     Data Reviewed: No new data Family Communication: Updated son on the phone  Disposition: Status is: Inpatient Remains inpatient appropriate because: TOC working on facility for hopefully next week with hospice following  Planned Discharge Destination: Facility with hospice    Time spent: 27 minutes  Author: Alford Highland, MD 11/28/2023 12:11 PM  For on call review [email or web address].

## 2023-11-28 NOTE — Plan of Care (Signed)

## 2023-11-29 DIAGNOSIS — E876 Hypokalemia: Secondary | ICD-10-CM | POA: Diagnosis not present

## 2023-11-29 DIAGNOSIS — F03918 Unspecified dementia, unspecified severity, with other behavioral disturbance: Secondary | ICD-10-CM | POA: Diagnosis not present

## 2023-11-29 DIAGNOSIS — G9341 Metabolic encephalopathy: Secondary | ICD-10-CM | POA: Diagnosis not present

## 2023-11-29 DIAGNOSIS — N3001 Acute cystitis with hematuria: Secondary | ICD-10-CM | POA: Diagnosis not present

## 2023-11-29 DIAGNOSIS — I10 Essential (primary) hypertension: Secondary | ICD-10-CM

## 2023-11-29 NOTE — Plan of Care (Addendum)
Patient is alert and oriented X 1. Bed alarm goes on, this RN and Secretory Darral Dash went to patient room, she already got up from bed and walking in the room. Helped her to get back in bed and bed is low, alarm on and locked for safety.    Problem: Education: Goal: Knowledge of General Education information will improve Description: Including pain rating scale, medication(s)/side effects and non-pharmacologic comfort measures Outcome: Progressing   Problem: Health Behavior/Discharge Planning: Goal: Ability to manage health-related needs will improve Outcome: Progressing   Problem: Clinical Measurements: Goal: Ability to maintain clinical measurements within normal limits will improve Outcome: Progressing Goal: Will remain free from infection Outcome: Progressing Goal: Diagnostic test results will improve Outcome: Progressing Goal: Respiratory complications will improve Outcome: Progressing Goal: Cardiovascular complication will be avoided Outcome: Progressing   Problem: Activity: Goal: Risk for activity intolerance will decrease Outcome: Progressing   Problem: Nutrition: Goal: Adequate nutrition will be maintained Outcome: Progressing   Problem: Coping: Goal: Level of anxiety will decrease Outcome: Progressing   Problem: Elimination: Goal: Will not experience complications related to bowel motility Outcome: Progressing Goal: Will not experience complications related to urinary retention Outcome: Progressing   Problem: Pain Management: Goal: General experience of comfort will improve Outcome: Progressing   Problem: Safety: Goal: Ability to remain free from injury will improve Outcome: Progressing   Problem: Skin Integrity: Goal: Risk for impaired skin integrity will decrease Outcome: Progressing

## 2023-11-29 NOTE — Plan of Care (Signed)

## 2023-11-29 NOTE — Assessment & Plan Note (Signed)
Patient on atenolol and lisinopril

## 2023-11-29 NOTE — Progress Notes (Signed)
  Progress Note   Patient: Allison [name]:[id number] DOB: 12/17/1932 DOA: 09/25/2023     64 DOS: the patient was seen and examined on 11/29/2023   Brief hospital course: 89 y.o. female with medical history significant for Dementia, hypertension, previously at [facility], now on home hospice, sent in by hospice via EMS as and APS case.  Hospice staff had reported the patient had very poor oral intake.  Initial labs consistent with urinary tract infection which grew E. coli and Pseudomonas and completed treatment with Rocephin and Cipro on 10/14.  Patient is currently medically stable, awaiting safe disposition.    Assessment and Plan: * Acute metabolic encephalopathy Secondary to urinary tract infection and underlying dementia.  Patient able to answer simple questions and follows some simple commands.  Urinary tract infection Acute cystitis with hematuria.  E. coli and Pseudomonas growing on cultures.  Completed antibiotics on 10/14.  Dementia with behavioral disturbance (HCC) Patient on Aricept and Prozac.  Patient answers a few simple questions.  Was yelling when they tried to draw blood today.  For me the patient was able to move follows some simple commands.  Essential hypertension Patient on atenolol and lisinopril  Underweight (BMI < 18.5) BMI 17.15  Protein-calorie malnutrition, severe Continue supplements  Pressure injury of skin 3 decubiti described below.  1 present on admission.  Inadequate oral intake Encouraged eating.  Sinus bradycardia Patient on atenolol   Hypokalemia Replaced during hospital course        Subjective: Patient feels okay.  Offers no complaints.  Was yelling out when they were trying to draw her blood today.  Physical Exam: Vitals:   11/29/23 0105 11/29/23 0626 11/29/23 0736 11/29/23 0739  BP:  (!) 170/79 (!) 149/78   Pulse: 60 62 (!) 57   Resp:  16 18   Temp:  (!) 97.5 F (36.4 C)  (!) 97.5 F (36.4 C)  TempSrc:  Oral     SpO2: 97% 99% 100%   Weight:      Height:       Physical Exam HENT:     Head: Normocephalic.  Eyes:     General: Lids are normal.     Conjunctiva/sclera: Conjunctivae normal.  Cardiovascular:     Rate and Rhythm: Normal rate and regular rhythm.     Heart sounds: Normal heart sounds, S1 normal and S2 normal.  Pulmonary:     Breath sounds: No decreased breath sounds, wheezing, rhonchi or rales.  Abdominal:     Palpations: Abdomen is soft.     Tenderness: There is no abdominal tenderness.  Musculoskeletal:     Right lower leg: No swelling.     Left lower leg: No swelling.  Skin:    General: Skin is warm.     Findings: No rash.  Neurological:     Mental Status: She is alert.     Comments: Able to straight leg raise for me.  Answer some questions.     Data Reviewed: I do not think they were able to draw blood today  Disposition: Status is: Inpatient Remains inpatient appropriate because: TOC working on placement options hopefully next week  Planned Discharge Destination: Facility with hospice    Time spent: 26 minutes  Author: Alford Highland, MD 11/29/2023 12:47 PM  For on call review [email or web address].

## 2023-11-30 DIAGNOSIS — G9341 Metabolic encephalopathy: Secondary | ICD-10-CM | POA: Diagnosis not present

## 2023-11-30 DIAGNOSIS — F03918 Unspecified dementia, unspecified severity, with other behavioral disturbance: Secondary | ICD-10-CM | POA: Diagnosis not present

## 2023-11-30 DIAGNOSIS — N3001 Acute cystitis with hematuria: Secondary | ICD-10-CM | POA: Diagnosis not present

## 2023-11-30 DIAGNOSIS — I10 Essential (primary) hypertension: Secondary | ICD-10-CM | POA: Diagnosis not present

## 2023-11-30 LAB — BASIC METABOLIC PANEL
Anion gap: 8 (ref 5–15)
BUN: 44 mg/dL — ABNORMAL HIGH (ref 8–23)
CO2: 27 mmol/L (ref 22–32)
Calcium: 8.7 mg/dL — ABNORMAL LOW (ref 8.9–10.3)
Chloride: 105 mmol/L (ref 98–111)
Creatinine, Ser: 0.87 mg/dL (ref 0.44–1.00)
GFR, Estimated: >60 mL/min (ref >60)
Glucose, Bld: 89 mg/dL (ref 70–99)
Potassium: 4 mmol/L (ref 3.5–5.1)
Sodium: 140 mmol/L (ref 135–145)

## 2023-11-30 LAB — CBC
HCT: 37.8 % (ref 36.0–46.0)
Hemoglobin: 12.6 g/dL (ref 12.0–15.0)
MCH: 33.3 pg (ref 26.0–34.0)
MCHC: 33.3 g/dL (ref 30.0–36.0)
MCV: 100 fL (ref 80.0–100.0)
Platelets: 270 10*3/uL (ref 150–400)
RBC: 3.78 MIL/uL — ABNORMAL LOW (ref 3.87–5.11)
RDW: 13.6 % (ref 11.5–15.5)
WBC: 6.8 10*3/uL (ref 4.0–10.5)
nRBC: 0 % (ref 0.0–0.2)

## 2023-11-30 NOTE — Progress Notes (Signed)
  Progress Note   Patient: Allison [name]:[id number] DOB: [birth year] DOA: 09/25/2023     65 DOS: the patient was seen and examined on 11/30/2023   Brief hospital course: 89 y.o. female with medical history significant for Dementia, hypertension, previously at [facility], now on home hospice, sent in by hospice via EMS as and APS case.  Hospice staff had reported the patient had very poor oral intake.  Initial labs consistent with urinary tract infection which grew E. coli and Pseudomonas and completed treatment with Rocephin and Cipro on 10/14.  Patient is currently medically stable, awaiting safe disposition.    Assessment and Plan: * Acute metabolic encephalopathy Secondary to urinary tract infection and underlying dementia.  Patient is pleasant when you do not try to move her.  Urinary tract infection Acute cystitis with hematuria.  E. coli and Pseudomonas growing on cultures.  Completed antibiotics on 10/14.  Dementia with behavioral disturbance (HCC) Patient on Aricept and Prozac.  Patient answer some simple questions and follow some simple commands.  Essential hypertension Patient on atenolol and lisinopril  Underweight (BMI < 18.5) BMI 17.15  Protein-calorie malnutrition, severe Continue supplements  Pressure injury of skin 3 decubiti described below.  1 present on admission.  Inadequate oral intake Encouraged eating.  Sinus bradycardia Patient on atenolol   Hypokalemia Replaced during hospital course        Subjective: Patient answers a few questions and follows some simple commands.  Here 65 days, admitted with failure to thrive  Physical Exam: Vitals:   11/29/23 1618 11/29/23 2039 11/30/23 0732 11/30/23 0800  BP: 115/65 117/60 (!) 146/85   Pulse: (!) 58 68 64 68  Resp: 18 18 18    Temp: 97.9 F (36.6 C) (!) 97.5 F (36.4 C) 98.2 F (36.8 C)   TempSrc: Axillary     SpO2: 93%  91%   Weight:      Height:       Physical Exam HENT:     Head:  Normocephalic.  Eyes:     General: Lids are normal.     Conjunctiva/sclera: Conjunctivae normal.  Cardiovascular:     Rate and Rhythm: Normal rate and regular rhythm.     Heart sounds: Normal heart sounds, S1 normal and S2 normal.  Pulmonary:     Breath sounds: No decreased breath sounds, wheezing, rhonchi or rales.  Abdominal:     Palpations: Abdomen is soft.     Tenderness: There is no abdominal tenderness.  Musculoskeletal:     Right lower leg: No swelling.     Left lower leg: No swelling.  Skin:    General: Skin is warm.     Findings: No rash.  Neurological:     Mental Status: She is alert.     Comments: Able to straight leg raise for me.  Answer some questions.     Data Reviewed: No new data.  When I did try to draw labs she screamed out and they were unable to obtain.  Disposition: Status is: Inpatient Remains inpatient appropriate because: TOC looking to facility with hospice.  Plan still pending.  Planned Discharge Destination: Rehab    Time spent: 26 minutes  Author: Alford Highland, MD 11/30/2023 2:31 PM  For on call review [email or web address].

## 2023-11-30 NOTE — Plan of Care (Signed)

## 2023-11-30 NOTE — Plan of Care (Incomplete)

## 2023-12-01 DIAGNOSIS — R197 Diarrhea, unspecified: Secondary | ICD-10-CM | POA: Diagnosis not present

## 2023-12-01 DIAGNOSIS — F03918 Unspecified dementia, unspecified severity, with other behavioral disturbance: Secondary | ICD-10-CM | POA: Diagnosis not present

## 2023-12-01 DIAGNOSIS — G9341 Metabolic encephalopathy: Secondary | ICD-10-CM | POA: Diagnosis not present

## 2023-12-01 DIAGNOSIS — N3001 Acute cystitis with hematuria: Secondary | ICD-10-CM | POA: Diagnosis not present

## 2023-12-01 MED ORDER — ATENOLOL 25 MG PO TABS
25.0000 mg | ORAL_TABLET | Freq: Every day | ORAL | Status: DC
  Administered 2023-12-02 – 2023-12-23 (×19): 25 mg via ORAL
  Filled 2023-12-01 (×22): qty 1

## 2023-12-01 MED ORDER — LOPERAMIDE HCL 2 MG PO CAPS
2.0000 mg | ORAL_CAPSULE | Freq: Three times a day (TID) | ORAL | Status: DC | PRN
  Administered 2023-12-01 – 2023-12-23 (×5): 2 mg via ORAL
  Filled 2023-12-01 (×5): qty 1

## 2023-12-01 NOTE — Progress Notes (Signed)
Progress Note   Patient: Allison [name] MWN:[id number] DOB: 28-Jan-1932 DOA: 09/25/2023     66 DOS: the patient was seen and examined on 12/01/2023   Brief hospital course: 89 y.o. female with medical history significant for Dementia, hypertension, previously at [facility], now on home hospice, sent in by hospice via EMS as and APS case.  Hospice staff had reported the patient had very poor oral intake.  Initial labs consistent with urinary tract infection which grew E. coli and Pseudomonas and completed treatment with Rocephin and Cipro on 10/14.  Patient is currently medically stable, awaiting safe disposition.    Assessment and Plan: * Diarrhea Patient had a few episodes of diarrhea.  Nursing staff asked for Imodium.  As needed Imodium ordered.  No fever.  Patient screams out if we draw labs.  Will hold off on labs at this point.  Acute metabolic encephalopathy Secondary to urinary tract infection and underlying dementia.  Patient is pleasant when you do not try to move her.  Urinary tract infection Acute cystitis with hematuria.  E. coli and Pseudomonas growing on cultures.  Completed antibiotics on 10/14.  Dementia with behavioral disturbance (HCC) Patient on Aricept and Prozac.  Patient answer some simple questions and follow some simple commands.  Essential hypertension Patient on atenolol and lisinopril  Underweight (BMI < 18.5) BMI 17.15  Protein-calorie malnutrition, severe Continue supplements  Pressure injury of skin 3 decubiti described below.  1 present on admission.  Inadequate oral intake Encouraged eating.  Sinus bradycardia Patient on atenolol.  Will decrease dose down to 25 mg.   Hypokalemia Replaced during hospital course        Subjective: Patient feels okay.  Has dementia and not the best historian.  Admitted 66 days ago with failure to thrive.  Nurse notified me that she had numerous episodes of diarrhea.  Imodium started.  Physical  Exam: Vitals:   11/30/23 0800 11/30/23 2004 12/01/23 0516 12/01/23 0847  BP:  (!) 128/58 (!) 116/55 (!) 131/57  Pulse: 68 67 65 (!) 59  Resp:  18 16 17   Temp:  98.1 F (36.7 C) 98.1 F (36.7 C) 98.1 F (36.7 C)  TempSrc:   Oral Axillary  SpO2:   99% 100%  Weight:      Height:       Physical Exam HENT:     Head: Normocephalic.  Eyes:     General: Lids are normal.     Conjunctiva/sclera: Conjunctivae normal.  Cardiovascular:     Rate and Rhythm: Normal rate and regular rhythm.     Heart sounds: Normal heart sounds, S1 normal and S2 normal.  Pulmonary:     Breath sounds: No decreased breath sounds, wheezing, rhonchi or rales.  Abdominal:     Palpations: Abdomen is soft.     Tenderness: There is no abdominal tenderness.  Musculoskeletal:     Right lower leg: No swelling.     Left lower leg: No swelling.  Skin:    General: Skin is warm.     Findings: No rash.  Neurological:     Mental Status: She is alert.     Comments: Able to straight leg raise for me.  Answer some questions.     Data Reviewed: No recent labs secondary to patient yelling out when I tried to draw them this week.  Disposition: Status is: Inpatient Remains inpatient appropriate because: TOC trying to work on facility hospice following through APS.  Planned Discharge Destination: Nursing home facility  Time spent: 26 minutes  Author: Alford Highland, MD 12/01/2023 3:01 PM  For on call review [email or web address].

## 2023-12-01 NOTE — Assessment & Plan Note (Addendum)
Resolved

## 2023-12-02 DIAGNOSIS — R197 Diarrhea, unspecified: Secondary | ICD-10-CM | POA: Diagnosis not present

## 2023-12-02 DIAGNOSIS — F03918 Unspecified dementia, unspecified severity, with other behavioral disturbance: Secondary | ICD-10-CM | POA: Diagnosis not present

## 2023-12-02 DIAGNOSIS — N3000 Acute cystitis without hematuria: Secondary | ICD-10-CM | POA: Diagnosis not present

## 2023-12-02 DIAGNOSIS — G9341 Metabolic encephalopathy: Secondary | ICD-10-CM | POA: Diagnosis not present

## 2023-12-02 NOTE — Progress Notes (Signed)
  Progress Note   Patient: Allison [name] [id number]:[id number] DOB: 1932/06/30 DOA: 09/25/2023     67 DOS: the patient was seen and examined on 12/02/2023   Brief hospital course: 89 y.o. female with medical history significant for Dementia, hypertension, previously at [facility], now on home hospice, sent in by hospice via EMS as and APS case.  Hospice staff had reported the patient had very poor oral intake.  Initial labs consistent with urinary tract infection which grew E. coli and Pseudomonas and completed treatment with Rocephin and Cipro on 10/14.  Patient is currently medically stable, awaiting safe disposition.  12/11: Vital stable, no acute concern-still pending disposition.    Assessment and Plan: * Diarrhea Patient had a few episodes of diarrhea.  Nursing staff asked for Imodium.  As needed Imodium ordered.  No fever.  Patient screams out if we draw labs.  Will hold off on labs at this point.  Acute metabolic encephalopathy Secondary to urinary tract infection and underlying dementia.  Patient is pleasant when you do not try to move her.  Urinary tract infection Acute cystitis with hematuria.  E. coli and Pseudomonas growing on cultures.  Completed antibiotics on 10/14.  Dementia with behavioral disturbance (HCC) Patient on Aricept and Prozac.  Patient answer some simple questions and follow some simple commands.  Essential hypertension Patient on atenolol and lisinopril  Underweight (BMI < 18.5) BMI 17.15  Protein-calorie malnutrition, severe Continue supplements  Pressure injury of skin 3 decubiti described below.  1 present on admission.  Inadequate oral intake Encouraged eating.  Sinus bradycardia Patient on atenolol.  Will decrease dose down to 25 mg.   Hypokalemia Replaced during hospital course       Subjective: Patient was seen and examined today.  No new concern.  Physical Exam: Vitals:   12/01/23 0847 12/01/23 2115 12/02/23 0805 12/02/23 1640   BP: (!) 131/57 (!) 109/55 (!) 120/55 (!) 122/49  Pulse: (!) 59 60 (!) 56   Resp: 17  17 16   Temp: 98.1 F (36.7 C) 98.1 F (36.7 C) 98.1 F (36.7 C) 98 F (36.7 C)  TempSrc: Axillary  Oral Oral  SpO2: 100%  96% 100%  Weight:      Height:       General.  Frail and malnourished elderly lady, in no acute distress. Pulmonary.  Lungs clear bilaterally, normal respiratory effort. CV.  Regular rate and rhythm, no JVD, rub or murmur. Abdomen.  Soft, nontender, nondistended, BS positive. CNS.  Alert and oriented .  No focal neurologic deficit. Extremities.  No edema, no cyanosis, pulses intact and symmetrical.  Data Reviewed: No recent lab  Disposition: Status is: Inpatient Remains inpatient appropriate because: TOC trying to work on facility hospice following through APS.  Planned Discharge Destination: Nursing home facility  Time spent: 40 minutes  This record has been created using Conservation officer, historic buildings. Errors have been sought and corrected,but may not always be located. Such creation errors do not reflect on the standard of care.   Author: Arnetha Courser, MD 12/02/2023 6:05 PM  For on call review [email or web address].

## 2023-12-02 NOTE — TOC Progression Note (Signed)
Transition of Care [facility]) - Progression Note    Patient Details  Name: Allison [name] MRN: [id number] Date of Birth: April 12, 1932  [facility] CM/SW Contact  Allena Katz, LCSW Phone Number: 12/02/2023, 9:26 AM  Clinical Narrative:   [facility] reports they are still waiting on DSS to write the check so patient can come.    Expected Discharge Plan:  (TBD) Barriers to Discharge: Continued Medical Work up  Expected Discharge Plan and Services       Living arrangements for the past 2 months: Single Family Home                                       Social Determinants of Health (SDOH) Interventions SDOH Screenings   Food Insecurity: Patient Unable To Answer (09/26/2023)  Housing: High Risk (09/26/2023)  Transportation Needs: Patient Unable To Answer (09/26/2023)  Utilities: Patient Unable To Answer (09/26/2023)  Tobacco Use: Low Risk  (10/12/2023)    Readmission Risk Interventions     No data to display

## 2023-12-02 NOTE — Plan of Care (Signed)
  Problem: Education: Goal: Knowledge of General Education information will improve Description Including pain rating scale, medication(s)/side effects and non-pharmacologic comfort measures Outcome: Progressing   

## 2023-12-02 NOTE — Progress Notes (Signed)
Brief Nutrition Follow-Up Note  Patient identified on the Malnutrition Screening Tool (MST) Report  Wt Readings from Last 15 Encounters:  11/23/23 48.2 kg  01/07/23 56.7 kg  07/15/22 50.8 kg  06/04/22 60.2 kg   89 y/o female with h/o dementia, bradycardia, asthma, HTN and followed by hospice who is admitted with UTI and AMS.   10/30- downgraded to dysphagia 2 diet by SLP   11/21- DSS placed protective order to allow placement  Per RN, pt with good appetite, but requires feeding assistance. Pt remains on a dysphagia 2 diet. Noted meal completions 85-100%. See is also consuming Ensure supplements BID.   Per TOC notes, awaiting placement to SN[facility]). Pt is medically stable for discharge.   No nutrition interventions warranted at this time. If nutrition issues arise, please consult RD.   Levada Schilling, RD, LDN, CDCES Registered Dietitian III Certified Diabetes Care and Education Specialist If unable to reach this RD, please use "RD Inpatient" group chat on secure chat between hours of 8am-4 pm daily

## 2023-12-03 ENCOUNTER — Inpatient Hospital Stay: Payer: Medicare Other

## 2023-12-03 DIAGNOSIS — G9341 Metabolic encephalopathy: Secondary | ICD-10-CM | POA: Diagnosis not present

## 2023-12-03 DIAGNOSIS — F03918 Unspecified dementia, unspecified severity, with other behavioral disturbance: Secondary | ICD-10-CM | POA: Diagnosis not present

## 2023-12-03 DIAGNOSIS — N3000 Acute cystitis without hematuria: Secondary | ICD-10-CM | POA: Diagnosis not present

## 2023-12-03 DIAGNOSIS — R197 Diarrhea, unspecified: Secondary | ICD-10-CM | POA: Diagnosis not present

## 2023-12-03 MED ORDER — CALCIUM CARBONATE ANTACID 500 MG PO CHEW
1.0000 | CHEWABLE_TABLET | ORAL | Status: DC | PRN
  Administered 2023-12-03: 200 mg via ORAL
  Filled 2023-12-03: qty 1

## 2023-12-03 MED ORDER — HALOPERIDOL 0.5 MG PO TABS
1.0000 mg | ORAL_TABLET | Freq: Four times a day (QID) | ORAL | Status: DC | PRN
  Administered 2023-12-03 – 2023-12-23 (×23): 1 mg via ORAL
  Filled 2023-12-03 (×23): qty 2

## 2023-12-03 NOTE — Plan of Care (Signed)

## 2023-12-03 NOTE — Plan of Care (Signed)
  Problem: Education: Goal: Knowledge of General Education information will improve Description: Including pain rating scale, medication(s)/side effects and non-pharmacologic comfort measures Outcome: Progressing   Problem: Health Behavior/Discharge Planning: Goal: Ability to manage health-related needs will improve Outcome: Progressing   Problem: Clinical Measurements: Goal: Ability to maintain clinical measurements within normal limits will improve Outcome: Progressing   Problem: Coping: Goal: Level of anxiety will decrease Outcome: Progressing   Problem: Elimination: Goal: Will not experience complications related to bowel motility Outcome: Progressing   Problem: Pain Management: Goal: General experience of comfort will improve Outcome: Progressing   Problem: Safety: Goal: Ability to remain free from injury will improve Outcome: Progressing   Problem: Skin Integrity: Goal: Risk for impaired skin integrity will decrease Outcome: Progressing

## 2023-12-03 NOTE — Progress Notes (Signed)
  Progress Note   Patient: Allison [name]:[id number] DOB: 16-Feb-1932 DOA: 09/25/2023     89 DOS: the patient was seen and examined on 12/03/2023   Brief hospital course: 89 y.o. female with medical history significant for Dementia, hypertension, previously at [facility], now on home hospice, sent in by hospice via EMS as and APS case.  Hospice staff had reported the patient had very poor oral intake.  Initial labs consistent with urinary tract infection which grew E. coli and Pseudomonas and completed treatment with Rocephin and Cipro on 10/14.  Patient is currently medically stable, awaiting safe disposition.  12/11: Vital stable, no acute concern-still pending disposition.  12/12: Vital stable, had a choking spell while eating lunch.  Chest x-ray ordered.  Swallow team switched her to dysphagia 1 diet.  Patient will remain high risk for aspiration due to advanced dementia.   Assessment and Plan: * Diarrhea Patient had a few episodes of diarrhea.  Nursing staff asked for Imodium.  As needed Imodium ordered.  No fever.  Patient screams out if we draw labs.  Will hold off on labs at this point.  Acute metabolic encephalopathy Secondary to urinary tract infection and underlying dementia.  Patient is pleasant when you do not try to move her.  Urinary tract infection Acute cystitis with hematuria.  E. coli and Pseudomonas growing on cultures.  Completed antibiotics on 10/14.  Dementia with behavioral disturbance (HCC) Patient on Aricept and Prozac.  Patient answer some simple questions and follow some simple commands.  Essential hypertension Patient on atenolol and lisinopril  Underweight (BMI < 18.5) BMI 17.15  Protein-calorie malnutrition, severe Continue supplements  Pressure injury of skin 3 decubiti described below.  1 present on admission.  Inadequate oral intake Encouraged eating.  Sinus bradycardia Patient on atenolol.  Will decrease dose down to 25  mg.   Hypokalemia Replaced during hospital course       Subjective: Patient was resting comfortably when seen during morning rounds.  Later had a choking spell while eating lunch.  Physical Exam: Vitals:   12/02/23 1952 12/03/23 0500 12/03/23 0722 12/03/23 1556  BP: 134/71 (!) 159/73 (!) 159/72 (!) 141/63  Pulse: 83 87 (!) 50   Resp: 18 20 17 15   Temp: (!) 97.1 F (36.2 C) 98.1 F (36.7 C) 97.6 F (36.4 C)   TempSrc:  Axillary Axillary   SpO2: 95%  96% 98%  Weight:  51.8 kg    Height:       General.  Frail and malnourished elderly lady, in no acute distress. Pulmonary.  Lungs clear bilaterally, normal respiratory effort. CV.  Regular rate and rhythm, no JVD, rub or murmur. Abdomen.  Soft, nontender, nondistended, BS positive. CNS.  Sleeping.  No focal neurologic deficit. Extremities.  No edema, no cyanosis, pulses intact and symmetrical.  Data Reviewed: No recent lab  Disposition: Status is: Inpatient Remains inpatient appropriate because: TOC trying to work on facility hospice following through APS.  Planned Discharge Destination: Nursing home facility  Time spent: 42 minutes  This record has been created using Conservation officer, historic buildings. Errors have been sought and corrected,but may not always be located. Such creation errors do not reflect on the standard of care.   Author: Arnetha Courser, MD 12/03/2023 4:24 PM  For on call review [email or web address].

## 2023-12-04 DIAGNOSIS — F03918 Unspecified dementia, unspecified severity, with other behavioral disturbance: Secondary | ICD-10-CM | POA: Diagnosis not present

## 2023-12-04 DIAGNOSIS — R197 Diarrhea, unspecified: Secondary | ICD-10-CM | POA: Diagnosis not present

## 2023-12-04 DIAGNOSIS — N3000 Acute cystitis without hematuria: Secondary | ICD-10-CM | POA: Diagnosis not present

## 2023-12-04 DIAGNOSIS — G9341 Metabolic encephalopathy: Secondary | ICD-10-CM | POA: Diagnosis not present

## 2023-12-04 LAB — CBC
HCT: 35.6 % — ABNORMAL LOW (ref 36.0–46.0)
Hemoglobin: 12 g/dL (ref 12.0–15.0)
MCH: 33.4 pg (ref 26.0–34.0)
MCHC: 33.7 g/dL (ref 30.0–36.0)
MCV: 99.2 fL (ref 80.0–100.0)
Platelets: 244 10*3/uL (ref 150–400)
RBC: 3.59 MIL/uL — ABNORMAL LOW (ref 3.87–5.11)
RDW: 13.5 % (ref 11.5–15.5)
WBC: 7.9 10*3/uL (ref 4.0–10.5)
nRBC: 0 % (ref 0.0–0.2)

## 2023-12-04 LAB — BASIC METABOLIC PANEL
Anion gap: 7 (ref 5–15)
BUN: 38 mg/dL — ABNORMAL HIGH (ref 8–23)
CO2: 27 mmol/L (ref 22–32)
Calcium: 8.6 mg/dL — ABNORMAL LOW (ref 8.9–10.3)
Chloride: 104 mmol/L (ref 98–111)
Creatinine, Ser: 0.87 mg/dL (ref 0.44–1.00)
GFR, Estimated: >60 mL/min (ref >60)
Glucose, Bld: 94 mg/dL (ref 70–99)
Potassium: 4.4 mmol/L (ref 3.5–5.1)
Sodium: 138 mmol/L (ref 135–145)

## 2023-12-04 NOTE — Plan of Care (Signed)

## 2023-12-04 NOTE — Progress Notes (Signed)
  Progress Note   Patient: Allison [name] [id number]:[id number] DOB: August 22, 1932 DOA: 09/25/2023     69 DOS: the patient was seen and examined on 12/04/2023   Brief hospital course: 89 y.o. female with medical history significant for Dementia, hypertension, previously at [facility], now on home hospice, sent in by hospice via EMS as and APS case.  Hospice staff had reported the patient had very poor oral intake.  Initial labs consistent with urinary tract infection which grew E. coli and Pseudomonas and completed treatment with Rocephin and Cipro on 10/14.  Patient is currently medically stable, awaiting safe disposition.  12/11: Vital stable, no acute concern-still pending disposition.  12/12: Vital stable, had a choking spell while eating lunch.  Chest x-ray ordered.  Swallow team switched her to dysphagia 1 diet.  Patient will remain high risk for aspiration due to advanced dementia.  12/13: Vitals and labs stable.  No leukocytosis.  Chest x-ray with left subsegmental atelectasis but no acute abnormality.  Will continue monitoring without any antibiotics.   Assessment and Plan: * Diarrhea Patient had a few episodes of diarrhea.  Nursing staff asked for Imodium.  As needed Imodium ordered.  No fever.  Patient screams out if we draw labs.  Will hold off on labs at this point.  Acute metabolic encephalopathy Secondary to urinary tract infection and underlying dementia.  Patient is pleasant when you do not try to move her.  Urinary tract infection Acute cystitis with hematuria.  E. coli and Pseudomonas growing on cultures.  Completed antibiotics on 10/14.  Dementia with behavioral disturbance (HCC) Patient on Aricept and Prozac.  Patient answer some simple questions and follow some simple commands.  Essential hypertension Patient on atenolol and lisinopril  Underweight (BMI < 18.5) BMI 17.15  Protein-calorie malnutrition, severe Continue supplements  Pressure injury of skin 3 decubiti  described below.  1 present on admission.  Inadequate oral intake Encouraged eating.  Sinus bradycardia Patient on atenolol.  Will decrease dose down to 25 mg.   Hypokalemia Replaced during hospital course       Subjective: Patient was sleeping comfortably when seen today.  No new nursing concern..  Physical Exam: Vitals:   12/03/23 2300 12/04/23 0521 12/04/23 0800 12/04/23 1537  BP: 134/72 135/67 94/75 (!) 148/126  Pulse:  60 (!) 57 76  Resp: 17 17 17 18   Temp: 97.6 F (36.4 C) 97.8 F (36.6 C) 97.6 F (36.4 C) 98.1 F (36.7 C)  TempSrc: Axillary     SpO2: 95% 100% 97% 98%  Weight:      Height:       General.  Frail and malnourished elderly lady, in no acute distress. Pulmonary.  Lungs clear bilaterally, normal respiratory effort. CV.  Regular rate and rhythm, no JVD, rub or murmur. Abdomen.  Soft, nontender, nondistended, BS positive. Extremities.  No edema, no cyanosis, pulses intact and symmetrical.  Data Reviewed: No recent lab  Disposition: Status is: Inpatient Remains inpatient appropriate because: TOC trying to work on facility hospice following through APS.  Planned Discharge Destination: Nursing home facility  Time spent: 40 minutes  This record has been created using Conservation officer, historic buildings. Errors have been sought and corrected,but may not always be located. Such creation errors do not reflect on the standard of care.   Author: Arnetha Courser, MD 12/04/2023 4:03 PM  For on call review [email or web address].

## 2023-12-05 DIAGNOSIS — F03918 Unspecified dementia, unspecified severity, with other behavioral disturbance: Secondary | ICD-10-CM | POA: Diagnosis not present

## 2023-12-05 DIAGNOSIS — G9341 Metabolic encephalopathy: Secondary | ICD-10-CM | POA: Diagnosis not present

## 2023-12-05 DIAGNOSIS — R197 Diarrhea, unspecified: Secondary | ICD-10-CM | POA: Diagnosis not present

## 2023-12-05 DIAGNOSIS — N3000 Acute cystitis without hematuria: Secondary | ICD-10-CM | POA: Diagnosis not present

## 2023-12-05 NOTE — Plan of Care (Signed)
  Problem: Education: Goal: Knowledge of General Education information will improve Description: Including pain rating scale, medication(s)/side effects and non-pharmacologic comfort measures Outcome: Progressing   Problem: Health Behavior/Discharge Planning: Goal: Ability to manage health-related needs will improve Outcome: Progressing   Problem: Activity: Goal: Risk for activity intolerance will decrease Outcome: Progressing   Problem: Nutrition: Goal: Adequate nutrition will be maintained Outcome: Progressing   Problem: Coping: Goal: Level of anxiety will decrease Outcome: Progressing   Problem: Elimination: Goal: Will not experience complications related to bowel motility Outcome: Progressing   Problem: Pain Management: Goal: General experience of comfort will improve Outcome: Progressing   Problem: Safety: Goal: Ability to remain free from injury will improve Outcome: Progressing   Problem: Skin Integrity: Goal: Risk for impaired skin integrity will decrease Outcome: Progressing

## 2023-12-05 NOTE — Progress Notes (Signed)
  Progress Note   Patient: Allison [name]:[id number] DOB: 09-Feb-1932 DOA: 09/25/2023     70 DOS: the patient was seen and examined on 12/05/2023   Brief hospital course: 89 y.o. female with medical history significant for Dementia, hypertension, previously at [facility], now on home hospice, sent in by hospice via EMS as and APS case.  Hospice staff had reported the patient had very poor oral intake.  Initial labs consistent with urinary tract infection which grew E. coli and Pseudomonas and completed treatment with Rocephin and Cipro on 10/14.  Patient is currently medically stable, awaiting safe disposition.  12/11: Vital stable, no acute concern-still pending disposition.  12/12: Vital stable, had a choking spell while eating lunch.  Chest x-ray ordered.  Swallow team switched her to dysphagia 1 diet.  Patient will remain high risk for aspiration due to advanced dementia.  12/13: Vitals and labs stable.  No leukocytosis.  Chest x-ray with left subsegmental atelectasis but no acute abnormality.  Will continue monitoring without any antibiotics.   Assessment and Plan: * Diarrhea Patient had a few episodes of diarrhea.  Nursing staff asked for Imodium.  As needed Imodium ordered.  No fever.  Patient screams out if we draw labs.  Will hold off on labs at this point.  Acute metabolic encephalopathy Secondary to urinary tract infection and underlying dementia.  Patient is pleasant when you do not try to move her.  Urinary tract infection Acute cystitis with hematuria.  E. coli and Pseudomonas growing on cultures.  Completed antibiotics on 10/14.  Dementia with behavioral disturbance (HCC) Patient on Aricept and Prozac.  Patient answer some simple questions and follow some simple commands.  Essential hypertension Patient on atenolol and lisinopril  Underweight (BMI < 18.5) BMI 17.15  Protein-calorie malnutrition, severe Continue supplements  Pressure injury of skin 3 decubiti  described below.  1 present on admission.  Inadequate oral intake Encouraged eating.  Sinus bradycardia Patient on atenolol.  Will decrease dose down to 25 mg.   Hypokalemia Replaced during hospital course       Subjective: Patient was sitting comfortably in chair when seen today.  No new concern.  Physical Exam: Vitals:   12/04/23 1723 12/04/23 2304 12/05/23 0333 12/05/23 0756  BP: (!) 112/59 (!) 165/71 (!) 173/81 139/85  Pulse: 65 94  72  Resp:  16 16 16   Temp:  98 F (36.7 C) 97.8 F (36.6 C) 98.3 F (36.8 C)  TempSrc:   Oral Oral  SpO2: 97% 100% 99% 100%  Weight:      Height:       General.  Frail elderly lady, in no acute distress. Pulmonary.  Lungs clear bilaterally, normal respiratory effort. CV.  Regular rate and rhythm, no JVD, rub or murmur. Abdomen.  Soft, nontender, nondistended, BS positive. CNS.  Alert and oriented to self only.  No focal neurologic deficit. Extremities.  No edema, no cyanosis, pulses intact and symmetrical.  Data Reviewed: No recent lab  Disposition: Status is: Inpatient Remains inpatient appropriate because: TOC trying to work on facility hospice following through APS.  Planned Discharge Destination: Nursing home facility  Time spent: 39 minutes  This record has been created using Conservation officer, historic buildings. Errors have been sought and corrected,but may not always be located. Such creation errors do not reflect on the standard of care.   Author: Arnetha Courser, MD 12/05/2023 3:25 PM  For on call review [email or web address].

## 2023-12-06 DIAGNOSIS — F03918 Unspecified dementia, unspecified severity, with other behavioral disturbance: Secondary | ICD-10-CM | POA: Diagnosis not present

## 2023-12-06 DIAGNOSIS — N3000 Acute cystitis without hematuria: Secondary | ICD-10-CM | POA: Diagnosis not present

## 2023-12-06 DIAGNOSIS — G9341 Metabolic encephalopathy: Secondary | ICD-10-CM | POA: Diagnosis not present

## 2023-12-06 DIAGNOSIS — R197 Diarrhea, unspecified: Secondary | ICD-10-CM | POA: Diagnosis not present

## 2023-12-06 NOTE — Progress Notes (Signed)
  Progress Note   Patient: Allison [name] ZOX:[id number] DOB: 1932/06/20 DOA: 09/25/2023     71 DOS: the patient was seen and examined on 12/06/2023   Brief hospital course: 89 y.o. female with medical history significant for Dementia, hypertension, previously at [facility], now on home hospice, sent in by hospice via EMS as and APS case.  Hospice staff had reported the patient had very poor oral intake.  Initial labs consistent with urinary tract infection which grew E. coli and Pseudomonas and completed treatment with Rocephin and Cipro on 10/14.  Patient is currently medically stable, awaiting safe disposition.  12/11: Vital stable, no acute concern-still pending disposition.  12/12: Vital stable, had a choking spell while eating lunch.  Chest x-ray ordered.  Swallow team switched her to dysphagia 1 diet.  Patient will remain high risk for aspiration due to advanced dementia.  12/13: Vitals and labs stable.  No leukocytosis.  Chest x-ray with left subsegmental atelectasis but no acute abnormality.  Will continue monitoring without any antibiotics.   Assessment and Plan: * Diarrhea Resolved.  Acute metabolic encephalopathy Secondary to urinary tract infection and underlying dementia.  Patient is pleasant when you do not try to move her.  Urinary tract infection Acute cystitis with hematuria.  E. coli and Pseudomonas growing on cultures.  Completed antibiotics on 10/14.  Dementia with behavioral disturbance (HCC) Patient on Aricept and Prozac.  Patient answer some simple questions and follow some simple commands.  Essential hypertension Patient on atenolol and lisinopril  Underweight (BMI < 18.5) BMI 17.15  Protein-calorie malnutrition, severe Continue supplements  Pressure injury of skin 3 decubiti described below.  1 present on admission.  Inadequate oral intake Encouraged eating.  Sinus bradycardia Patient on atenolol.  Will decrease dose down to 25  mg.   Hypokalemia Replaced during hospital course       Subjective: Patient was seen and examined today.  No new concern.  Physical Exam: Vitals:   12/06/23 0450 12/06/23 0500 12/06/23 0756 12/06/23 1424  BP: (!) 189/79  122/68 (!) 129/58  Pulse: 69  67 65  Resp: 16  17 18   Temp: (!) 97.5 F (36.4 C)  98.1 F (36.7 C) 97.9 F (36.6 C)  TempSrc:      SpO2: 94%  97% 100%  Weight:  45.1 kg    Height:       General.  Frail elderly lady, in no acute distress. Pulmonary.  Lungs clear bilaterally, normal respiratory effort. CV.  Regular rate and rhythm, no JVD, rub or murmur. Abdomen.  Soft, nontender, nondistended, BS positive. CNS.  Alert and oriented to self only.  No focal neurologic deficit. Extremities.  No edema, no cyanosis, pulses intact and symmetrical.  Data Reviewed: No recent lab  Disposition: Status is: Inpatient Remains inpatient appropriate because: TOC trying to work on facility hospice following through APS.  Planned Discharge Destination: Nursing home facility  Time spent: 38 minutes  This record has been created using Conservation officer, historic buildings. Errors have been sought and corrected,but may not always be located. Such creation errors do not reflect on the standard of care.   Author: Arnetha Courser, MD 12/06/2023 3:04 PM  For on call review [email or web address].

## 2023-12-07 DIAGNOSIS — R197 Diarrhea, unspecified: Secondary | ICD-10-CM | POA: Diagnosis not present

## 2023-12-07 DIAGNOSIS — F03918 Unspecified dementia, unspecified severity, with other behavioral disturbance: Secondary | ICD-10-CM | POA: Diagnosis not present

## 2023-12-07 DIAGNOSIS — G9341 Metabolic encephalopathy: Secondary | ICD-10-CM | POA: Diagnosis not present

## 2023-12-07 DIAGNOSIS — N3000 Acute cystitis without hematuria: Secondary | ICD-10-CM | POA: Diagnosis not present

## 2023-12-07 LAB — URINALYSIS, COMPLETE (UACMP) WITH MICROSCOPIC
Bilirubin Urine: NEGATIVE
Glucose, UA: NEGATIVE mg/dL
Hgb urine dipstick: NEGATIVE
Ketones, ur: NEGATIVE mg/dL
Leukocytes,Ua: NEGATIVE
Nitrite: POSITIVE — AB
Protein, ur: NEGATIVE mg/dL
Specific Gravity, Urine: 1.018 (ref 1.005–1.030)
pH: 6 (ref 5.0–8.0)

## 2023-12-07 MED ORDER — FOSFOMYCIN TROMETHAMINE 3 G PO PACK
3.0000 g | PACK | Freq: Once | ORAL | Status: AC
  Administered 2023-12-07: 3 g via ORAL
  Filled 2023-12-07: qty 3

## 2023-12-07 NOTE — Progress Notes (Signed)
  Progress Note   Patient: Allison [name] [id number] DOB: 12-20-1932 DOA: 09/25/2023     72 DOS: the patient was seen and examined on 12/07/2023   Brief hospital course: 89 y.o. female with medical history significant for Dementia, hypertension, previously at [facility], now on home hospice, sent in by hospice via EMS as and APS case.  Hospice staff had reported the patient had very poor oral intake.  Initial labs consistent with urinary tract infection which grew E. coli and Pseudomonas and completed treatment with Rocephin and Cipro on 10/14.  Patient is currently medically stable, awaiting safe disposition.  12/11: Vital stable, no acute concern-still pending disposition.  12/12: Vital stable, had a choking spell while eating lunch.  Chest x-ray ordered.  Swallow team switched her to dysphagia 1 diet.  Patient will remain high risk for aspiration due to advanced dementia.  12/13: Vitals and labs stable.  No leukocytosis.  Chest x-ray with left subsegmental atelectasis but no acute abnormality.  Will continue monitoring without any antibiotics.  12/16: Some worsening of agitation overnight, nursing concern of recurrence of UTI so UA was obtained which was positive for nitrite. Prior urine cultures with E. coli and Pseudomonas aeruginosa, giving 1 dose of fosfomycin.   Assessment and Plan: * Diarrhea Resolved.  Acute metabolic encephalopathy Secondary to urinary tract infection and underlying dementia.  Patient is pleasant when you do not try to move her.  Urinary tract infection Acute cystitis with hematuria.  E. coli and Pseudomonas growing on cultures.  Completed antibiotics on 10/14.  Dementia with behavioral disturbance (HCC) Patient on Aricept and Prozac.  Patient answer some simple questions and follow some simple commands.  Essential hypertension Patient on atenolol and lisinopril  Underweight (BMI < 18.5) BMI 17.15  Protein-calorie malnutrition, severe Continue  supplements  Pressure injury of skin 3 decubiti described below.  1 present on admission.  Inadequate oral intake Encouraged eating.  Sinus bradycardia Patient on atenolol.  Will decrease dose down to 25 mg.   Hypokalemia Replaced during hospital course       Subjective: Nursing concern of increased agitation since night. She was sitting comfortably when seen today  Physical Exam: Vitals:   12/06/23 0756 12/06/23 1424 12/07/23 0359 12/07/23 1127  BP: 122/68 (!) 129/58 (!) 145/86 117/63  Pulse: 67 65 70 (!) 57  Resp: 17 18 18 17   Temp: 98.1 F (36.7 C) 97.9 F (36.6 C) (!) 97.2 F (36.2 C)   TempSrc:      SpO2: 97% 100% 99% 100%  Weight:      Height:       General.  Frail elderly lady, in no acute distress. Pulmonary.  Lungs clear bilaterally, normal respiratory effort. CV.  Regular rate and rhythm, no JVD, rub or murmur. Abdomen.  Soft, nontender, nondistended, BS positive. CNS.  Awake but appears comfortable, no focal deficit. Extremities.  No edema, no cyanosis, pulses intact and symmetrical.  Data Reviewed: No recent lab  Disposition: Status is: Inpatient Remains inpatient appropriate because: TOC trying to work on facility hospice following through APS.  Planned Discharge Destination: Nursing home facility  Time spent: 40 minutes  This record has been created using Conservation officer, historic buildings. Errors have been sought and corrected,but may not always be located. Such creation errors do not reflect on the standard of care.   Author: Arnetha Courser, MD 12/07/2023 3:28 PM  For on call review [email or web address].

## 2023-12-07 NOTE — Plan of Care (Signed)

## 2023-12-07 NOTE — TOC Progression Note (Signed)
Transition of Care [facility]) - Progression Note    Patient Details  Name: Allison [name] MRN: [id number] Date of Birth: 04-28-1932  [facility] CM/SW Contact  Allena Katz, LCSW Phone Number: 12/07/2023, 9:09 AM  Clinical Narrative:   Lewayne Bunting has sent invoice to DSS and is waiting for them tor respond and pay this.    Expected Discharge Plan:  (TBD) Barriers to Discharge: Continued Medical Work up  Expected Discharge Plan and Services       Living arrangements for the past 2 months: Single Family Home                                       Social Determinants of Health (SDOH) Interventions SDOH Screenings   Food Insecurity: Patient Unable To Answer (09/26/2023)  Housing: High Risk (09/26/2023)  Transportation Needs: Patient Unable To Answer (09/26/2023)  Utilities: Patient Unable To Answer (09/26/2023)  Tobacco Use: Low Risk  (10/12/2023)    Readmission Risk Interventions     No data to display

## 2023-12-08 DIAGNOSIS — G9341 Metabolic encephalopathy: Secondary | ICD-10-CM | POA: Diagnosis not present

## 2023-12-08 DIAGNOSIS — R197 Diarrhea, unspecified: Secondary | ICD-10-CM | POA: Diagnosis not present

## 2023-12-08 NOTE — Progress Notes (Addendum)
Progress Note    [name]  ZDG:[id number] DOB: September 30, 1932  DOA: 09/25/2023 PCP: Alan Mulder, MD      Brief Narrative:    Medical records reviewed and are as summarized below:  Allison [name] is a 89 y.o. female with medical history significant for dementia, hypertension, previously admitted in [place] facility, now on home hospice, was sent in by hospice by EMS to the hospital.  APS is involved in her case.  There were concerns for poor oral intake.  She was found to have acute UTI.  E. coli and Pseudomonas were isolated.  She completed ceftriaxone and ciprofloxacin on October 05, 2023.  On 12/03/2023, patient had a choking spell while eating lunch.  Diet was changed to dysphagia 1 diet because of concerns for high risk for aspiration.    Assessment/Plan:   Principal Problem:   Diarrhea Active Problems:   Acute metabolic encephalopathy   Urinary tract infection   Dementia with behavioral disturbance (HCC)   Hypokalemia   Sinus bradycardia   Inadequate oral intake   Pressure injury of skin   Protein-calorie malnutrition, severe   Underweight (BMI < 18.5)   Essential hypertension   Nutrition Problem: Severe Malnutrition Etiology: social / environmental circumstances  Signs/Symptoms: severe fat depletion, severe muscle depletion, percent weight loss Percent weight loss: 25 %   Body mass index is 16.05 kg/m.  (Underweight)   Acute metabolic encephalopathy on underlying dementia: Continue supportive care.  Continue Aricept and Prozac   Acute UTI: Urine culture had shown E. coli and Pseudomonas and she completed ceftriaxone and ciprofloxacin on October 05, 2023. She was given 1 dose of fosfomycin on 12/07/2023 because of positive nitrates on urinalysis obtained on 12/06/2023.   Hypokalemia: Improved   Intermittent sinus bradycardia: asymptomatic.   Diarrhea: Resolved  Comorbidities include severe protein calorie malnutrition,  underweight, hypertension   Diet Order             DIET - DYS 1 Room service appropriate? No; Fluid consistency: Thin  Diet effective now                            Consultants: None  Procedures: None    Medications:    atenolol  25 mg Oral Daily   donepezil  10 mg Oral Daily   feeding supplement  237 mL Oral BID BM   FLUoxetine  10 mg Oral Daily   levothyroxine  100 mcg Oral Q0600   multivitamin with minerals  1 tablet Oral Daily   Continuous Infusions:   Anti-infectives (From admission, onward)    Start     Dose/Rate Route Frequency Ordered Stop   12/07/23 1630  fosfomycin (MONUROL) packet 3 g        3 g Oral  Once 12/07/23 1527 12/07/23 1707   10/02/23 2000  ciprofloxacin (CIPRO) tablet 500 mg        500 mg Oral 2 times daily 10/02/23 1258 10/05/23 0759   10/01/23 2000  ciprofloxacin (CIPRO) tablet 500 mg  Status:  Discontinued        500 mg Oral 2 times daily 10/01/23 1119 10/02/23 1258   09/28/23 1315  ciprofloxacin (CIPRO) tablet 500 mg  Status:  Discontinued        500 mg Oral 2 times daily 09/28/23 1224 10/01/23 1119   09/26/23 2000  cefTRIAXone (ROCEPHIN) 1 g in sodium chloride 0.9 % 100 mL IVPB  Status:  Discontinued        1 g 200 mL/hr over 30 Minutes Intravenous Every 24 hours 09/25/23 2228 09/28/23 1225   09/25/23 2130  cefTRIAXone (ROCEPHIN) 1 g in sodium chloride 0.9 % 100 mL IVPB        1 g 200 mL/hr over 30 Minutes Intravenous  Once 09/25/23 2121 09/25/23 2210              Family Communication/Anticipated D/C date and plan/Code Status   DVT prophylaxis:      Code Status: Do not attempt resuscitation (DNR) PRE-ARREST INTERVENTIONS DESIRED  Family Communication: None Disposition Plan: Plan to discharge to long-term care facility   Status is: Inpatient Remains inpatient appropriate because: Awaiting placement       Subjective:   Interval events noted.  She is confused and cannot provide any  history.  Objective:    Vitals:   12/07/23 1538 12/07/23 1953 12/08/23 0937 12/08/23 0947  BP: (!) 155/72 (!) 165/90 (!) 198/67 (!) 160/80  Pulse: 64 94 61   Resp: 18 18 16    Temp: 98.2 F (36.8 C) (!) 97.5 F (36.4 C) 97.8 F (36.6 C)   TempSrc: Axillary  Oral   SpO2: 98% 99% 100%   Weight:      Height:       No data found.  No intake or output data in the 24 hours ending 12/08/23 1335 Filed Weights   11/23/23 0500 12/03/23 0500 12/06/23 0500  Weight: 48.2 kg 51.8 kg 45.1 kg    Exam:  GEN: NAD SKIN: Warm and dry EYES: No pallor or icterus ENT: MMM CV: RRR PULM: CTA B ABD: soft, ND, NT, +BS CNS: Alert but disoriented EXT: No edema or tenderness        Data Reviewed:   I have personally reviewed following labs and imaging studies:  Labs: Labs show the following:   Basic Metabolic Panel: Recent Labs  Lab 12/04/23 0429  NA 138  K 4.4  CL 104  CO2 27  GLUCOSE 94  BUN 38*  CREATININE 0.87  CALCIUM 8.6*   GFR Estimated Creatinine Clearance: 31.2 mL/min (by C-G formula based on SCr of 0.87 mg/dL). Liver Function Tests: No results for input(s): "AST", "ALT", "ALKPHOS", "BILITOT", "PROT", "ALBUMIN" in the last 168 hours. No results for input(s): "LIPASE", "AMYLASE" in the last 168 hours. No results for input(s): "AMMONIA" in the last 168 hours. Coagulation profile No results for input(s): "INR", "PROTIME" in the last 168 hours.  CBC: Recent Labs  Lab 12/04/23 0429  WBC 7.9  HGB 12.0  HCT 35.6*  MCV 99.2  PLT 244   Cardiac Enzymes: No results for input(s): "CKTOTAL", "CKMB", "CKMBINDEX", "TROPONINI" in the last 168 hours. BNP (last 3 results) No results for input(s): "PROBNP" in the last 8760 hours. CBG: No results for input(s): "GLUCAP" in the last 168 hours. D-Dimer: No results for input(s): "DDIMER" in the last 72 hours. Hgb A1c: No results for input(s): "HGBA1C" in the last 72 hours. Lipid Profile: No results for input(s):  "CHOL", "HDL", "LDLCALC", "TRIG", "CHOLHDL", "LDLDIRECT" in the last 72 hours. Thyroid function studies: No results for input(s): "TSH", "T4TOTAL", "T3FREE", "THYROIDAB" in the last 72 hours.  Invalid input(s): "FREET3" Anemia work up: No results for input(s): "VITAMINB12", "FOLATE", "FERRITIN", "TIBC", "IRON", "RETICCTPCT" in the last 72 hours. Sepsis Labs: Recent Labs  Lab 12/04/23 0429  WBC 7.9    Microbiology No results found for this or any previous visit (from the past 240 hours).  Procedures and diagnostic studies:  No results found.             LOS: 73 days   Layia Walla  [employer] loss adjuster on [email or web address]. If 7PM-7AM, please contact night-coverage at [email or web address]     12/08/2023, 1:35 PM

## 2023-12-08 NOTE — Plan of Care (Signed)

## 2023-12-08 NOTE — Plan of Care (Addendum)
Patient is alert and disoriented X 4. Pt is  agitated and trying to get up from bed  multiple times. She got haldol as order. It has little effect. Safety rounds have been doing. Plan of care ongoing.   Problem: Education: Goal: Knowledge of General Education information will improve Description: Including pain rating scale, medication(s)/side effects and non-pharmacologic comfort measures Outcome: Progressing   Problem: Health Behavior/Discharge Planning: Goal: Ability to manage health-related needs will improve Outcome: Progressing   Problem: Clinical Measurements: Goal: Ability to maintain clinical measurements within normal limits will improve Outcome: Progressing Goal: Will remain free from infection Outcome: Progressing Goal: Diagnostic test results will improve Outcome: Progressing Goal: Respiratory complications will improve Outcome: Progressing Goal: Cardiovascular complication will be avoided Outcome: Progressing   Problem: Activity: Goal: Risk for activity intolerance will decrease Outcome: Progressing   Problem: Nutrition: Goal: Adequate nutrition will be maintained Outcome: Progressing   Problem: Coping: Goal: Level of anxiety will decrease Outcome: Progressing   Problem: Elimination: Goal: Will not experience complications related to bowel motility Outcome: Progressing Goal: Will not experience complications related to urinary retention Outcome: Progressing   Problem: Pain Management: Goal: General experience of comfort will improve Outcome: Progressing   Problem: Safety: Goal: Ability to remain free from injury will improve Outcome: Progressing   Problem: Skin Integrity: Goal: Risk for impaired skin integrity will decrease Outcome: Progressing

## 2023-12-08 NOTE — TOC Progression Note (Signed)
Transition of Care [facility]) - Progression Note    Patient Details  Name: Allison [name] MRN: [id number] Date of Birth: 1932-09-03  Transition of Care [facility]) CM/SW Contact  Allena Katz, LCSW Phone Number: 12/08/2023, 1:51 PM  Clinical Narrative:   Jill Side at [facility] reporting they requested a Letter of agreement from DSS and they never got it, this was requested on the 13th     Expected Discharge Plan:  (TBD) Barriers to Discharge: Continued Medical Work up  Expected Discharge Plan and Services       Living arrangements for the past 2 months: Single Family Home                                       Social Determinants of Health (SDOH) Interventions SDOH Screenings   Food Insecurity: Patient Unable To Answer (09/26/2023)  Housing: High Risk (09/26/2023)  Transportation Needs: Patient Unable To Answer (09/26/2023)  Utilities: Patient Unable To Answer (09/26/2023)  Tobacco Use: Low Risk  (10/12/2023)    Readmission Risk Interventions     No data to display

## 2023-12-09 DIAGNOSIS — F03918 Unspecified dementia, unspecified severity, with other behavioral disturbance: Secondary | ICD-10-CM | POA: Diagnosis not present

## 2023-12-09 MED ORDER — HALOPERIDOL LACTATE 5 MG/ML IJ SOLN: 2.5000 mg | Freq: Once | INTRAMUSCULAR | Status: DC

## 2023-12-09 MED ORDER — AMLODIPINE BESYLATE 5 MG PO TABS
2.5000 mg | ORAL_TABLET | Freq: Every day | ORAL | Status: DC
  Administered 2023-12-09 – 2023-12-24 (×16): 2.5 mg via ORAL
  Filled 2023-12-09 (×16): qty 1

## 2023-12-09 MED ORDER — HALOPERIDOL LACTATE 2 MG/ML PO CONC
2.5000 mg | Freq: Once | ORAL | Status: AC
  Administered 2023-12-09: 2.5 mg via ORAL
  Filled 2023-12-09: qty 1.25

## 2023-12-09 NOTE — Progress Notes (Signed)
Progress Note    Allison [name]  UJW:[id number] DOB: Dec 13, 1932  DOA: 09/25/2023 PCP: Alan Mulder, MD      Brief Narrative:    Medical records reviewed and are as summarized below:  Allison [name] is a 89 y.o. female with medical history significant for dementia, hypertension, previously admitted in [place] facility, now on home hospice, was sent in by hospice by EMS to the hospital.  APS is involved in her case.  There were concerns for poor oral intake.  She was found to have acute UTI.  E. coli and Pseudomonas were isolated.  She completed ceftriaxone and ciprofloxacin on October 05, 2023.  On 12/03/2023, patient had a choking spell while eating lunch.  Diet was changed to dysphagia 1 diet because of concerns for high risk for aspiration.    Assessment/Plan:   Principal Problem:   Diarrhea Active Problems:   Acute metabolic encephalopathy   Urinary tract infection   Dementia with behavioral disturbance (HCC)   Hypokalemia   Sinus bradycardia   Inadequate oral intake   Pressure injury of skin   Protein-calorie malnutrition, severe   Underweight (BMI < 18.5)   Essential hypertension   Nutrition Problem: Severe Malnutrition Etiology: social / environmental circumstances  Signs/Symptoms: severe fat depletion, severe muscle depletion, percent weight loss Percent weight loss: 25 %   Body mass index is 16.05 kg/m.  (Underweight)   Acute metabolic encephalopathy on underlying dementia: She still has intermittent episodes of agitation.  Continue Aricept and Prozac   Acute UTI: Urine culture had shown E. coli and Pseudomonas and she completed ceftriaxone and ciprofloxacin on October 05, 2023. She was given 1 dose of fosfomycin on 12/07/2023 because of positive nitrates on urinalysis obtained on 12/06/2023.   Hypokalemia: Improved   Intermittent sinus bradycardia: Heart rate mostly within normal limits with occasional bradycardia into the 50s.   Continue to monitor.   Diarrhea: Resolved   Comorbidities include severe protein calorie malnutrition, underweight, hypertension   Diet Order             DIET - DYS 1 Room service appropriate? No; Fluid consistency: Thin  Diet effective now                            Consultants: None  Procedures: None    Medications:    amLODipine  2.5 mg Oral Daily   atenolol  25 mg Oral Daily   donepezil  10 mg Oral Daily   feeding supplement  237 mL Oral BID BM   FLUoxetine  10 mg Oral Daily   levothyroxine  100 mcg Oral Q0600   multivitamin with minerals  1 tablet Oral Daily   Continuous Infusions:   Anti-infectives (From admission, onward)    Start     Dose/Rate Route Frequency Ordered Stop   12/07/23 1630  fosfomycin (MONUROL) packet 3 g        3 g Oral  Once 12/07/23 1527 12/07/23 1707   10/02/23 2000  ciprofloxacin (CIPRO) tablet 500 mg        500 mg Oral 2 times daily 10/02/23 1258 10/05/23 0759   10/01/23 2000  ciprofloxacin (CIPRO) tablet 500 mg  Status:  Discontinued        500 mg Oral 2 times daily 10/01/23 1119 10/02/23 1258   09/28/23 1315  ciprofloxacin (CIPRO) tablet 500 mg  Status:  Discontinued        500  mg Oral 2 times daily 09/28/23 1224 10/01/23 1119   09/26/23 2000  cefTRIAXone (ROCEPHIN) 1 g in sodium chloride 0.9 % 100 mL IVPB  Status:  Discontinued        1 g 200 mL/hr over 30 Minutes Intravenous Every 24 hours 09/25/23 2228 09/28/23 1225   09/25/23 2130  cefTRIAXone (ROCEPHIN) 1 g in sodium chloride 0.9 % 100 mL IVPB        1 g 200 mL/hr over 30 Minutes Intravenous  Once 09/25/23 2121 09/25/23 2210              Family Communication/Anticipated D/C date and plan/Code Status   DVT prophylaxis:      Code Status: Do not attempt resuscitation (DNR) PRE-ARREST INTERVENTIONS DESIRED  Family Communication: None Disposition Plan: Plan to discharge to long-term care facility   Status is: Inpatient Remains inpatient  appropriate because: Awaiting placement       Subjective:   Interval events noted.  Patient had some agitation last night.  She is unable to provide any history.  Objective:    Vitals:   12/08/23 1518 12/08/23 1520 12/09/23 0447 12/09/23 0758  BP: 131/70  (!) 176/84 (!) 162/76  Pulse: 95  73 (!) 57  Resp: 16  16 18   Temp: 99.1 F (37.3 C)  98 F (36.7 C) (!) 97.5 F (36.4 C)  TempSrc:      SpO2: (!) 62% 92% 92% 99%  Weight:      Height:       No data found.   Intake/Output Summary (Last 24 hours) at 12/09/2023 1204 Last data filed at 12/09/2023 1033 Gross per 24 hour  Intake 120 ml  Output --  Net 120 ml   Filed Weights   11/23/23 0500 12/03/23 0500 12/06/23 0500  Weight: 48.2 kg 51.8 kg 45.1 kg    Exam:  GEN: NAD SKIN: Warm and dry EYES: No pallor or icterus ENT: MMM CV: RRR PULM: CTA B ABD: soft, ND, NT, +BS CNS: Sleepy but arousable, confused EXT: No edema or tenderness       Data Reviewed:   I have personally reviewed following labs and imaging studies:  Labs: Labs show the following:   Basic Metabolic Panel: Recent Labs  Lab 12/04/23 0429  NA 138  K 4.4  CL 104  CO2 27  GLUCOSE 94  BUN 38*  CREATININE 0.87  CALCIUM 8.6*   GFR Estimated Creatinine Clearance: 31.2 mL/min (by C-G formula based on SCr of 0.87 mg/dL). Liver Function Tests: No results for input(s): "AST", "ALT", "ALKPHOS", "BILITOT", "PROT", "ALBUMIN" in the last 168 hours. No results for input(s): "LIPASE", "AMYLASE" in the last 168 hours. No results for input(s): "AMMONIA" in the last 168 hours. Coagulation profile No results for input(s): "INR", "PROTIME" in the last 168 hours.  CBC: Recent Labs  Lab 12/04/23 0429  WBC 7.9  HGB 12.0  HCT 35.6*  MCV 99.2  PLT 244   Cardiac Enzymes: No results for input(s): "CKTOTAL", "CKMB", "CKMBINDEX", "TROPONINI" in the last 168 hours. BNP (last 3 results) No results for input(s): "PROBNP" in the last 8760  hours. CBG: No results for input(s): "GLUCAP" in the last 168 hours. D-Dimer: No results for input(s): "DDIMER" in the last 72 hours. Hgb A1c: No results for input(s): "HGBA1C" in the last 72 hours. Lipid Profile: No results for input(s): "CHOL", "HDL", "LDLCALC", "TRIG", "CHOLHDL", "LDLDIRECT" in the last 72 hours. Thyroid function studies: No results for input(s): "TSH", "T4TOTAL", "T3FREE", "THYROIDAB" in  the last 72 hours.  Invalid input(s): "FREET3" Anemia work up: No results for input(s): "VITAMINB12", "FOLATE", "FERRITIN", "TIBC", "IRON", "RETICCTPCT" in the last 72 hours. Sepsis Labs: Recent Labs  Lab 12/04/23 0429  WBC 7.9    Microbiology No results found for this or any previous visit (from the past 240 hours).  Procedures and diagnostic studies:  No results found.             LOS: 74 days   Devonn Giampietro  [employer] loss adjuster on [email or web address]. If 7PM-7AM, please contact night-coverage at [email or web address]     12/09/2023, 12:04 PM

## 2023-12-09 NOTE — Care Management Important Message (Signed)
Important Message  Patient Details  Name: Allison [name] MRN: [id number] Date of Birth: 05-30-1932   Important Message Given:  Yes - Medicare IM     Laurisa Sahakian, Stephan Minister 12/09/2023, 2:26 PM

## 2023-12-10 DIAGNOSIS — F03918 Unspecified dementia, unspecified severity, with other behavioral disturbance: Secondary | ICD-10-CM | POA: Diagnosis not present

## 2023-12-10 MED ORDER — HALOPERIDOL LACTATE 5 MG/ML IJ SOLN: 2.0000 mg | Freq: Once | INTRAMUSCULAR | Status: DC

## 2023-12-10 MED ORDER — QUETIAPINE FUMARATE 25 MG PO TABS
25.0000 mg | ORAL_TABLET | Freq: Every day | ORAL | Status: DC
  Administered 2023-12-10 – 2023-12-12 (×3): 25 mg via ORAL
  Filled 2023-12-10 (×3): qty 1

## 2023-12-10 MED ORDER — HALOPERIDOL LACTATE 2 MG/ML PO CONC
2.5000 mg | Freq: Once | ORAL | Status: DC
  Filled 2023-12-10: qty 5

## 2023-12-10 NOTE — Plan of Care (Signed)
Pt has been incredibly agitated this shift. PRN Haldol 1 mg was given with no affect.  We walked pt and brought her out to the nurse's station. Where she tried to get out of the recliner.  Reached out to the on call NP and she gave another order for 2.5 mg haldol.  Pt calmed down for a while and slept, and then she was back up again.  Gave her a bath, hoping to calm her down.  Haldol seems not to be effective and something is needed to help her sleep at night.

## 2023-12-10 NOTE — Progress Notes (Signed)
Progress Note    Allison [name]  RUE:[id number] DOB: 02-Jan-1932  DOA: 09/25/2023 PCP: Alan Mulder, MD      Brief Narrative:    Medical records reviewed and are as summarized below:  Allison [name] is a 89 y.o. female with medical history significant for dementia, hypertension, previously admitted in [place] facility, now on home hospice, was sent in by hospice by EMS to the hospital.  APS is involved in her case.  There were concerns for poor oral intake.  She was found to have acute UTI.  E. coli and Pseudomonas were isolated.  She completed ceftriaxone and ciprofloxacin on October 05, 2023.  On 12/03/2023, patient had a choking spell while eating lunch.  Diet was changed to dysphagia 1 diet because of concerns for high risk for aspiration.    Assessment/Plan:   Principal Problem:   Diarrhea Active Problems:   Acute metabolic encephalopathy   Urinary tract infection   Dementia with behavioral disturbance (HCC)   Hypokalemia   Sinus bradycardia   Inadequate oral intake   Pressure injury of skin   Protein-calorie malnutrition, severe   Underweight (BMI < 18.5)   Essential hypertension   Nutrition Problem: Severe Malnutrition Etiology: social / environmental circumstances  Signs/Symptoms: severe fat depletion, severe muscle depletion, percent weight loss Percent weight loss: 25 %   Body mass index is 16.05 kg/m.  (Underweight)   Acute metabolic encephalopathy, delirium on underlying dementia: Seroquel 25 mg nightly has been ordered for recurrent agitation.  Use Haldol injection as needed for agitation.  Continue Aricept and Prozac   Acute UTI: Urine culture had shown E. coli and Pseudomonas and she completed ceftriaxone and ciprofloxacin on October 05, 2023. She was given 1 dose of fosfomycin on 12/07/2023 because of positive nitrates on urinalysis obtained on 12/06/2023.   Hypokalemia: Improved   Intermittent sinus bradycardia: Heart rate  mostly within normal limits with occasional bradycardia into the 50s.  Continue to monitor.   Diarrhea: Resolved   Comorbidities include severe protein calorie malnutrition, underweight, hypertension   Diet Order             DIET - DYS 1 Room service appropriate? No; Fluid consistency: Thin  Diet effective now                            Consultants: None  Procedures: None    Medications:    amLODipine  2.5 mg Oral Daily   atenolol  25 mg Oral Daily   donepezil  10 mg Oral Daily   feeding supplement  237 mL Oral BID BM   FLUoxetine  10 mg Oral Daily   haloperidol lactate  2 mg Intramuscular Once   levothyroxine  100 mcg Oral Q0600   multivitamin with minerals  1 tablet Oral Daily   QUEtiapine  25 mg Oral QHS   Continuous Infusions:   Anti-infectives (From admission, onward)    Start     Dose/Rate Route Frequency Ordered Stop   12/07/23 1630  fosfomycin (MONUROL) packet 3 g        3 g Oral  Once 12/07/23 1527 12/07/23 1707   10/02/23 2000  ciprofloxacin (CIPRO) tablet 500 mg        500 mg Oral 2 times daily 10/02/23 1258 10/05/23 0759   10/01/23 2000  ciprofloxacin (CIPRO) tablet 500 mg  Status:  Discontinued        500 mg Oral  2 times daily 10/01/23 1119 10/02/23 1258   09/28/23 1315  ciprofloxacin (CIPRO) tablet 500 mg  Status:  Discontinued        500 mg Oral 2 times daily 09/28/23 1224 10/01/23 1119   09/26/23 2000  cefTRIAXone (ROCEPHIN) 1 g in sodium chloride 0.9 % 100 mL IVPB  Status:  Discontinued        1 g 200 mL/hr over 30 Minutes Intravenous Every 24 hours 09/25/23 2228 09/28/23 1225   09/25/23 2130  cefTRIAXone (ROCEPHIN) 1 g in sodium chloride 0.9 % 100 mL IVPB        1 g 200 mL/hr over 30 Minutes Intravenous  Once 09/25/23 2121 09/25/23 2210              Family Communication/Anticipated D/C date and plan/Code Status   DVT prophylaxis:      Code Status: Do not attempt resuscitation (DNR) PRE-ARREST INTERVENTIONS  DESIRED  Family Communication: None Disposition Plan: Plan to discharge to long-term care facility   Status is: Inpatient Remains inpatient appropriate because: Awaiting placement       Subjective:   Interval events noted.  Patient has been intermittently agitated and required Haldol to calm her down.  Objective:    Vitals:   12/09/23 0758 12/09/23 1824 12/09/23 2038 12/10/23 0740  BP: (!) 162/76 (!) 144/83 121/89 (!) 170/73  Pulse: (!) 57 83 89 70  Resp: 18 16 18    Temp: (!) 97.5 F (36.4 C) 98.6 F (37 C) (!) 97.5 F (36.4 C)   TempSrc:   Oral   SpO2: 99% 98% 98% 98%  Weight:      Height:       No data found.  No intake or output data in the 24 hours ending 12/10/23 1431  Filed Weights   11/23/23 0500 12/03/23 0500 12/06/23 0500  Weight: 48.2 kg 51.8 kg 45.1 kg    Exam:  GEN: NAD SKIN: Warm and dry EYES: No pallor or icterus ENT: MMM CV: RRR PULM: CTA B ABD: soft, ND, NT, +BS CNS: AAO x 1 (person), non focal EXT: No edema or tenderness PSYCH: Calm and cooperative at the time of my visit      Data Reviewed:   I have personally reviewed following labs and imaging studies:  Labs: Labs show the following:   Basic Metabolic Panel: Recent Labs  Lab 12/04/23 0429  NA 138  K 4.4  CL 104  CO2 27  GLUCOSE 94  BUN 38*  CREATININE 0.87  CALCIUM 8.6*   GFR Estimated Creatinine Clearance: 31.2 mL/min (by C-G formula based on SCr of 0.87 mg/dL). Liver Function Tests: No results for input(s): "AST", "ALT", "ALKPHOS", "BILITOT", "PROT", "ALBUMIN" in the last 168 hours. No results for input(s): "LIPASE", "AMYLASE" in the last 168 hours. No results for input(s): "AMMONIA" in the last 168 hours. Coagulation profile No results for input(s): "INR", "PROTIME" in the last 168 hours.  CBC: Recent Labs  Lab 12/04/23 0429  WBC 7.9  HGB 12.0  HCT 35.6*  MCV 99.2  PLT 244   Cardiac Enzymes: No results for input(s): "CKTOTAL", "CKMB",  "CKMBINDEX", "TROPONINI" in the last 168 hours. BNP (last 3 results) No results for input(s): "PROBNP" in the last 8760 hours. CBG: No results for input(s): "GLUCAP" in the last 168 hours. D-Dimer: No results for input(s): "DDIMER" in the last 72 hours. Hgb A1c: No results for input(s): "HGBA1C" in the last 72 hours. Lipid Profile: No results for input(s): "CHOL", "HDL", "LDLCALC", "  TRIG", "CHOLHDL", "LDLDIRECT" in the last 72 hours. Thyroid function studies: No results for input(s): "TSH", "T4TOTAL", "T3FREE", "THYROIDAB" in the last 72 hours.  Invalid input(s): "FREET3" Anemia work up: No results for input(s): "VITAMINB12", "FOLATE", "FERRITIN", "TIBC", "IRON", "RETICCTPCT" in the last 72 hours. Sepsis Labs: Recent Labs  Lab 12/04/23 0429  WBC 7.9    Microbiology No results found for this or any previous visit (from the past 240 hours).  Procedures and diagnostic studies:  No results found.             LOS: 75 days   Allison [name]  [employer] loss adjuster on [email or web address]. If 7PM-7AM, please contact night-coverage at [email or web address]     12/10/2023, 2:31 PM

## 2023-12-11 NOTE — Progress Notes (Signed)
Progress Note    Allison [name]  EAV:[id number] DOB: May 11, 1932  DOA: 09/25/2023 PCP: Alan Mulder, MD      Brief Narrative:    Medical records reviewed and are as summarized below:  Allison [name] is a 89 y.o. female with medical history significant for dementia, hypertension, previously admitted in [place] facility, now on home hospice, was sent in by hospice by EMS to the hospital.  APS is involved in her case.  There were concerns for poor oral intake.  She was found to have acute UTI.  E. coli and Pseudomonas were isolated.  She completed ceftriaxone and ciprofloxacin on October 05, 2023.  On 12/03/2023, patient had a choking spell while eating lunch.  Diet was changed to dysphagia 1 diet because of concerns for high risk for aspiration. She is currently awaiting placement back at her facility.     Assessment/Plan:   Principal Problem:   Diarrhea Active Problems:   Acute metabolic encephalopathy   Urinary tract infection   Dementia with behavioral disturbance (HCC)   Hypokalemia   Sinus bradycardia   Inadequate oral intake   Pressure injury of skin   Protein-calorie malnutrition, severe   Underweight (BMI < 18.5)   Essential hypertension   Nutrition Problem: Severe Malnutrition Etiology: social / environmental circumstances  Signs/Symptoms: severe fat depletion, severe muscle depletion, percent weight loss Percent weight loss: 25 %   Body mass index is 16.76 kg/m.  (Underweight)   Acute metabolic encephalopathy, delirium on underlying dementia:  Patient was without mitts this AM.  -Continue Seroquel 25 mg nightly for recurrent agitation.   -Continue Aricept and Prozac -Haldol as needed for breakthrough agitation    Acute UTI:  Urine culture with E. coli and Pseudomonas and she is s/p ceftriaxone and ciprofloxacin on October 05, 2023. She was given 1 dose of fosfomycin on 12/07/2023 because of positive nitrates on urinalysis obtained on  12/06/2023.   Intermittent sinus bradycardia: Heart rate mostly within normal limits with occasional bradycardia into the 50s.  Continue to monitor.   Diarrhea: Resolved   Co-morbidities: include severe protein calorie malnutrition, underweight, hypertension   Diet Order             DIET - DYS 1 Room service appropriate? No; Fluid consistency: Thin  Diet effective now                   Consultants: None  Procedures: None    Medications:    amLODipine  2.5 mg Oral Daily   atenolol  25 mg Oral Daily   donepezil  10 mg Oral Daily   feeding supplement  237 mL Oral BID BM   FLUoxetine  10 mg Oral Daily   haloperidol lactate  2 mg Intramuscular Once   levothyroxine  100 mcg Oral Q0600   multivitamin with minerals  1 tablet Oral Daily   QUEtiapine  25 mg Oral QHS   Continuous Infusions:   Anti-infectives (From admission, onward)    Start     Dose/Rate Route Frequency Ordered Stop   12/07/23 1630  fosfomycin (MONUROL) packet 3 g        3 g Oral  Once 12/07/23 1527 12/07/23 1707   10/02/23 2000  ciprofloxacin (CIPRO) tablet 500 mg        500 mg Oral 2 times daily 10/02/23 1258 10/05/23 0759   10/01/23 2000  ciprofloxacin (CIPRO) tablet 500 mg  Status:  Discontinued  500 mg Oral 2 times daily 10/01/23 1119 10/02/23 1258   09/28/23 1315  ciprofloxacin (CIPRO) tablet 500 mg  Status:  Discontinued        500 mg Oral 2 times daily 09/28/23 1224 10/01/23 1119   09/26/23 2000  cefTRIAXone (ROCEPHIN) 1 g in sodium chloride 0.9 % 100 mL IVPB  Status:  Discontinued        1 g 200 mL/hr over 30 Minutes Intravenous Every 24 hours 09/25/23 2228 09/28/23 1225   09/25/23 2130  cefTRIAXone (ROCEPHIN) 1 g in sodium chloride 0.9 % 100 mL IVPB        1 g 200 mL/hr over 30 Minutes Intravenous  Once 09/25/23 2121 09/25/23 2210              Family Communication/Anticipated D/C date and plan/Code Status   DVT prophylaxis:      Code Status: Do not attempt  resuscitation (DNR) PRE-ARREST INTERVENTIONS DESIRED  Family Communication: None Disposition Plan: Plan to discharge to long-term care facility  Status is: Inpatient Remains inpatient appropriate because: Awaiting placement   Subjective:   No interval events noted. Patient not requiring mitts or haldol ON. She has no complaints of pain this AM.   She is awaiting placement back at her facility.   Objective:    Vitals:   12/10/23 2139 12/11/23 0552 12/11/23 0948 12/11/23 0949  BP: (!) 145/68 (!) 151/62 (!) 148/65 (!) 148/65  Pulse: 62 60 62   Resp: 16 16 18    Temp: 97.7 F (36.5 C) 98.1 F (36.7 C) (!) 97.5 F (36.4 C)   TempSrc: Oral Axillary    SpO2: 99% 98% 99%   Weight:  47.1 kg    Height:       No data found.   Intake/Output Summary (Last 24 hours) at 12/11/2023 1425 Last data filed at 12/10/2023 2045 Gross per 24 hour  Intake 120 ml  Output --  Net 120 ml    Filed Weights   12/03/23 0500 12/06/23 0500 12/11/23 0552  Weight: 51.8 kg 45.1 kg 47.1 kg    Exam:  GEN: NAD SKIN: Warm and dry EYES: No pallor or icterus ENT: MMM CV: RRR PULM: CTA B ABD: soft, ND, NT, +BS CNS: AAO x 1 (person), non focal EXT: No edema or tenderness PSYCH: Calm and cooperative at the time of my visit  Constitutional: Well-developed, well-nourished, and in no distress.   Cardiovascular: Normal rate, regular rhythm, intact distal pulses. No lower extremity edema  Pulmonary: Non labored breathing on room air, no wheezing or rales  Abdominal: Soft. Normal bowel sounds. Non distended and non tender Neurological: Alert and oriented to person only. Non focal  Skin: Skin is warm and dry.    Data Reviewed:   I have personally reviewed following labs and imaging studies:  Labs: Labs show the following:   Basic Metabolic Panel: No results for input(s): "NA", "K", "CL", "CO2", "GLUCOSE", "BUN", "CREATININE", "CALCIUM", "MG", "PHOS" in the last 168 hours.  GFR Estimated  Creatinine Clearance: 32.6 mL/min (by C-G formula based on SCr of 0.87 mg/dL). Liver Function Tests: No results for input(s): "AST", "ALT", "ALKPHOS", "BILITOT", "PROT", "ALBUMIN" in the last 168 hours. No results for input(s): "LIPASE", "AMYLASE" in the last 168 hours. No results for input(s): "AMMONIA" in the last 168 hours. Coagulation profile No results for input(s): "INR", "PROTIME" in the last 168 hours.  CBC: No results for input(s): "WBC", "NEUTROABS", "HGB", "HCT", "MCV", "PLT" in the last 168 hours.  Cardiac Enzymes: No results for input(s): "CKTOTAL", "CKMB", "CKMBINDEX", "TROPONINI" in the last 168 hours. BNP (last 3 results) No results for input(s): "PROBNP" in the last 8760 hours. CBG: No results for input(s): "GLUCAP" in the last 168 hours. D-Dimer: No results for input(s): "DDIMER" in the last 72 hours. Hgb A1c: No results for input(s): "HGBA1C" in the last 72 hours. Lipid Profile: No results for input(s): "CHOL", "HDL", "LDLCALC", "TRIG", "CHOLHDL", "LDLDIRECT" in the last 72 hours. Thyroid function studies: No results for input(s): "TSH", "T4TOTAL", "T3FREE", "THYROIDAB" in the last 72 hours.  Invalid input(s): "FREET3" Anemia work up: No results for input(s): "VITAMINB12", "FOLATE", "FERRITIN", "TIBC", "IRON", "RETICCTPCT" in the last 72 hours. Sepsis Labs: No results for input(s): "PROCALCITON", "WBC", "LATICACIDVEN" in the last 168 hours.   Microbiology No results found for this or any previous visit (from the past 240 hours).  Procedures and diagnostic studies:  No results found.    LOS: 76 days   Marolyn Haller MD  [facility]   Pager on [email or web address]. If 7PM-7AM, please contact night-coverage at [email or web address]  12/11/2023, 2:25 PM

## 2023-12-11 NOTE — Plan of Care (Signed)

## 2023-12-11 NOTE — Plan of Care (Signed)
  Problem: Clinical Measurements: Goal: Ability to maintain clinical measurements within normal limits will improve Outcome: Progressing Goal: Will remain free from infection Outcome: Progressing   Problem: Activity: Goal: Risk for activity intolerance will decrease Outcome: Progressing   Problem: Nutrition: Goal: Adequate nutrition will be maintained Outcome: Progressing   Problem: Coping: Goal: Level of anxiety will decrease Outcome: Progressing   Problem: Elimination: Goal: Will not experience complications related to bowel motility Outcome: Progressing Goal: Will not experience complications related to urinary retention Outcome: Progressing   Problem: Skin Integrity: Goal: Risk for impaired skin integrity will decrease Outcome: Progressing

## 2023-12-11 NOTE — Plan of Care (Signed)
  Problem: Education: Goal: Knowledge of General Education information will improve Description: Including pain rating scale, medication(s)/side effects and non-pharmacologic comfort measures Outcome: Progressing   Problem: Clinical Measurements: Goal: Ability to maintain clinical measurements within normal limits will improve Outcome: Progressing   Problem: Activity: Goal: Risk for activity intolerance will decrease Outcome: Progressing   Problem: Nutrition: Goal: Adequate nutrition will be maintained Outcome: Progressing   Problem: Coping: Goal: Level of anxiety will decrease Outcome: Progressing   Problem: Elimination: Goal: Will not experience complications related to bowel motility Outcome: Progressing   Problem: Pain Management: Goal: General experience of comfort will improve Outcome: Progressing   Problem: Safety: Goal: Ability to remain free from injury will improve Outcome: Progressing   Problem: Skin Integrity: Goal: Risk for impaired skin integrity will decrease Outcome: Progressing

## 2023-12-12 NOTE — Progress Notes (Signed)
Progress Note    Allison [name]  ZOX:[id number] DOB: 02/17/1932  DOA: 09/25/2023 PCP: Alan Mulder, MD      Brief Narrative:    Medical records reviewed and are as summarized below:  Allison [name] is a 89 y.o. female with medical history significant for dementia, hypertension, previously admitted in [place] facility, now on home hospice, was sent in by hospice by EMS to the hospital.  APS is involved in her case.  There were concerns for poor oral intake.  She was found to have acute UTI.  E. coli and Pseudomonas were isolated.  She completed ceftriaxone and ciprofloxacin on October 05, 2023.  On 12/03/2023, patient had a choking spell while eating lunch.  Diet was changed to dysphagia 1 diet because of concerns for high risk for aspiration. She is currently awaiting placement back at her facility. Medically stable for discharge    Assessment/Plan:   Principal Problem:   Diarrhea Active Problems:   Metabolic encephalopathy   Urinary tract infection   Dementia with behavioral disturbance (HCC)   Hypokalemia   Sinus bradycardia   Inadequate oral intake   Pressure injury of skin   Protein-calorie malnutrition, severe   Underweight (BMI < 18.5)   Essential hypertension   Nutrition Problem: Severe Malnutrition Etiology: social / environmental circumstances  Signs/Symptoms: severe fat depletion, severe muscle depletion, percent weight loss Percent weight loss: 25 %   Body mass index is 16.76 kg/m.  (Underweight)   Acute metabolic encephalopathy resolved Delirium Dementia Patient was without mitts this AM.  -Continue Seroquel 25 mg nightly for recurrent agitation.   -Continue Aricept and Prozac -Haldol as needed for breakthrough agitation    Acute UTI: Resolved   Urine culture with E. coli and Pseudomonas and she is s/p ceftriaxone and ciprofloxacin on October 05, 2023. She was given 1 dose of fosfomycin on 12/07/2023 because of positive nitrates  on urinalysis obtained on 12/06/2023.   Intermittent sinus bradycardia: Heart rate mostly within normal limits. Mostly in the 80s ON and this AM. Continue to monitor. -Consider stopping aricept/beta blocker   Diarrhea: Resolved   Co-morbidities: include severe protein calorie malnutrition, underweight, hypertension   Diet Order             DIET - DYS 1 Room service appropriate? No; Fluid consistency: Thin  Diet effective now                   Consultants: None  Procedures: None    Medications:    amLODipine  2.5 mg Oral Daily   atenolol  25 mg Oral Daily   donepezil  10 mg Oral Daily   feeding supplement  237 mL Oral BID BM   FLUoxetine  10 mg Oral Daily   haloperidol lactate  2 mg Intramuscular Once   levothyroxine  100 mcg Oral Q0600   multivitamin with minerals  1 tablet Oral Daily   QUEtiapine  25 mg Oral QHS   Continuous Infusions:   Anti-infectives (From admission, onward)    Start     Dose/Rate Route Frequency Ordered Stop   12/07/23 1630  fosfomycin (MONUROL) packet 3 g        3 g Oral  Once 12/07/23 1527 12/07/23 1707   10/02/23 2000  ciprofloxacin (CIPRO) tablet 500 mg        500 mg Oral 2 times daily 10/02/23 1258 10/05/23 0759   10/01/23 2000  ciprofloxacin (CIPRO) tablet 500 mg  Status:  Discontinued        500 mg Oral 2 times daily 10/01/23 1119 10/02/23 1258   09/28/23 1315  ciprofloxacin (CIPRO) tablet 500 mg  Status:  Discontinued        500 mg Oral 2 times daily 09/28/23 1224 10/01/23 1119   09/26/23 2000  cefTRIAXone (ROCEPHIN) 1 g in sodium chloride 0.9 % 100 mL IVPB  Status:  Discontinued        1 g 200 mL/hr over 30 Minutes Intravenous Every 24 hours 09/25/23 2228 09/28/23 1225   09/25/23 2130  cefTRIAXone (ROCEPHIN) 1 g in sodium chloride 0.9 % 100 mL IVPB        1 g 200 mL/hr over 30 Minutes Intravenous  Once 09/25/23 2121 09/25/23 2210              Family Communication/Anticipated D/C date and plan/Code Status   DVT  prophylaxis:      Code Status: Do not attempt resuscitation (DNR) PRE-ARREST INTERVENTIONS DESIRED  Family Communication: None Disposition Plan: Plan to discharge to long-term care facility  Status is: Inpatient Remains inpatient appropriate because: Awaiting placement   Subjective:   Restless ON requiring x 2 doses. Patient was resting comfortably this AM.   Objective:    Vitals:   12/11/23 2108 12/12/23 0506 12/12/23 0829 12/12/23 1500  BP: 127/62 (!) 145/56 136/79 124/81  Pulse: 79 68 85 80  Resp: 16 18 15 16   Temp: 98.4 F (36.9 C) 98 F (36.7 C) 98.3 F (36.8 C) 98 F (36.7 C)  TempSrc:      SpO2: 95% 96% 97% 100%  Weight:      Height:       No data found.   Intake/Output Summary (Last 24 hours) at 12/12/2023 1849 Last data filed at 12/12/2023 1700 Gross per 24 hour  Intake 200 ml  Output --  Net 200 ml    Filed Weights   12/03/23 0500 12/06/23 0500 12/11/23 0552  Weight: 51.8 kg 45.1 kg 47.1 kg    Exam:  Physical Exam  Constitutional: thin, In no distress. Resting comfortably Cardiovascular: Normal rate, regular rhythm. No lower extremity edema  Pulmonary: Non labored breathing on room air, no wheezing or rales.  Abdominal: Soft. Normal bowel sounds. Non distended Musculoskeletal: Normal range of motion.     Neurological: Drowsy. Non focal  Skin: Skin is warm and dry.     Data Reviewed:   I have personally reviewed following labs and imaging studies:  Labs: Labs show the following:   Basic Metabolic Panel: No results for input(s): "NA", "K", "CL", "CO2", "GLUCOSE", "BUN", "CREATININE", "CALCIUM", "MG", "PHOS" in the last 168 hours.  GFR Estimated Creatinine Clearance: 32.6 mL/min (by C-G formula based on SCr of 0.87 mg/dL). Liver Function Tests: No results for input(s): "AST", "ALT", "ALKPHOS", "BILITOT", "PROT", "ALBUMIN" in the last 168 hours. No results for input(s): "LIPASE", "AMYLASE" in the last 168 hours. No results for  input(s): "AMMONIA" in the last 168 hours. Coagulation profile No results for input(s): "INR", "PROTIME" in the last 168 hours.  CBC: No results for input(s): "WBC", "NEUTROABS", "HGB", "HCT", "MCV", "PLT" in the last 168 hours.  Cardiac Enzymes: No results for input(s): "CKTOTAL", "CKMB", "CKMBINDEX", "TROPONINI" in the last 168 hours. BNP (last 3 results) No results for input(s): "PROBNP" in the last 8760 hours. CBG: No results for input(s): "GLUCAP" in the last 168 hours. D-Dimer: No results for input(s): "DDIMER" in the last 72 hours. Hgb A1c: No results for input(s): "  HGBA1C" in the last 72 hours. Lipid Profile: No results for input(s): "CHOL", "HDL", "LDLCALC", "TRIG", "CHOLHDL", "LDLDIRECT" in the last 72 hours. Thyroid function studies: No results for input(s): "TSH", "T4TOTAL", "T3FREE", "THYROIDAB" in the last 72 hours.  Invalid input(s): "FREET3" Anemia work up: No results for input(s): "VITAMINB12", "FOLATE", "FERRITIN", "TIBC", "IRON", "RETICCTPCT" in the last 72 hours. Sepsis Labs: No results for input(s): "PROCALCITON", "WBC", "LATICACIDVEN" in the last 168 hours.   Microbiology No results found for this or any previous visit (from the past 240 hours).  Procedures and diagnostic studies:  No results found.    LOS: 77 days   Marolyn Haller MD  [facility]   Pager on [email or web address]. If 7PM-7AM, please contact night-coverage at [email or web address]  12/12/2023, 6:49 PM

## 2023-12-12 NOTE — Progress Notes (Signed)
Patient frequently trying to get out the bed and is restless.prn haldol given

## 2023-12-12 NOTE — Plan of Care (Signed)
  Problem: Education: Goal: Knowledge of General Education information will improve Description: Including pain rating scale, medication(s)/side effects and non-pharmacologic comfort measures Outcome: Progressing   Problem: Clinical Measurements: Goal: Ability to maintain clinical measurements within normal limits will improve Outcome: Progressing Goal: Will remain free from infection Outcome: Progressing Goal: Diagnostic test results will improve Outcome: Progressing Goal: Respiratory complications will improve Outcome: Progressing Goal: Cardiovascular complication will be avoided Outcome: Progressing   Problem: Activity: Goal: Risk for activity intolerance will decrease Outcome: Progressing   Problem: Pain Management: Goal: General experience of comfort will improve Outcome: Progressing   Problem: Safety: Goal: Ability to remain free from injury will improve Outcome: Progressing

## 2023-12-13 MED ORDER — QUETIAPINE FUMARATE 25 MG PO TABS
12.5000 mg | ORAL_TABLET | Freq: Two times a day (BID) | ORAL | Status: DC
  Administered 2023-12-14 – 2023-12-18 (×9): 12.5 mg via ORAL
  Filled 2023-12-13 (×9): qty 1

## 2023-12-13 MED ORDER — QUETIAPINE FUMARATE 25 MG PO TABS
25.0000 mg | ORAL_TABLET | Freq: Two times a day (BID) | ORAL | Status: DC
  Administered 2023-12-13 – 2023-12-17 (×9): 25 mg via ORAL
  Filled 2023-12-13 (×11): qty 1

## 2023-12-13 MED ORDER — HALOPERIDOL LACTATE 5 MG/ML IJ SOLN
1.0000 mg | Freq: Once | INTRAMUSCULAR | Status: AC
  Administered 2023-12-13: 1 mg via INTRAMUSCULAR
  Filled 2023-12-13: qty 1

## 2023-12-13 NOTE — Progress Notes (Signed)
Progress Note    [name]  LKG:[id number] DOB: 03-16-1932  DOA: 09/25/2023 PCP: Alan Mulder, MD      Brief Narrative:    Medical records reviewed and are as summarized below:  Allison [name] is a 89 y.o. female with medical history significant for dementia, hypertension, previously admitted in [facility] facility, now on home hospice, was sent in by hospice by EMS to the hospital.  APS is involved in her case.  There were concerns for poor oral intake.  She was found to have acute UTI.  E. coli and Pseudomonas were isolated.  She completed ceftriaxone and ciprofloxacin on October 05, 2023.  On 12/03/2023, patient had a choking spell while eating lunch.  Diet was changed to dysphagia 1 diet because of concerns for high risk for aspiration. She is currently awaiting placement back at her facility. Medically stable for discharge    Assessment/Plan:   Principal Problem:   Diarrhea Active Problems:   Metabolic encephalopathy   Urinary tract infection   Dementia with behavioral disturbance (HCC)   Hypokalemia   Sinus bradycardia   Inadequate oral intake   Pressure injury of skin   Protein-calorie malnutrition, severe   Underweight (BMI < 18.5)   Essential hypertension   Nutrition Problem: Severe Malnutrition Etiology: social / environmental circumstances  Signs/Symptoms: severe fat depletion, severe muscle depletion, percent weight loss Percent weight loss: 25 %   Body mass index is 16.69 kg/m.  (Underweight)   Acute metabolic encephalopathy resolved Delirium Dementia Patient without mitts this AM.  -Continue Seroquel 25 mg nightly for recurrent agitation.   -Continue Aricept and Prozac -Haldol as needed for breakthrough agitation    Acute UTI: Resolved   Urine culture with E. coli and Pseudomonas and she is s/p ceftriaxone and ciprofloxacin on October 05, 2023. She was given 1 dose of fosfomycin on 12/07/2023 because of positive nitrates on  urinalysis obtained on 12/06/2023.   Intermittent sinus bradycardia: Heart rate mostly within normal limits. Mostly in the 64s and 80s.Continue to monitor. -Consider stopping aricept/beta blocker   Diarrhea: Resolved   Co-morbidities: include severe protein calorie malnutrition, underweight, hypertension   Diet Order             DIET - DYS 1 Room service appropriate? No; Fluid consistency: Thin  Diet effective now                   Consultants: None  Procedures: None    Medications:    amLODipine  2.5 mg Oral Daily   atenolol  25 mg Oral Daily   donepezil  10 mg Oral Daily   feeding supplement  237 mL Oral BID BM   FLUoxetine  10 mg Oral Daily   levothyroxine  100 mcg Oral Q0600   multivitamin with minerals  1 tablet Oral Daily   QUEtiapine  25 mg Oral QHS   Continuous Infusions:   Anti-infectives (From admission, onward)    Start     Dose/Rate Route Frequency Ordered Stop   12/07/23 1630  fosfomycin (MONUROL) packet 3 g        3 g Oral  Once 12/07/23 1527 12/07/23 1707   10/02/23 2000  ciprofloxacin (CIPRO) tablet 500 mg        500 mg Oral 2 times daily 10/02/23 1258 10/05/23 0759   10/01/23 2000  ciprofloxacin (CIPRO) tablet 500 mg  Status:  Discontinued        500 mg Oral 2 times  daily 10/01/23 1119 10/02/23 1258   09/28/23 1315  ciprofloxacin (CIPRO) tablet 500 mg  Status:  Discontinued        500 mg Oral 2 times daily 09/28/23 1224 10/01/23 1119   09/26/23 2000  cefTRIAXone (ROCEPHIN) 1 g in sodium chloride 0.9 % 100 mL IVPB  Status:  Discontinued        1 g 200 mL/hr over 30 Minutes Intravenous Every 24 hours 09/25/23 2228 09/28/23 1225   09/25/23 2130  cefTRIAXone (ROCEPHIN) 1 g in sodium chloride 0.9 % 100 mL IVPB        1 g 200 mL/hr over 30 Minutes Intravenous  Once 09/25/23 2121 09/25/23 2210              Family Communication/Anticipated D/C date and plan/Code Status   DVT prophylaxis:      Code Status: Do not attempt  resuscitation (DNR) PRE-ARREST INTERVENTIONS DESIRED  Family Communication: None Disposition Plan: Plan to discharge to long-term care facility  Status is: Inpatient Remains inpatient appropriate because: Awaiting placement   Subjective:   Required p.o. Haldol this a.m.  Calm this a.m.  Patient eating.  Not complaining of pain, does not give reliable history.  Remains medically stable for discharge awaiting placement Objective:    Vitals:   12/13/23 0645 12/13/23 0815 12/13/23 0906 12/13/23 1532  BP: (!) 165/72 118/83  (!) 156/63  Pulse: (!) 59 74 60 81  Resp:    18  Temp:    (!) 97.5 F (36.4 C)  TempSrc:      SpO2:    98%  Weight:      Height:       No data found.   Intake/Output Summary (Last 24 hours) at 12/13/2023 1612 Last data filed at 12/12/2023 1700 Gross per 24 hour  Intake 200 ml  Output --  Net 200 ml    Filed Weights   12/06/23 0500 12/11/23 0552 12/13/23 0500  Weight: 45.1 kg 47.1 kg 46.9 kg    Exam:  Physical Exam  Constitutional: In no distress.  Thin Cardiovascular: Normal rate, regular rhythm. No lower extremity edema  Pulmonary: Non labored breathing on room air Abdominal: Soft. Normal bowel sounds. Non distended and non tender Musculoskeletal: Normal range of motion.     Neurological: Alert Non focal  Skin: Skin is warm and dry.    Data Reviewed:   I have personally reviewed following labs and imaging studies:  Labs: Labs show the following:   Basic Metabolic Panel: No results for input(s): "NA", "K", "CL", "CO2", "GLUCOSE", "BUN", "CREATININE", "CALCIUM", "MG", "PHOS" in the last 168 hours.  GFR Estimated Creatinine Clearance: 32.5 mL/min (by C-G formula based on SCr of 0.87 mg/dL). Liver Function Tests: No results for input(s): "AST", "ALT", "ALKPHOS", "BILITOT", "PROT", "ALBUMIN" in the last 168 hours. No results for input(s): "LIPASE", "AMYLASE" in the last 168 hours. No results for input(s): "AMMONIA" in the last 168  hours. Coagulation profile No results for input(s): "INR", "PROTIME" in the last 168 hours.  CBC: No results for input(s): "WBC", "NEUTROABS", "HGB", "HCT", "MCV", "PLT" in the last 168 hours.  Cardiac Enzymes: No results for input(s): "CKTOTAL", "CKMB", "CKMBINDEX", "TROPONINI" in the last 168 hours. BNP (last 3 results) No results for input(s): "PROBNP" in the last 8760 hours. CBG: No results for input(s): "GLUCAP" in the last 168 hours. D-Dimer: No results for input(s): "DDIMER" in the last 72 hours. Hgb A1c: No results for input(s): "HGBA1C" in the last 72 hours. Lipid  Profile: No results for input(s): "CHOL", "HDL", "LDLCALC", "TRIG", "CHOLHDL", "LDLDIRECT" in the last 72 hours. Thyroid function studies: No results for input(s): "TSH", "T4TOTAL", "T3FREE", "THYROIDAB" in the last 72 hours.  Invalid input(s): "FREET3" Anemia work up: No results for input(s): "VITAMINB12", "FOLATE", "FERRITIN", "TIBC", "IRON", "RETICCTPCT" in the last 72 hours. Sepsis Labs: No results for input(s): "PROCALCITON", "WBC", "LATICACIDVEN" in the last 168 hours.   Microbiology No results found for this or any previous visit (from the past 240 hours).  Procedures and diagnostic studies:  No results found.    LOS: 78 days   Marolyn Haller MD  [facility]   Pager on [email or web address]. If 7PM-7AM, please contact night-coverage at [email or web address]  12/13/2023, 4:12 PM

## 2023-12-14 NOTE — Progress Notes (Signed)
Progress Note    Allison [name]  EXB:[id number] DOB: [birth year]  DOA: 09/25/2023 PCP: Alan Mulder, MD      Brief Narrative:    Medical records reviewed and are as summarized below:  Allison [name] is a 89 y.o. female with medical history significant for dementia, hypertension, previously admitted in [place] facility, now on home hospice, was sent in by hospice by EMS to the hospital.  APS is involved in her case.  There were concerns for poor oral intake.  She was found to have acute UTI.  E. coli and Pseudomonas were isolated.  She completed ceftriaxone and ciprofloxacin on October 05, 2023.  On 12/03/2023, patient had a choking spell while eating lunch.  Diet was changed to dysphagia 1 diet because of concerns for high risk for aspiration. She is currently awaiting placement back at her facility. Medically stable for discharge    Assessment/Plan:   Principal Problem:   Diarrhea Active Problems:   Metabolic encephalopathy   Urinary tract infection   Dementia with behavioral disturbance (HCC)   Hypokalemia   Sinus bradycardia   Inadequate oral intake   Pressure injury of skin   Protein-calorie malnutrition, severe   Underweight (BMI < 18.5)   Essential hypertension   Nutrition Problem: Severe Malnutrition Etiology: social / environmental circumstances  Signs/Symptoms: severe fat depletion, severe muscle depletion, percent weight loss Percent weight loss: 25 %   Body mass index is 15.62 kg/m.  (Underweight)   Acute metabolic encephalopathy resolved Delirium Dementia Patient without mitts this AM.  -Continue Seroquel 25 mg nightly for recurrent agitation. Will add low dose AM dose for agitation.   -Continue Aricept and Prozac -Haldol as needed for breakthrough agitation    Acute UTI: Resolved   Urine culture with E. coli and Pseudomonas and she is s/p ceftriaxone and ciprofloxacin on October 05, 2023. She was given 1 dose of fosfomycin on  12/07/2023 because of positive nitrates on urinalysis obtained on 12/06/2023.   Intermittent sinus bradycardia: Heart rate mostly within normal limits. Mostly in the 4s and 80s.Continue to monitor. -Consider stopping aricept/beta blocker   Diarrhea: Resolved   Co-morbidities: include severe protein calorie malnutrition, underweight, hypertension   Diet Order             DIET - DYS 1 Room service appropriate? No; Fluid consistency: Thin  Diet effective now                   Consultants: None  Procedures: None    Medications:    amLODipine  2.5 mg Oral Daily   atenolol  25 mg Oral Daily   donepezil  10 mg Oral Daily   feeding supplement  237 mL Oral BID BM   FLUoxetine  10 mg Oral Daily   levothyroxine  100 mcg Oral Q0600   multivitamin with minerals  1 tablet Oral Daily   QUEtiapine  12.5 mg Oral BID   And   QUEtiapine  25 mg Oral BID   Continuous Infusions:   Anti-infectives (From admission, onward)    Start     Dose/Rate Route Frequency Ordered Stop   12/07/23 1630  fosfomycin (MONUROL) packet 3 g        3 g Oral  Once 12/07/23 1527 12/07/23 1707   10/02/23 2000  ciprofloxacin (CIPRO) tablet 500 mg        500 mg Oral 2 times daily 10/02/23 1258 10/05/23 0759   10/01/23 2000  ciprofloxacin (CIPRO)  tablet 500 mg  Status:  Discontinued        500 mg Oral 2 times daily 10/01/23 1119 10/02/23 1258   09/28/23 1315  ciprofloxacin (CIPRO) tablet 500 mg  Status:  Discontinued        500 mg Oral 2 times daily 09/28/23 1224 10/01/23 1119   09/26/23 2000  cefTRIAXone (ROCEPHIN) 1 g in sodium chloride 0.9 % 100 mL IVPB  Status:  Discontinued        1 g 200 mL/hr over 30 Minutes Intravenous Every 24 hours 09/25/23 2228 09/28/23 1225   09/25/23 2130  cefTRIAXone (ROCEPHIN) 1 g in sodium chloride 0.9 % 100 mL IVPB        1 g 200 mL/hr over 30 Minutes Intravenous  Once 09/25/23 2121 09/25/23 2210              Family Communication/Anticipated D/C date and  plan/Code Status   DVT prophylaxis:      Code Status: Do not attempt resuscitation (DNR) PRE-ARREST INTERVENTIONS DESIRED  Family Communication: None Disposition Plan: Plan to discharge to long-term care facility  Status is: Inpatient Remains inpatient appropriate because: Awaiting placement   Subjective:   No haldol required ON. Calm this AM. NO mitts. Objective:    Vitals:   12/13/23 1532 12/13/23 1950 12/14/23 0427 12/14/23 0752  BP: (!) 156/63 (!) 193/83 (!) 174/75 (!) 172/85  Pulse: 81 91 74 66  Resp: 18 20 16 16   Temp: (!) 97.5 F (36.4 C) 97.6 F (36.4 C) 97.8 F (36.6 C) (!) 96.3 F (35.7 C)  TempSrc:  Oral Oral   SpO2: 98% 91% 100% 97%  Weight:   43.9 kg   Height:       No data found.  No intake or output data in the 24 hours ending 12/14/23 1940   Filed Weights   12/11/23 0552 12/13/23 0500 12/14/23 0427  Weight: 47.1 kg 46.9 kg 43.9 kg    Exam:    Physical Exam  Constitutional: Resting  in NAD  Cardiovascular: Normal rate, regular rhythm. No lower extremity edema  Pulmonary: Non labored breathing on room air, no wheezing or rales.  Abdominal: Soft. Normal bowel sounds. Non distended   Skin: Skin is warm and dry.   Data Reviewed:   I have personally reviewed following labs and imaging studies:  Labs: Labs show the following:   Basic Metabolic Panel: No results for input(s): "NA", "K", "CL", "CO2", "GLUCOSE", "BUN", "CREATININE", "CALCIUM", "MG", "PHOS" in the last 168 hours.  GFR Estimated Creatinine Clearance: 30.4 mL/min (by C-G formula based on SCr of 0.87 mg/dL). Liver Function Tests: No results for input(s): "AST", "ALT", "ALKPHOS", "BILITOT", "PROT", "ALBUMIN" in the last 168 hours. No results for input(s): "LIPASE", "AMYLASE" in the last 168 hours. No results for input(s): "AMMONIA" in the last 168 hours. Coagulation profile No results for input(s): "INR", "PROTIME" in the last 168 hours.  CBC: No results for input(s):  "WBC", "NEUTROABS", "HGB", "HCT", "MCV", "PLT" in the last 168 hours.  Cardiac Enzymes: No results for input(s): "CKTOTAL", "CKMB", "CKMBINDEX", "TROPONINI" in the last 168 hours. BNP (last 3 results) No results for input(s): "PROBNP" in the last 8760 hours. CBG: No results for input(s): "GLUCAP" in the last 168 hours. D-Dimer: No results for input(s): "DDIMER" in the last 72 hours. Hgb A1c: No results for input(s): "HGBA1C" in the last 72 hours. Lipid Profile: No results for input(s): "CHOL", "HDL", "LDLCALC", "TRIG", "CHOLHDL", "LDLDIRECT" in the last 72 hours. Thyroid function  studies: No results for input(s): "TSH", "T4TOTAL", "T3FREE", "THYROIDAB" in the last 72 hours.  Invalid input(s): "FREET3" Anemia work up: No results for input(s): "VITAMINB12", "FOLATE", "FERRITIN", "TIBC", "IRON", "RETICCTPCT" in the last 72 hours. Sepsis Labs: No results for input(s): "PROCALCITON", "WBC", "LATICACIDVEN" in the last 168 hours.   Microbiology No results found for this or any previous visit (from the past 240 hours).  Procedures and diagnostic studies:  No results found.    LOS: 79 days   Marolyn Haller MD  [facility]   Pager on [email or web address]. If 7PM-7AM, please contact night-coverage at [email or web address]  12/14/2023, 7:40 PM

## 2023-12-14 NOTE — Plan of Care (Signed)
  Problem: Skin Integrity: Goal: Risk for impaired skin integrity will decrease Outcome: Progressing   Problem: Safety: Goal: Ability to remain free from injury will improve Outcome: Progressing   Problem: Pain Management: Goal: General experience of comfort will improve Outcome: Progressing   Problem: Nutrition: Goal: Adequate nutrition will be maintained Outcome: Progressing   Problem: Elimination: Goal: Will not experience complications related to bowel motility Outcome: Progressing Goal: Will not experience complications related to urinary retention Outcome: Progressing

## 2023-12-14 NOTE — Care Management Important Message (Signed)
Important Message  Patient Details  Name: Allison [name] MRN: [id number] Date of Birth: 1932-09-24   Important Message Given:  Yes - Medicare IM     Bernadette Hoit 12/14/2023, 11:00 AM

## 2023-12-14 NOTE — Plan of Care (Signed)

## 2023-12-15 DIAGNOSIS — G9341 Metabolic encephalopathy: Secondary | ICD-10-CM | POA: Diagnosis not present

## 2023-12-15 DIAGNOSIS — N390 Urinary tract infection, site not specified: Secondary | ICD-10-CM | POA: Diagnosis not present

## 2023-12-15 DIAGNOSIS — R319 Hematuria, unspecified: Secondary | ICD-10-CM | POA: Diagnosis not present

## 2023-12-15 NOTE — Progress Notes (Signed)
Progress Note   Patient: Allison [name]:[id number] DOB: August 30, 1932 DOA: 09/25/2023     80 DOS: the patient was seen and examined on 12/15/2023   Brief hospital course: Allison [name] is a 89 y.o. female with medical history significant for dementia, hypertension, previously admitted in [place] facility, now on home hospice, was sent in by hospice by EMS to the hospital.  APS is involved in her case.  There were concerns for poor oral intake.  She was found to have acute UTI.  E. coli and Pseudomonas were isolated.  She completed ceftriaxone and ciprofloxacin on October 05, 2023.   On 12/03/2023, patient had a choking spell while eating lunch.  Diet was changed to dysphagia 1 diet because of concerns for high risk for aspiration. She is currently awaiting placement back at her facility. Medically stable for discharge    Assessment and Plan: Acute metabolic encephalopathy resolved Delirium Dementia Patient without mitts this AM.  -Continue Seroquel 25 mg nightly for recurrent agitation. Will add low dose AM dose for agitation.   -Continue Aricept and Prozac -Haldol as needed for breakthrough agitation      Acute UTI: Resolved   Urine culture with E. coli and Pseudomonas and she is s/p ceftriaxone and ciprofloxacin on October 05, 2023. She was given 1 dose of fosfomycin on 12/07/2023 because of positive nitrates on urinalysis obtained on 12/06/2023.     Intermittent sinus bradycardia: Heart rate mostly within normal limits. Mostly in the 35s and 80s.Continue to monitor. -Consider stopping aricept/beta blocker    Diarrhea: Resolved   Co-morbidities: include severe protein calorie malnutrition, underweight, hypertension  Status is: Inpatient  Remains inpatient appropriate because: Awaiting placement   Subjective:  Patient seen and examined at bedside this morning Denies nausea vomiting  Physical Exam: Physical Exam  Constitutional: Resting  in NAD  Cardiovascular:  Normal rate, regular rhythm. Pulmonary: Non labored breathing on room air Abdominal: Soft. Normal bowel sounds. Non distended  CNS: Awake exhibiting symptoms of dementia    Family Communication: No family at bedside   Time spent: 35 minutes Data Reviewed:    Latest Ref Rng & Units 12/04/2023    4:29 AM 11/30/2023    9:17 AM 11/23/2023    6:15 AM  BMP  Glucose 70 - 99 mg/dL 94  89  98   BUN 8 - 23 mg/dL 38  44  36   Creatinine 0.44 - 1.00 mg/dL 9.56  2.13  0.86   Sodium 135 - 145 mmol/L 138  140  140   Potassium 3.5 - 5.1 mmol/L 4.4  4.0  4.2   Chloride 98 - 111 mmol/L 104  105  103   CO2 22 - 32 mmol/L 27  27  30    Calcium 8.9 - 10.3 mg/dL 8.6  8.7  8.7        Latest Ref Rng & Units 12/04/2023    4:29 AM 11/30/2023    9:17 AM 11/23/2023    6:15 AM  CBC  WBC 4.0 - 10.5 K/uL 7.9  6.8  5.7   Hemoglobin 12.0 - 15.0 g/dL 57.8  46.9  62.9   Hematocrit 36.0 - 46.0 % 35.6  37.8  34.3   Platelets 150 - 400 K/uL 244  270  281     Vitals:   12/14/23 0752 12/14/23 1956 12/15/23 0458 12/15/23 0832  BP: (!) 172/85 (!) 147/59 (!) 148/88 134/70  Pulse: 66 77  76  Resp: 16 16 18  16  Temp: (!) 96.3 F (35.7 C) 97.9 F (36.6 C) 97.7 F (36.5 C) (!) 97.5 F (36.4 C)  TempSrc:  Axillary    SpO2: 97% 100% 100% 96%  Weight:      Height:          Author: Loyce Dys, MD 12/15/2023 5:16 PM  For on call review [email or web address].

## 2023-12-15 NOTE — Plan of Care (Signed)

## 2023-12-16 DIAGNOSIS — R319 Hematuria, unspecified: Secondary | ICD-10-CM | POA: Diagnosis not present

## 2023-12-16 DIAGNOSIS — G9341 Metabolic encephalopathy: Secondary | ICD-10-CM | POA: Diagnosis not present

## 2023-12-16 DIAGNOSIS — N390 Urinary tract infection, site not specified: Secondary | ICD-10-CM | POA: Diagnosis not present

## 2023-12-16 LAB — CBC WITH DIFFERENTIAL/PLATELET
Abs Immature Granulocytes: 0.03 10*3/uL (ref 0.00–0.07)
Basophils Absolute: 0 10*3/uL (ref 0.0–0.1)
Basophils Relative: 1 %
Eosinophils Absolute: 0.2 10*3/uL (ref 0.0–0.5)
Eosinophils Relative: 3 %
HCT: 38.7 % (ref 36.0–46.0)
Hemoglobin: 12.7 g/dL (ref 12.0–15.0)
Immature Granulocytes: 1 %
Lymphocytes Relative: 27 %
Lymphs Abs: 1.7 10*3/uL (ref 0.7–4.0)
MCH: 33.1 pg (ref 26.0–34.0)
MCHC: 32.8 g/dL (ref 30.0–36.0)
MCV: 100.8 fL — ABNORMAL HIGH (ref 80.0–100.0)
Monocytes Absolute: 0.7 10*3/uL (ref 0.1–1.0)
Monocytes Relative: 11 %
Neutro Abs: 3.7 10*3/uL (ref 1.7–7.7)
Neutrophils Relative %: 57 %
Platelets: 263 10*3/uL (ref 150–400)
RBC: 3.84 MIL/uL — ABNORMAL LOW (ref 3.87–5.11)
RDW: 12.9 % (ref 11.5–15.5)
WBC: 6.4 10*3/uL (ref 4.0–10.5)
nRBC: 0 % (ref 0.0–0.2)

## 2023-12-16 LAB — BASIC METABOLIC PANEL
Anion gap: 8 (ref 5–15)
BUN: 38 mg/dL — ABNORMAL HIGH (ref 8–23)
CO2: 31 mmol/L (ref 22–32)
Calcium: 8.7 mg/dL — ABNORMAL LOW (ref 8.9–10.3)
Chloride: 102 mmol/L (ref 98–111)
Creatinine, Ser: 0.74 mg/dL (ref 0.44–1.00)
GFR, Estimated: >60 mL/min (ref >60)
Glucose, Bld: 88 mg/dL (ref 70–99)
Potassium: 3.7 mmol/L (ref 3.5–5.1)
Sodium: 141 mmol/L (ref 135–145)

## 2023-12-16 NOTE — Plan of Care (Signed)

## 2023-12-16 NOTE — Progress Notes (Signed)
Progress Note   Patient: Allison [name] YQM:[id number] DOB: 11-11-1932 DOA: 09/25/2023     81 DOS: the patient was seen and examined on 12/16/2023     Brief hospital course: Allison [name] is a 89 y.o. female with medical history significant for dementia, hypertension, previously admitted in [place] facility, now on home hospice, was sent in by hospice by EMS to the hospital.  APS is involved in her case.  There were concerns for poor oral intake.  She was found to have acute UTI.  E. coli and Pseudomonas were isolated.  She completed ceftriaxone and ciprofloxacin on October 05, 2023.   On 12/03/2023, patient had a choking spell while eating lunch.  Diet was changed to dysphagia 1 diet because of concerns for high risk for aspiration. She is currently awaiting placement back at her facility. Medically stable for discharge     Assessment and Plan: Acute metabolic encephalopathy resolved Delirium Dementia -Continue Seroquel 25 mg nightly for recurrent agitation.  Continue Aricept and Prozac -Haldol as needed for breakthrough agitation      Acute UTI: Resolved   Urine culture with E. coli and Pseudomonas and she is s/p ceftriaxone and ciprofloxacin on October 05, 2023. She was given 1 dose of fosfomycin on 12/07/2023 because of positive nitrates on urinalysis obtained on 12/06/2023.     Intermittent sinus bradycardia: Heart rate mostly within normal limits. Mostly in the 78s and 80s.Continue to monitor. -Consider stopping aricept/beta blocker    Diarrhea: Resolved   Co-morbidities: include severe protein calorie malnutrition, underweight, hypertension   Status is: Inpatient   Remains inpatient appropriate because: Awaiting placement     Subjective:  Patient seen sitting up in bed eating does not have any complaints today Still waiting on placement   Physical Exam: Physical Exam  Constitutional: Resting  in NAD  Cardiovascular: Normal rate, regular rhythm. Pulmonary:  Non labored breathing on room air Abdominal: Soft. Normal bowel sounds. Non distended  CNS: Awake exhibiting symptoms of dementia       Family Communication: No family at bedside     Time spent: 37 minutes  Data Reviewed:    Latest Ref Rng & Units 12/16/2023    5:51 AM 12/04/2023    4:29 AM 11/30/2023    9:17 AM  CBC  WBC 4.0 - 10.5 K/uL 6.4  7.9  6.8   Hemoglobin 12.0 - 15.0 g/dL 52.8  41.3  24.4   Hematocrit 36.0 - 46.0 % 38.7  35.6  37.8   Platelets 150 - 400 K/uL 263  244  270        Latest Ref Rng & Units 12/16/2023    5:51 AM 12/04/2023    4:29 AM 11/30/2023    9:17 AM  BMP  Glucose 70 - 99 mg/dL 88  94  89   BUN 8 - 23 mg/dL 38  38  44   Creatinine 0.44 - 1.00 mg/dL 0.10  2.72  5.36   Sodium 135 - 145 mmol/L 141  138  140   Potassium 3.5 - 5.1 mmol/L 3.7  4.4  4.0   Chloride 98 - 111 mmol/L 102  104  105   CO2 22 - 32 mmol/L 31  27  27    Calcium 8.9 - 10.3 mg/dL 8.7  8.6  8.7      Vitals:   12/15/23 2216 12/16/23 0603 12/16/23 0611 12/16/23 0951  BP: 129/66 (!) 183/78 (!) 160/87 (!) 161/67  Pulse: 78 64 61 (!) 57  Resp: 17   16  Temp: 97.9 F (36.6 C)   97.8 F (36.6 C)  TempSrc: Axillary     SpO2: 95% 97%  100%  Weight:      Height:        Author: Loyce Dys, MD 12/16/2023 3:27 PM  For on call review [email or web address].

## 2023-12-17 DIAGNOSIS — G9341 Metabolic encephalopathy: Secondary | ICD-10-CM | POA: Diagnosis not present

## 2023-12-17 DIAGNOSIS — R319 Hematuria, unspecified: Secondary | ICD-10-CM | POA: Diagnosis not present

## 2023-12-17 DIAGNOSIS — N390 Urinary tract infection, site not specified: Secondary | ICD-10-CM | POA: Diagnosis not present

## 2023-12-17 LAB — BASIC METABOLIC PANEL
Anion gap: 9 (ref 5–15)
BUN: 44 mg/dL — ABNORMAL HIGH (ref 8–23)
CO2: 31 mmol/L (ref 22–32)
Calcium: 9 mg/dL (ref 8.9–10.3)
Chloride: 100 mmol/L (ref 98–111)
Creatinine, Ser: 0.85 mg/dL (ref 0.44–1.00)
GFR, Estimated: >60 mL/min (ref >60)
Glucose, Bld: 100 mg/dL — ABNORMAL HIGH (ref 70–99)
Potassium: 3.9 mmol/L (ref 3.5–5.1)
Sodium: 140 mmol/L (ref 135–145)

## 2023-12-17 LAB — CBC WITH DIFFERENTIAL/PLATELET
Abs Immature Granulocytes: 0.03 10*3/uL (ref 0.00–0.07)
Basophils Absolute: 0 10*3/uL (ref 0.0–0.1)
Basophils Relative: 0 %
Eosinophils Absolute: 0.2 10*3/uL (ref 0.0–0.5)
Eosinophils Relative: 3 %
HCT: 37.4 % (ref 36.0–46.0)
Hemoglobin: 12.2 g/dL (ref 12.0–15.0)
Immature Granulocytes: 0 %
Lymphocytes Relative: 26 %
Lymphs Abs: 1.7 10*3/uL (ref 0.7–4.0)
MCH: 33 pg (ref 26.0–34.0)
MCHC: 32.6 g/dL (ref 30.0–36.0)
MCV: 101.1 fL — ABNORMAL HIGH (ref 80.0–100.0)
Monocytes Absolute: 0.7 10*3/uL (ref 0.1–1.0)
Monocytes Relative: 10 %
Neutro Abs: 4 10*3/uL (ref 1.7–7.7)
Neutrophils Relative %: 61 %
Platelets: 262 10*3/uL (ref 150–400)
RBC: 3.7 MIL/uL — ABNORMAL LOW (ref 3.87–5.11)
RDW: 12.8 % (ref 11.5–15.5)
WBC: 6.7 10*3/uL (ref 4.0–10.5)
nRBC: 0 % (ref 0.0–0.2)

## 2023-12-17 NOTE — Plan of Care (Addendum)
Patient is alert and oriented X 1. She can able to get up to the [facility] with one assist.Denies any  pain. No any agitation noted.  Problem: Education: Goal: Knowledge of General Education information will improve Description: Including pain rating scale, medication(s)/side effects and non-pharmacologic comfort measures Outcome: Progressing   Problem: Health Behavior/Discharge Planning: Goal: Ability to manage health-related needs will improve Outcome: Progressing   Problem: Clinical Measurements: Goal: Ability to maintain clinical measurements within normal limits will improve Outcome: Progressing Goal: Will remain free from infection Outcome: Progressing Goal: Diagnostic test results will improve Outcome: Progressing Goal: Respiratory complications will improve Outcome: Progressing Goal: Cardiovascular complication will be avoided Outcome: Progressing   Problem: Activity: Goal: Risk for activity intolerance will decrease Outcome: Progressing   Problem: Nutrition: Goal: Adequate nutrition will be maintained Outcome: Progressing   Problem: Coping: Goal: Level of anxiety will decrease Outcome: Progressing   Problem: Elimination: Goal: Will not experience complications related to bowel motility Outcome: Progressing Goal: Will not experience complications related to urinary retention Outcome: Progressing   Problem: Pain Management: Goal: General experience of comfort will improve Outcome: Progressing   Problem: Safety: Goal: Ability to remain free from injury will improve Outcome: Progressing   Problem: Skin Integrity: Goal: Risk for impaired skin integrity will decrease Outcome: Progressing

## 2023-12-17 NOTE — Progress Notes (Signed)
Progress Note   Patient: Allison [name] [id number]:[id number] DOB: 11/29/1932 DOA: 09/25/2023     82 DOS: the patient was seen and examined on 12/17/2023   Brief hospital course: Allison [name] is a 89 y.o. female with medical history significant for dementia, hypertension, previously admitted in [place] [facility] facility, now on home hospice, was sent in by hospice by EMS to the hospital.  APS is involved in her case.  There were concerns for poor oral intake.  She was found to have acute UTI.  E. coli and Pseudomonas were isolated.  She completed ceftriaxone and ciprofloxacin on October 05, 2023.   On 12/03/2023, patient had a choking spell while eating lunch.  Diet was changed to dysphagia 1 diet because of concerns for high risk for aspiration. She is currently awaiting placement back at her facility. Medically stable for discharge     Assessment and Plan: Acute metabolic encephalopathy resolved Delirium Dementia -Continue Seroquel 25 mg nightly for recurrent agitation.  Continue Aricept and Prozac -Haldol as needed for breakthrough agitation      Acute UTI: Resolved   Urine culture with E. coli and Pseudomonas and she is s/p ceftriaxone and ciprofloxacin on October 05, 2023. She was given 1 dose of fosfomycin on 12/07/2023 because of positive nitrates on urinalysis obtained on 12/06/2023.     Intermittent sinus bradycardia: Heart rate mostly within normal limits. Mostly in the 39s and 80s.Continue to monitor. -Consider stopping aricept/beta blocker    Diarrhea: Resolved   Co-morbidities: include severe protein calorie malnutrition, underweight, hypertension   Status is: Inpatient   Remains inpatient appropriate because: Awaiting placement     Subjective:  Patient seen and examined has been to the symptoms of dementia She denies any complaints Patient continues to await placement   Physical Exam: Physical Exam  Constitutional: Resting  in NAD  Cardiovascular: Normal rate,  regular rhythm. Pulmonary: Non labored breathing on room air Abdominal: Soft. Normal bowel sounds. Non distended  CNS: Awake exhibiting symptoms of dementia       Family Communication: No family at bedside    Data Reviewed:     Latest Ref Rng & Units 12/17/2023    5:38 AM 12/16/2023    5:51 AM 12/04/2023    4:29 AM  CBC  WBC 4.0 - 10.5 K/uL 6.7  6.4  7.9   Hemoglobin 12.0 - 15.0 g/dL 46.9  62.9  52.8   Hematocrit 36.0 - 46.0 % 37.4  38.7  35.6   Platelets 150 - 400 K/uL 262  263  244      Vitals:   12/16/23 0951 12/17/23 0407 12/17/23 0414 12/17/23 0829  BP: (!) 161/67 (!) 157/87  (!) 141/82  Pulse: (!) 57 69  74  Resp: 16 16  17   Temp: 97.8 F (36.6 C) 97.8 F (36.6 C)  (!) 97.5 F (36.4 C)  TempSrc:      SpO2: 100% 98%  99%  Weight:   46.7 kg   Height:          Latest Ref Rng & Units 12/17/2023    5:38 AM 12/16/2023    5:51 AM 12/04/2023    4:29 AM  BMP  Glucose 70 - 99 mg/dL 413  88  94   BUN 8 - 23 mg/dL 44  38  38   Creatinine 0.44 - 1.00 mg/dL 2.44  0.10  2.72   Sodium 135 - 145 mmol/L 140  141  138   Potassium 3.5 - 5.1 mmol/L  3.9  3.7  4.4   Chloride 98 - 111 mmol/L 100  102  104   CO2 22 - 32 mmol/L 31  31  27    Calcium 8.9 - 10.3 mg/dL 9.0  8.7  8.6      Time spent: 34 minutes  Author: Loyce Dys, MD 12/17/2023 2:43 PM  For on call review [email or web address].

## 2023-12-17 NOTE — TOC Progression Note (Signed)
Transition of Care [facility]) - Progression Note    Patient Details  Name: Allison [name] MRN: [id number] Date of Birth: 1932-10-16  [facility] of [facility]) CM/SW Contact  Allena Katz, LCSW Phone Number: 12/17/2023, 4:04 PM  Clinical Narrative:  CSW spoke with Jill Side from [place] who states that DSS still has not sent over a check.     Expected Discharge Plan:  (TBD) Barriers to Discharge: Continued Medical Work up  Expected Discharge Plan and Services       Living arrangements for the past 2 months: Single Family Home                                       Social Determinants of Health (SDOH) Interventions SDOH Screenings   Food Insecurity: Patient Unable To Answer (09/26/2023)  Housing: High Risk (09/26/2023)  Transportation Needs: Patient Unable To Answer (09/26/2023)  Utilities: Patient Unable To Answer (09/26/2023)  Tobacco Use: Low Risk  (10/12/2023)    Readmission Risk Interventions     No data to display

## 2023-12-18 DIAGNOSIS — R197 Diarrhea, unspecified: Secondary | ICD-10-CM | POA: Diagnosis not present

## 2023-12-18 MED ORDER — QUETIAPINE FUMARATE 25 MG PO TABS
25.0000 mg | ORAL_TABLET | Freq: Every day | ORAL | Status: DC
  Administered 2023-12-19 – 2023-12-28 (×10): 25 mg via ORAL
  Filled 2023-12-18 (×10): qty 1

## 2023-12-18 MED ORDER — QUETIAPINE FUMARATE 25 MG PO TABS
25.0000 mg | ORAL_TABLET | Freq: Every day | ORAL | Status: DC | PRN
  Administered 2023-12-19 – 2023-12-27 (×7): 25 mg via ORAL
  Filled 2023-12-18 (×10): qty 1

## 2023-12-18 NOTE — Plan of Care (Signed)
  Problem: Health Behavior/Discharge Planning: Goal: Ability to manage health-related needs will improve Outcome: Progressing   Problem: Clinical Measurements: Goal: Will remain free from infection Outcome: Progressing Goal: Respiratory complications will improve Outcome: Progressing   Problem: Activity: Goal: Risk for activity intolerance will decrease Outcome: Progressing

## 2023-12-18 NOTE — Progress Notes (Signed)
PROGRESS NOTE    CARLYE KOMAR  WUJ:[id number] DOB: July 26, 1932 DOA: 09/25/2023 PCP: Alan Mulder, MD    Brief Narrative:   MELINAH CATTERTON is a 89 y.o. female with medical history significant for dementia, hypertension, previously admitted in [place] facility, now on home hospice, was sent in by hospice by EMS to the hospital.  APS is involved in her case.  There were concerns for poor oral intake.  She was found to have acute UTI.  E. coli and Pseudomonas were isolated.  She completed ceftriaxone and ciprofloxacin on October 05, 2023.   On 12/03/2023, patient had a choking spell while eating lunch.  Diet was changed to dysphagia 1 diet because of concerns for high risk for aspiration. She is currently awaiting placement back at her facility. Medically stable for discharge     Assessment & Plan:   Principal Problem:   Diarrhea Active Problems:   Metabolic encephalopathy   Urinary tract infection   Dementia with behavioral disturbance (HCC)   Hypokalemia   Sinus bradycardia   Inadequate oral intake   Pressure injury of skin   Protein-calorie malnutrition, severe   Underweight (BMI < 18.5)   Essential hypertension  Acute metabolic encephalopathy resolved Delirium Dementia -Continue Seroquel 25 mg nightly for recurrent agitation.  -Added as needed Seroquel for breakthrough agitation Continue Aricept and Prozac      Acute UTI: Resolved   Urine culture with E. coli and Pseudomonas and she is s/p ceftriaxone and ciprofloxacin on October 05, 2023. She was given 1 dose of fosfomycin on 12/07/2023 because of positive nitrates on urinalysis obtained on 12/06/2023.     Intermittent sinus bradycardia: Heart rate mostly within normal limits. Mostly in the 90s and 80s.Continue to monitor. -Consider stopping aricept/beta blocker    Diarrhea: Resolved   Co-morbidities: include severe protein calorie malnutrition, underweight, hypertension    DVT prophylaxis:  Lovenox Code Status: DNR Family Communication: None Disposition Plan: Status is: Inpatient Remains inpatient appropriate because: Unsafe discharge plan   Level of care: Med-Surg  Consultants:  None  Procedures:  None  Antimicrobials: None   Subjective: Seen and examined.  Resting in bed.  Opens eyes upon questioning but unable to provide any history.  Objective: Vitals:   12/17/23 1643 12/17/23 2145 12/18/23 0515 12/18/23 1040  BP: (!) 148/92 (!) 142/85 (!) 152/83 138/66  Pulse: 67 72 77 69  Resp: 16 16 14 16   Temp: 97.8 F (36.6 C) (!) 97.5 F (36.4 C) 98 F (36.7 C) 97.8 F (36.6 C)  TempSrc: Oral Oral Axillary Axillary  SpO2: 97% 100% 93% 97%  Weight:      Height:       No intake or output data in the 24 hours ending 12/18/23 1154 Filed Weights   12/13/23 0500 12/14/23 0427 12/17/23 0414  Weight: 46.9 kg 43.9 kg 46.7 kg    Examination:  General exam: Appears frail and chronically ill Respiratory system: Clear to auscultation. Respiratory effort normal. Cardiovascular system: S1-2, RRR, no murmurs Gastrointestinal system: Thin, soft, NT/ND, normal bowel sounds Central nervous system: Alert.  Unable to assess orientation.  No focal deficits Extremities: Decreased power Skin: No rashes, lesions or ulcers Psychiatry: Judgement and insight appear impaired. Mood & affect flattened.     Data Reviewed: I have personally reviewed following labs and imaging studies  CBC: Recent Labs  Lab 12/16/23 0551 12/17/23 0538  WBC 6.4 6.7  NEUTROABS 3.7 4.0  HGB 12.7 12.2  HCT 38.7 37.4  MCV  100.8* 101.1*  PLT 263 262   Basic Metabolic Panel: Recent Labs  Lab 12/16/23 0551 12/17/23 0538  NA 141 140  K 3.7 3.9  CL 102 100  CO2 31 31  GLUCOSE 88 100*  BUN 38* 44*  CREATININE 0.74 0.85  CALCIUM 8.7* 9.0   GFR: Estimated Creatinine Clearance: 33.1 mL/min (by C-G formula based on SCr of 0.85 mg/dL). Liver Function Tests: No results for input(s): "AST",  "ALT", "ALKPHOS", "BILITOT", "PROT", "ALBUMIN" in the last 168 hours. No results for input(s): "LIPASE", "AMYLASE" in the last 168 hours. No results for input(s): "AMMONIA" in the last 168 hours. Coagulation Profile: No results for input(s): "INR", "PROTIME" in the last 168 hours. Cardiac Enzymes: No results for input(s): "CKTOTAL", "CKMB", "CKMBINDEX", "TROPONINI" in the last 168 hours. BNP (last 3 results) No results for input(s): "PROBNP" in the last 8760 hours. HbA1C: No results for input(s): "HGBA1C" in the last 72 hours. CBG: No results for input(s): "GLUCAP" in the last 168 hours. Lipid Profile: No results for input(s): "CHOL", "HDL", "LDLCALC", "TRIG", "CHOLHDL", "LDLDIRECT" in the last 72 hours. Thyroid Function Tests: No results for input(s): "TSH", "T4TOTAL", "FREET4", "T3FREE", "THYROIDAB" in the last 72 hours. Anemia Panel: No results for input(s): "VITAMINB12", "FOLATE", "FERRITIN", "TIBC", "IRON", "RETICCTPCT" in the last 72 hours. Sepsis Labs: No results for input(s): "PROCALCITON", "LATICACIDVEN" in the last 168 hours.  No results found for this or any previous visit (from the past 240 hours).       Radiology Studies: No results found.      Scheduled Meds:  amLODipine  2.5 mg Oral Daily   atenolol  25 mg Oral Daily   donepezil  10 mg Oral Daily   feeding supplement  237 mL Oral BID BM   FLUoxetine  10 mg Oral Daily   levothyroxine  100 mcg Oral Q0600   multivitamin with minerals  1 tablet Oral Daily   [START ON 12/19/2023] QUEtiapine  25 mg Oral QHS   Continuous Infusions:   LOS: 83 days      Tresa Moore, MD [facility]   If 7PM-7AM, please contact night-coverage  12/18/2023, 11:54 AM

## 2023-12-19 DIAGNOSIS — R197 Diarrhea, unspecified: Secondary | ICD-10-CM | POA: Diagnosis not present

## 2023-12-19 LAB — GLUCOSE, CAPILLARY: Glucose-Capillary: 84 mg/dL (ref 70–99)

## 2023-12-19 NOTE — Plan of Care (Addendum)
Patient is alert and oriented X 1. Patient is agitated and try to get up from bed several times. Prn Seroquel given as order. It has little effect.Bed alarm on. Denies any pain.  Problem: Education: Goal: Knowledge of General Education information will improve Description: Including pain rating scale, medication(s)/side effects and non-pharmacologic comfort measures Outcome: Progressing   Problem: Health Behavior/Discharge Planning: Goal: Ability to manage health-related needs will improve Outcome: Progressing   Problem: Clinical Measurements: Goal: Ability to maintain clinical measurements within normal limits will improve Outcome: Progressing Goal: Will remain free from infection Outcome: Progressing Goal: Diagnostic test results will improve Outcome: Progressing Goal: Respiratory complications will improve Outcome: Progressing Goal: Cardiovascular complication will be avoided Outcome: Progressing   Problem: Activity: Goal: Risk for activity intolerance will decrease Outcome: Progressing   Problem: Nutrition: Goal: Adequate nutrition will be maintained Outcome: Progressing   Problem: Coping: Goal: Level of anxiety will decrease Outcome: Progressing   Problem: Elimination: Goal: Will not experience complications related to bowel motility Outcome: Progressing Goal: Will not experience complications related to urinary retention Outcome: Progressing   Problem: Pain Management: Goal: General experience of comfort will improve Outcome: Progressing   Problem: Safety: Goal: Ability to remain free from injury will improve Outcome: Progressing   Problem: Skin Integrity: Goal: Risk for impaired skin integrity will decrease Outcome: Progressing

## 2023-12-19 NOTE — Progress Notes (Signed)
PROGRESS NOTE    Allison [name]  WUJ:[id number] DOB: November 14, 1932 DOA: 09/25/2023 PCP: Alan Mulder, MD    Brief Narrative:   Allison [name] is a 89 y.o. female with medical history significant for dementia, hypertension, previously admitted in [place] facility, now on home hospice, was sent in by hospice by EMS to the hospital.  APS is involved in her case.  There were concerns for poor oral intake.  She was found to have acute UTI.  E. coli and Pseudomonas were isolated.  She completed ceftriaxone and ciprofloxacin on October 05, 2023.   On 12/03/2023, patient had a choking spell while eating lunch.  Diet was changed to dysphagia 1 diet because of concerns for high risk for aspiration. She is currently awaiting placement back at her facility. Medically stable for discharge     Assessment & Plan:   Principal Problem:   Diarrhea Active Problems:   Metabolic encephalopathy   Urinary tract infection   Dementia with behavioral disturbance (HCC)   Hypokalemia   Sinus bradycardia   Inadequate oral intake   Pressure injury of skin   Protein-calorie malnutrition, severe   Underweight (BMI < 18.5)   Essential hypertension  Acute metabolic encephalopathy resolved Delirium Dementia -Continue Seroquel 25 mg nightly for recurrent agitation.  -Added as needed Seroquel for breakthrough agitation Continue Aricept and Prozac  Acute UTI: Resolved   Urine culture with E. coli and Pseudomonas and she is s/p ceftriaxone and ciprofloxacin on October 05, 2023. Status post 1 dose fosfomycin 12/16     Intermittent sinus bradycardia: Heart rate mostly within normal limits. Mostly in the 55s and 80s.Continue to monitor. -Consider stopping aricept/beta blocker    Diarrhea: Resolved   Co-morbidities: include severe protein calorie malnutrition, underweight, hypertension    DVT prophylaxis: Lovenox Code Status: DNR Family Communication: None Disposition Plan: Status is:  Inpatient Remains inpatient appropriate because: Unsafe discharge plan   Level of care: Med-Surg  Consultants:  None  Procedures:  None  Antimicrobials: None   Subjective: Seen and examined.  Resting in bed.  Opens eyes but quickly falls back asleep.  Unable to provide history.  Objective: Vitals:   12/18/23 2012 12/19/23 0500 12/19/23 0532 12/19/23 0916  BP: (!) 154/62  (!) 155/70 (!) 158/66  Pulse: (!) 59  64 68  Resp: 18  18 16   Temp: 97.9 F (36.6 C)  (!) 97.5 F (36.4 C) 98.1 F (36.7 C)  TempSrc:      SpO2: 99%  96% 97%  Weight:  47.7 kg    Height:       No intake or output data in the 24 hours ending 12/19/23 1131 Filed Weights   12/14/23 0427 12/17/23 0414 12/19/23 0500  Weight: 43.9 kg 46.7 kg 47.7 kg    Examination:  General exam: NAD.  Frail and chronically ill Respiratory system: Clear to auscultation. Respiratory effort normal. Cardiovascular system: S1-2, RRR, no murmurs Gastrointestinal system: Thin, soft, NT/ND, normal bowel sounds Central nervous system: Alert.  Unable to assess orientation.  No focal deficits Extremities: Decreased power Skin: No rashes, lesions or ulcers Psychiatry: Judgement and insight appear impaired. Mood & affect flattened.     Data Reviewed: I have personally reviewed following labs and imaging studies  CBC: Recent Labs  Lab 12/16/23 0551 12/17/23 0538  WBC 6.4 6.7  NEUTROABS 3.7 4.0  HGB 12.7 12.2  HCT 38.7 37.4  MCV 100.8* 101.1*  PLT 263 262   Basic Metabolic Panel: Recent Labs  Lab 12/16/23 0551 12/17/23 0538  NA 141 140  K 3.7 3.9  CL 102 100  CO2 31 31  GLUCOSE 88 100*  BUN 38* 44*  CREATININE 0.74 0.85  CALCIUM 8.7* 9.0   GFR: Estimated Creatinine Clearance: 33.8 mL/min (by C-G formula based on SCr of 0.85 mg/dL). Liver Function Tests: No results for input(s): "AST", "ALT", "ALKPHOS", "BILITOT", "PROT", "ALBUMIN" in the last 168 hours. No results for input(s): "LIPASE", "AMYLASE" in  the last 168 hours. No results for input(s): "AMMONIA" in the last 168 hours. Coagulation Profile: No results for input(s): "INR", "PROTIME" in the last 168 hours. Cardiac Enzymes: No results for input(s): "CKTOTAL", "CKMB", "CKMBINDEX", "TROPONINI" in the last 168 hours. BNP (last 3 results) No results for input(s): "PROBNP" in the last 8760 hours. HbA1C: No results for input(s): "HGBA1C" in the last 72 hours. CBG: Recent Labs  Lab 12/19/23 0331  GLUCAP 84   Lipid Profile: No results for input(s): "CHOL", "HDL", "LDLCALC", "TRIG", "CHOLHDL", "LDLDIRECT" in the last 72 hours. Thyroid Function Tests: No results for input(s): "TSH", "T4TOTAL", "FREET4", "T3FREE", "THYROIDAB" in the last 72 hours. Anemia Panel: No results for input(s): "VITAMINB12", "FOLATE", "FERRITIN", "TIBC", "IRON", "RETICCTPCT" in the last 72 hours. Sepsis Labs: No results for input(s): "PROCALCITON", "LATICACIDVEN" in the last 168 hours.  No results found for this or any previous visit (from the past 240 hours).       Radiology Studies: No results found.      Scheduled Meds:  amLODipine  2.5 mg Oral Daily   atenolol  25 mg Oral Daily   donepezil  10 mg Oral Daily   feeding supplement  237 mL Oral BID BM   FLUoxetine  10 mg Oral Daily   levothyroxine  100 mcg Oral Q0600   multivitamin with minerals  1 tablet Oral Daily   QUEtiapine  25 mg Oral QHS   Continuous Infusions:   LOS: 84 days      Tresa Moore, MD [facility]   If 7PM-7AM, please contact night-coverage  12/19/2023, 11:31 AM

## 2023-12-19 NOTE — Plan of Care (Signed)

## 2023-12-19 NOTE — Plan of Care (Signed)
  Problem: Education: Goal: Knowledge of General Education information will improve Description: Including pain rating scale, medication(s)/side effects and non-pharmacologic comfort measures Outcome: Progressing   Problem: Clinical Measurements: Goal: Ability to maintain clinical measurements within normal limits will improve Outcome: Progressing   Problem: Activity: Goal: Risk for activity intolerance will decrease Outcome: Progressing   Problem: Coping: Goal: Level of anxiety will decrease Outcome: Progressing   Problem: Elimination: Goal: Will not experience complications related to bowel motility Outcome: Progressing   Problem: Pain Management: Goal: General experience of comfort will improve Outcome: Progressing   Problem: Safety: Goal: Ability to remain free from injury will improve Outcome: Progressing   Problem: Skin Integrity: Goal: Risk for impaired skin integrity will decrease Outcome: Progressing

## 2023-12-20 DIAGNOSIS — R197 Diarrhea, unspecified: Secondary | ICD-10-CM | POA: Diagnosis not present

## 2023-12-20 NOTE — Progress Notes (Signed)
PROGRESS NOTE    Allison [name]  WUJ:[id number] DOB: 11-16-1932 DOA: 09/25/2023 PCP: Alan Mulder, MD    Brief Narrative:   Allison [name] is a 89 y.o. female with medical history significant for dementia, hypertension, previously admitted in [place] facility, now on home hospice, was sent in by hospice by EMS to the hospital.  APS is involved in her case.  There were concerns for poor oral intake.  She was found to have acute UTI.  E. coli and Pseudomonas were isolated.  She completed ceftriaxone and ciprofloxacin on October 05, 2023.   On 12/03/2023, patient had a choking spell while eating lunch.  Diet was changed to dysphagia 1 diet because of concerns for high risk for aspiration. She is currently awaiting placement back at her facility. Medically stable for discharge     Assessment & Plan:   Principal Problem:   Diarrhea Active Problems:   Metabolic encephalopathy   Urinary tract infection   Dementia with behavioral disturbance (HCC)   Hypokalemia   Sinus bradycardia   Inadequate oral intake   Pressure injury of skin   Protein-calorie malnutrition, severe   Underweight (BMI < 18.5)   Essential hypertension  Acute metabolic encephalopathy resolved Delirium Dementia -Continue Seroquel 25 mg nightly for recurrent agitation.  -Continue as needed Seroquel for breakthrough agitation Continue Aricept and Prozac  Acute UTI: Resolved   Urine culture with E. coli and Pseudomonas and she is s/p ceftriaxone and ciprofloxacin on October 05, 2023. Status post 1 dose fosfomycin 12/16     Intermittent sinus bradycardia: Heart rate mostly within normal limits. Mostly in the 65s and 80s.Continue to monitor. -Consider stopping aricept/beta blocker    Diarrhea: Resolved   Co-morbidities: include severe protein calorie malnutrition, underweight, hypertension    DVT prophylaxis: Lovenox Code Status: DNR Family Communication: None Disposition Plan: Status is:  Inpatient Remains inpatient appropriate because: Unsafe discharge plan   Level of care: Med-Surg  Consultants:  None  Procedures:  None  Antimicrobials: None   Subjective: Seen and examined.  Resting in bed.  Opens eyes and but does not provide history.  No visible distress.  Objective: Vitals:   12/19/23 1628 12/19/23 2119 12/20/23 0500 12/20/23 0809  BP: (!) 120/56 (!) 108/44  130/61  Pulse: 65 75  66  Resp: 16 17  18   Temp: 97.8 F (36.6 C) 98.2 F (36.8 C)    TempSrc:      SpO2: 95% 94%  100%  Weight:   50.1 kg   Height:       No intake or output data in the 24 hours ending 12/20/23 1049 Filed Weights   12/17/23 0414 12/19/23 0500 12/20/23 0500  Weight: 46.7 kg 47.7 kg 50.1 kg    Examination:  General exam: No acute distress.  Appears frail and chronically ill Respiratory system: Clear to auscultation. Respiratory effort normal. Cardiovascular system: S1-2, RRR, no murmurs Gastrointestinal system: Thin, soft, NT/ND, normal bowel sounds Central nervous system: Alert.  Unable to assess orientation.  No focal deficits Extremities: Decreased power Skin: No rashes, lesions or ulcers Psychiatry: Judgement and insight appear impaired. Mood & affect flattened.     Data Reviewed: I have personally reviewed following labs and imaging studies  CBC: Recent Labs  Lab 12/16/23 0551 12/17/23 0538  WBC 6.4 6.7  NEUTROABS 3.7 4.0  HGB 12.7 12.2  HCT 38.7 37.4  MCV 100.8* 101.1*  PLT 263 262   Basic Metabolic Panel: Recent Labs  Lab 12/16/23 0551  12/17/23 0538  NA 141 140  K 3.7 3.9  CL 102 100  CO2 31 31  GLUCOSE 88 100*  BUN 38* 44*  CREATININE 0.74 0.85  CALCIUM 8.7* 9.0   GFR: Estimated Creatinine Clearance: 35.5 mL/min (by C-G formula based on SCr of 0.85 mg/dL). Liver Function Tests: No results for input(s): "AST", "ALT", "ALKPHOS", "BILITOT", "PROT", "ALBUMIN" in the last 168 hours. No results for input(s): "LIPASE", "AMYLASE" in the last  168 hours. No results for input(s): "AMMONIA" in the last 168 hours. Coagulation Profile: No results for input(s): "INR", "PROTIME" in the last 168 hours. Cardiac Enzymes: No results for input(s): "CKTOTAL", "CKMB", "CKMBINDEX", "TROPONINI" in the last 168 hours. BNP (last 3 results) No results for input(s): "PROBNP" in the last 8760 hours. HbA1C: No results for input(s): "HGBA1C" in the last 72 hours. CBG: Recent Labs  Lab 12/19/23 0331  GLUCAP 84   Lipid Profile: No results for input(s): "CHOL", "HDL", "LDLCALC", "TRIG", "CHOLHDL", "LDLDIRECT" in the last 72 hours. Thyroid Function Tests: No results for input(s): "TSH", "T4TOTAL", "FREET4", "T3FREE", "THYROIDAB" in the last 72 hours. Anemia Panel: No results for input(s): "VITAMINB12", "FOLATE", "FERRITIN", "TIBC", "IRON", "RETICCTPCT" in the last 72 hours. Sepsis Labs: No results for input(s): "PROCALCITON", "LATICACIDVEN" in the last 168 hours.  No results found for this or any previous visit (from the past 240 hours).       Radiology Studies: No results found.      Scheduled Meds:  amLODipine  2.5 mg Oral Daily   atenolol  25 mg Oral Daily   donepezil  10 mg Oral Daily   feeding supplement  237 mL Oral BID BM   FLUoxetine  10 mg Oral Daily   levothyroxine  100 mcg Oral Q0600   multivitamin with minerals  1 tablet Oral Daily   QUEtiapine  25 mg Oral QHS   Continuous Infusions:   LOS: 85 days      Tresa Moore, MD [facility]   If 7PM-7AM, please contact night-coverage  12/20/2023, 10:49 AM

## 2023-12-21 DIAGNOSIS — R197 Diarrhea, unspecified: Secondary | ICD-10-CM | POA: Diagnosis not present

## 2023-12-21 NOTE — TOC Progression Note (Signed)
Transition of Care [facility]) - Progression Note    Patient Details  Name: Allison [name] MRN: [id number] Date of Birth: 25-Nov-1932  Transition of Care [facility]) CM/SW Contact  Allena Katz, LCSW Phone Number: 12/21/2023, 11:05 AM  Clinical Narrative:   Email sent to [facility] as letter of intent has still not been obtained.     Expected Discharge Plan:  (TBD) Barriers to Discharge: Continued Medical Work up  Expected Discharge Plan and Services       Living arrangements for the past 2 months: Single Family Home                                       Social Determinants of Health (SDOH) Interventions SDOH Screenings   Food Insecurity: Patient Unable To Answer (09/26/2023)  Housing: High Risk (09/26/2023)  Transportation Needs: Patient Unable To Answer (09/26/2023)  Utilities: Patient Unable To Answer (09/26/2023)  Tobacco Use: Low Risk  (10/12/2023)    Readmission Risk Interventions     No data to display

## 2023-12-21 NOTE — Progress Notes (Signed)
PROGRESS NOTE    Allison [name]  ZOX:[id number] DOB: 1932-04-18 DOA: 09/25/2023 PCP: Alan Mulder, MD    Brief Narrative:   Allison [name] is a 89 y.o. female with medical history significant for dementia, hypertension, previously admitted in [place] facility, now on home hospice, was sent in by hospice by EMS to the hospital.  APS is involved in her case.  There were concerns for poor oral intake.  She was found to have acute UTI.  E. coli and Pseudomonas were isolated.  She completed ceftriaxone and ciprofloxacin on October 05, 2023.   On 12/03/2023, patient had a choking spell while eating lunch.  Diet was changed to dysphagia 1 diet because of concerns for high risk for aspiration. She is currently awaiting placement back at her facility. Medically stable for discharge     Assessment & Plan:   Principal Problem:   Diarrhea Active Problems:   Metabolic encephalopathy   Urinary tract infection   Dementia with behavioral disturbance (HCC)   Hypokalemia   Sinus bradycardia   Inadequate oral intake   Pressure injury of skin   Protein-calorie malnutrition, severe   Underweight (BMI < 18.5)   Essential hypertension  Acute metabolic encephalopathy resolved Delirium Dementia -Continue Seroquel 25 mg nightly for recurrent agitation.  -Continue as needed Seroquel for breakthrough agitation Continue Aricept and Prozac  Acute UTI: Resolved   Urine culture with E. coli and Pseudomonas and she is s/p ceftriaxone and ciprofloxacin on October 05, 2023. Status post 1 dose fosfomycin 12/16     Intermittent sinus bradycardia: Mostly within normal limits.    Diarrhea: Resolved   Co-morbidities: include severe protein calorie malnutrition, underweight, hypertension    DVT prophylaxis: Lovenox Code Status: DNR Family Communication: None Disposition Plan: Status is: Inpatient Remains inpatient appropriate because: Unsafe discharge plan   Level of care:  Med-Surg  Consultants:  None  Procedures:  None  Antimicrobials: None   Subjective: Seen and examined.  Resting in bed.  Opens eyes but does not provide any history.  No visible distress.  Objective: Vitals:   12/20/23 1933 12/21/23 0500 12/21/23 0609 12/21/23 0923  BP: (!) 154/78  130/64 (!) 160/58  Pulse: (!) 29  70 63  Resp: 18     Temp: 98.5 F (36.9 C)  97.6 F (36.4 C) (!) 97.4 F (36.3 C)  TempSrc:    Axillary  SpO2: (!) 76%  97% 97%  Weight:  50.7 kg    Height:       No intake or output data in the 24 hours ending 12/21/23 0948 Filed Weights   12/19/23 0500 12/20/23 0500 12/21/23 0500  Weight: 47.7 kg 50.1 kg 50.7 kg    Examination:  General exam: NAD.  Appears frail and fatigued Respiratory system: Clear to auscultation. Respiratory effort normal. Cardiovascular system: S1-2, RRR, no murmurs Gastrointestinal system: Thin, soft, NT/ND, normal bowel sounds Central nervous system: Alert.  Unable to assess orientation.  No focal deficits Extremities: Decreased power Skin: No rashes, lesions or ulcers Psychiatry: Judgement and insight appear impaired. Mood & affect flattened.     Data Reviewed: I have personally reviewed following labs and imaging studies  CBC: Recent Labs  Lab 12/16/23 0551 12/17/23 0538  WBC 6.4 6.7  NEUTROABS 3.7 4.0  HGB 12.7 12.2  HCT 38.7 37.4  MCV 100.8* 101.1*  PLT 263 262   Basic Metabolic Panel: Recent Labs  Lab 12/16/23 0551 12/17/23 0538  NA 141 140  K 3.7 3.9  CL 102 100  CO2 31 31  GLUCOSE 88 100*  BUN 38* 44*  CREATININE 0.74 0.85  CALCIUM 8.7* 9.0   GFR: Estimated Creatinine Clearance: 35.9 mL/min (by C-G formula based on SCr of 0.85 mg/dL). Liver Function Tests: No results for input(s): "AST", "ALT", "ALKPHOS", "BILITOT", "PROT", "ALBUMIN" in the last 168 hours. No results for input(s): "LIPASE", "AMYLASE" in the last 168 hours. No results for input(s): "AMMONIA" in the last 168 hours. Coagulation  Profile: No results for input(s): "INR", "PROTIME" in the last 168 hours. Cardiac Enzymes: No results for input(s): "CKTOTAL", "CKMB", "CKMBINDEX", "TROPONINI" in the last 168 hours. BNP (last 3 results) No results for input(s): "PROBNP" in the last 8760 hours. HbA1C: No results for input(s): "HGBA1C" in the last 72 hours. CBG: Recent Labs  Lab 12/19/23 0331  GLUCAP 84   Lipid Profile: No results for input(s): "CHOL", "HDL", "LDLCALC", "TRIG", "CHOLHDL", "LDLDIRECT" in the last 72 hours. Thyroid Function Tests: No results for input(s): "TSH", "T4TOTAL", "FREET4", "T3FREE", "THYROIDAB" in the last 72 hours. Anemia Panel: No results for input(s): "VITAMINB12", "FOLATE", "FERRITIN", "TIBC", "IRON", "RETICCTPCT" in the last 72 hours. Sepsis Labs: No results for input(s): "PROCALCITON", "LATICACIDVEN" in the last 168 hours.  No results found for this or any previous visit (from the past 240 hours).       Radiology Studies: No results found.      Scheduled Meds:  amLODipine  2.5 mg Oral Daily   atenolol  25 mg Oral Daily   donepezil  10 mg Oral Daily   feeding supplement  237 mL Oral BID BM   FLUoxetine  10 mg Oral Daily   levothyroxine  100 mcg Oral Q0600   multivitamin with minerals  1 tablet Oral Daily   QUEtiapine  25 mg Oral QHS   Continuous Infusions:   LOS: 86 days      Tresa Moore, MD [facility]   If 7PM-7AM, please contact night-coverage  12/21/2023, 9:48 AM

## 2023-12-22 DIAGNOSIS — R197 Diarrhea, unspecified: Secondary | ICD-10-CM | POA: Diagnosis not present

## 2023-12-22 NOTE — Plan of Care (Signed)

## 2023-12-22 NOTE — Progress Notes (Signed)
 PROGRESS NOTE    [name]  FMW:[id number] DOB: [birth year] DOA: 09/25/2023 PCP: Christi Vannie PARAS, MD    Brief Narrative:   Allison [name] is a 89 y.o. female with medical history significant for dementia, hypertension, previously admitted in [place] facility, now on home hospice, was sent in by hospice by EMS to the hospital.  APS is involved in her case.  There were concerns for poor oral intake.  She was found to have acute UTI.  E. coli and Pseudomonas were isolated.  She completed ceftriaxone  and ciprofloxacin  on October 05, 2023.   On 12/03/2023, patient had a choking spell while eating lunch.  Diet was changed to dysphagia 1 diet because of concerns for high risk for aspiration. She is currently awaiting placement back at her facility. Medically stable for discharge     Assessment & Plan:   Principal Problem:   Diarrhea Active Problems:   Metabolic encephalopathy   Urinary tract infection   Dementia with behavioral disturbance (HCC)   Hypokalemia   Sinus bradycardia   Inadequate oral intake   Pressure injury of skin   Protein-calorie malnutrition, severe   Underweight (BMI < 18.5)   Essential hypertension  Acute metabolic encephalopathy resolved Delirium Dementia -Continue Seroquel  25 mg nightly for recurrent agitation.  -Continue as needed Seroquel  for breakthrough agitation - Continue Aricept  and Prozac   Acute UTI: Resolved   Urine culture with E. coli and Pseudomonas and she is s/p ceftriaxone  and ciprofloxacin  on October 05, 2023. Status post 1 dose fosfomycin 12/16     Intermittent sinus bradycardia: Mostly within normal limits.    Diarrhea: Resolved   Co-morbidities: include severe protein calorie malnutrition, underweight, hypertension    DVT prophylaxis: Lovenox  Code Status: DNR Family Communication: None Disposition Plan: Status is: Inpatient Remains inpatient appropriate because: Unsafe discharge plan   Level of care:  Med-Surg  Consultants:  None  Procedures:  None  Antimicrobials: None   Subjective: Seen and examined.  Resting in bed.  Opens eyes.  Unable to provide history.  No visible distress.  Objective: Vitals:   12/21/23 0923 12/21/23 1712 12/22/23 0531 12/22/23 0840  BP: (!) 160/58 (!) 146/74 (!) 158/94 (!) 157/77  Pulse: 63 69 74 88  Resp:  16 18 17   Temp: (!) 97.4 F (36.3 C) 97.7 F (36.5 C) 97.6 F (36.4 C)   TempSrc: Axillary  Oral   SpO2: 97% 98% 100% 98%  Weight:      Height:        Intake/Output Summary (Last 24 hours) at 12/22/2023 1159 Last data filed at 12/21/2023 1237 Gross per 24 hour  Intake --  Output 1 ml  Net -1 ml   Filed Weights   12/19/23 0500 12/20/23 0500 12/21/23 0500  Weight: 47.7 kg 50.1 kg 50.7 kg    Examination:  General exam: NAD.  Appears frail Respiratory system: Clear to auscultation. Respiratory effort normal. Cardiovascular system: S1-2, RRR, no murmurs Gastrointestinal system: Thin, soft, NT/ND, normal bowel sounds Central nervous system: Alert.  Unable to assess orientation.  No focal deficits Extremities: Decreased power Skin: No rashes, lesions or ulcers Psychiatry: Judgement and insight appear impaired. Mood & affect flattened.     Data Reviewed: I have personally reviewed following labs and imaging studies  CBC: Recent Labs  Lab 12/16/23 0551 12/17/23 0538  WBC 6.4 6.7  NEUTROABS 3.7 4.0  HGB 12.7 12.2  HCT 38.7 37.4  MCV 100.8* 101.1*  PLT 263 262   Basic Metabolic Panel:  Recent Labs  Lab 12/16/23 0551 12/17/23 0538  NA 141 140  K 3.7 3.9  CL 102 100  CO2 31 31  GLUCOSE 88 100*  BUN 38* 44*  CREATININE 0.74 0.85  CALCIUM  8.7* 9.0   GFR: Estimated Creatinine Clearance: 35.9 mL/min (by C-G formula based on SCr of 0.85 mg/dL). Liver Function Tests: No results for input(s): AST, ALT, ALKPHOS, BILITOT, PROT, ALBUMIN in the last 168 hours. No results for input(s): LIPASE, AMYLASE in the  last 168 hours. No results for input(s): AMMONIA in the last 168 hours. Coagulation Profile: No results for input(s): INR, PROTIME in the last 168 hours. Cardiac Enzymes: No results for input(s): CKTOTAL, CKMB, CKMBINDEX, TROPONINI in the last 168 hours. BNP (last 3 results) No results for input(s): PROBNP in the last 8760 hours. HbA1C: No results for input(s): HGBA1C in the last 72 hours. CBG: Recent Labs  Lab 12/19/23 0331  GLUCAP 84   Lipid Profile: No results for input(s): CHOL, HDL, LDLCALC, TRIG, CHOLHDL, LDLDIRECT in the last 72 hours. Thyroid Function Tests: No results for input(s): TSH, T4TOTAL, FREET4, T3FREE, THYROIDAB in the last 72 hours. Anemia Panel: No results for input(s): VITAMINB12, FOLATE, FERRITIN, TIBC, IRON, RETICCTPCT in the last 72 hours. Sepsis Labs: No results for input(s): PROCALCITON, LATICACIDVEN in the last 168 hours.  No results found for this or any previous visit (from the past 240 hours).       Radiology Studies: No results found.      Scheduled Meds:  amLODipine   2.5 mg Oral Daily   atenolol   25 mg Oral Daily   donepezil   10 mg Oral Daily   feeding supplement  237 mL Oral BID BM   FLUoxetine   10 mg Oral Daily   levothyroxine   100 mcg Oral Q0600   multivitamin with minerals  1 tablet Oral Daily   QUEtiapine   25 mg Oral QHS   Continuous Infusions:   LOS: 87 days      Calvin KATHEE Robson, MD [facility]   If 7PM-7AM, please contact night-coverage  12/22/2023, 11:59 AM

## 2023-12-22 NOTE — Plan of Care (Signed)
  Problem: Activity: Goal: Risk for activity intolerance will decrease Outcome: Progressing   Problem: Coping: Goal: Level of anxiety will decrease Outcome: Progressing   

## 2023-12-23 DIAGNOSIS — G9341 Metabolic encephalopathy: Secondary | ICD-10-CM | POA: Diagnosis not present

## 2023-12-23 DIAGNOSIS — F03918 Unspecified dementia, unspecified severity, with other behavioral disturbance: Secondary | ICD-10-CM | POA: Diagnosis not present

## 2023-12-23 DIAGNOSIS — N390 Urinary tract infection, site not specified: Secondary | ICD-10-CM | POA: Diagnosis not present

## 2023-12-23 DIAGNOSIS — R197 Diarrhea, unspecified: Secondary | ICD-10-CM | POA: Diagnosis not present

## 2023-12-23 MED ORDER — ATENOLOL 25 MG PO TABS
12.5000 mg | ORAL_TABLET | Freq: Every day | ORAL | Status: DC
  Administered 2023-12-25 – 2023-12-29 (×5): 12.5 mg via ORAL
  Filled 2023-12-23 (×6): qty 0.5

## 2023-12-23 NOTE — Plan of Care (Signed)

## 2023-12-23 NOTE — Progress Notes (Signed)
 Progress Note   Patient: Allison [name] FMW:[id number] DOB: 03/03/1932 DOA: 09/25/2023     89 DOS: the patient was seen and examined on 12/23/2023   Brief hospital course: 89 y.o. female with medical history significant for Dementia, hypertension, previously at [facility], now on home hospice, sent in by hospice via EMS as and APS case.  Hospice staff had reported the patient had very poor oral intake.  Initial labs consistent with urinary tract infection which grew E. coli and Pseudomonas and completed treatment with Rocephin  and Cipro  on 10/14.  Patient is currently medically stable, awaiting safe disposition.  12/12: Vital stable, had a choking spell while eating lunch.  Chest x-ray ordered.  Swallow team switched her to dysphagia 1 diet.  Patient will remain high risk for aspiration due to advanced dementia.  12/13: Vitals and labs stable.  No leukocytosis.  Chest x-ray with left subsegmental atelectasis but no acute abnormality.  Will continue monitoring without any antibiotics.  12/16: Some worsening of agitation overnight, nursing concern of recurrence of UTI so UA was obtained which was positive for nitrite. Prior urine cultures with E. coli and Pseudomonas aeruginosa, giving 1 dose of fosfomycin.  1/1.  No disposition plan yet.  Assessment and Plan: * Acute metabolic encephalopathy Secondary to urinary tract infection and underlying dementia.  This has improved.  Urinary tract infection Acute cystitis with hematuria.  E. coli and Pseudomonas growing on cultures.  Completed antibiotics on 10/14.  Another dose of antibiotic on 12/16.  Dementia with behavioral disturbance (HCC) Patient on Aricept  and Prozac .  As needed Seroquel .  Diarrhea Resolved.  Essential hypertension Patient on atenolol  and Norvasc   Underweight (BMI < 18.5) BMI 18.25  Protein-calorie malnutrition, severe Continue supplements  Inadequate oral intake Encouraged eating.  Sinus bradycardia Patient  on atenolol .  Will decrease dose down to 12.5 mg.   Hypokalemia Replaced during hospital course        Subjective: Patient answers a few questions but does not elaborate.  Admitted 89 days ago with failure to thrive.  Physical Exam: Vitals:   12/22/23 1900 12/23/23 0500 12/23/23 0900 12/23/23 1544  BP: 116/80 (!) 163/61 121/88 128/74  Pulse: 80 (!) 58 (!) 109 (!) 57  Resp: 18 18 20 16   Temp: (!) 97.5 F (36.4 C)   98.6 F (37 C)  TempSrc: Oral Other (Comment)    SpO2: 98% 100% 98% (!) 89%  Weight:  51.3 kg    Height:       Physical Exam HENT:     Head: Normocephalic.  Eyes:     General: Lids are normal.     Conjunctiva/sclera: Conjunctivae normal.  Cardiovascular:     Rate and Rhythm: Normal rate and regular rhythm.     Heart sounds: Normal heart sounds, S1 normal and S2 normal.  Pulmonary:     Breath sounds: No decreased breath sounds, wheezing, rhonchi or rales.  Abdominal:     Palpations: Abdomen is soft.     Tenderness: There is no abdominal tenderness.  Musculoskeletal:     Right lower leg: No swelling.     Left lower leg: No swelling.  Skin:    General: Skin is warm.     Findings: No rash.  Neurological:     Mental Status: She is alert.     Data Reviewed: Last creatinine 0.85, last hemoglobin 12.2  Disposition: Status is: Inpatient Remains inpatient appropriate because: No plan  Planned Discharge Destination: Long-term care with hospice  Time spent: 27 minutes  Author: Charlie Patterson, MD 12/23/2023 4:10 PM  For on call review [email or web address].

## 2023-12-23 NOTE — Progress Notes (Signed)
 Patient very impulsive requiring frequent re-direction to stay seated or remain in bed. Assistance provided with toilet and staff assisted to ambulate with RW 2 laps around nursing station. Patient able to follow commands however demonstrates poor safety awareness and impulsivity.

## 2023-12-24 DIAGNOSIS — F03918 Unspecified dementia, unspecified severity, with other behavioral disturbance: Secondary | ICD-10-CM | POA: Diagnosis not present

## 2023-12-24 DIAGNOSIS — R197 Diarrhea, unspecified: Secondary | ICD-10-CM | POA: Diagnosis not present

## 2023-12-24 DIAGNOSIS — N390 Urinary tract infection, site not specified: Secondary | ICD-10-CM | POA: Diagnosis not present

## 2023-12-24 DIAGNOSIS — G9341 Metabolic encephalopathy: Secondary | ICD-10-CM | POA: Diagnosis not present

## 2023-12-24 MED ORDER — AMLODIPINE BESYLATE 5 MG PO TABS
5.0000 mg | ORAL_TABLET | Freq: Every day | ORAL | Status: DC
  Administered 2023-12-25: 5 mg via ORAL
  Filled 2023-12-24: qty 1

## 2023-12-24 MED ORDER — HALOPERIDOL 0.5 MG PO TABS
1.0000 mg | ORAL_TABLET | Freq: Four times a day (QID) | ORAL | Status: DC | PRN
  Administered 2023-12-24 – 2023-12-28 (×5): 1 mg via ORAL
  Filled 2023-12-24 (×5): qty 2

## 2023-12-24 NOTE — Plan of Care (Signed)

## 2023-12-24 NOTE — Progress Notes (Signed)
 Mobility Specialist - Progress Note   12/24/23 1040  Mobility  Activity Ambulated with assistance in hallway  Level of Assistance Minimal assist, patient does 75% or more  Assistive Device Front wheel walker  Distance Ambulated (ft) 120 ft  Activity Response Tolerated well  Mobility visit 1 Mobility     Pt attempting to get OOB on arrival, utilizing RA. Assist to don gown. Hand over hand cueing for hand placement onto RW. Pt follows commands intermittently. STS and ambulation with minA. Forward flex posture with L forearm resting on RW. Pt voiced fatigue and was wheeled back to room. Incontinent episode during transfer from chair-bed. Floors cleaned and peri-care performed. Pt returned supine with alarm set, needs in reach. Fell asleep quickly.   Lennette Seip Mobility Specialist 12/24/23, 10:52 AM

## 2023-12-24 NOTE — Progress Notes (Signed)
 Progress Note   Patient: Allison [name] FMW:[id number] DOB: Aug 11, 1932 DOA: 09/25/2023     89 DOS: the patient was seen and examined on 12/24/2023   Brief hospital course: 89 y.o. female with medical history significant for Dementia, hypertension, previously at [facility], now on home hospice, sent in by hospice via EMS as and APS case.  Hospice staff had reported the patient had very poor oral intake.  Initial labs consistent with urinary tract infection which grew E. coli and Pseudomonas and completed treatment with Rocephin  and Cipro  on 10/14.  Patient is currently medically stable, awaiting safe disposition.  12/12: Vital stable, had a choking spell while eating lunch.  Chest x-ray ordered.  Swallow team switched her to dysphagia 1 diet.  Patient will remain high risk for aspiration due to advanced dementia.  12/13: Vitals and labs stable.  No leukocytosis.  Chest x-ray with left subsegmental atelectasis but no acute abnormality.  Will continue monitoring without any antibiotics.  12/16: Some worsening of agitation overnight, nursing concern of recurrence of UTI so UA was obtained which was positive for nitrite. Prior urine cultures with E. coli and Pseudomonas aeruginosa, giving 1 dose of fosfomycin.  1/1.  No disposition plan yet.  Assessment and Plan: * Acute metabolic encephalopathy Secondary to urinary tract infection and underlying dementia.  This has improved.  Urinary tract infection Acute cystitis with hematuria.  E. coli and Pseudomonas growing on cultures.  Completed antibiotics on 10/14.  Another dose of antibiotic on 12/16.  Dementia with behavioral disturbance (HCC) Patient on Aricept  and Prozac .  As needed Seroquel  and Haldol .  Diarrhea Resolved.  Essential hypertension Patient on atenolol  (lower dose) and Norvasc  (will increase dose)  Underweight (BMI < 18.5) BMI 18.25  Protein-calorie malnutrition, severe Continue supplements  Inadequate oral  intake Encouraged eating.  Sinus bradycardia Watch closely with patient on atenolol  and Aricept    Hypokalemia Replaced during hospital course        Subjective: Patient was sitting up in bed.  Felt okay.  Follows some simple commands with straight leg raise.  Admitted 90 days ago with failure to thrive  Physical Exam: Vitals:   12/23/23 0900 12/23/23 1544 12/23/23 2110 12/24/23 0849  BP: 121/88 128/74 (!) 146/65 (!) 169/75  Pulse: (!) 109 (!) 57 79 (!) 56  Resp: 20 16 17 16   Temp:  98.6 F (37 C) (!) 97.5 F (36.4 C) (!) 97.4 F (36.3 C)  TempSrc:    Oral  SpO2: 98% (!) 89% 96% 99%  Weight:      Height:       Physical Exam HENT:     Head: Normocephalic.  Eyes:     General: Lids are normal.     Conjunctiva/sclera: Conjunctivae normal.  Cardiovascular:     Rate and Rhythm: Normal rate and regular rhythm.     Heart sounds: Normal heart sounds, S1 normal and S2 normal.  Pulmonary:     Breath sounds: No decreased breath sounds, wheezing, rhonchi or rales.  Abdominal:     Palpations: Abdomen is soft.     Tenderness: There is no abdominal tenderness.  Musculoskeletal:     Right lower leg: No swelling.     Left lower leg: No swelling.  Skin:    General: Skin is warm.     Findings: No rash.  Neurological:     Mental Status: She is alert.     Comments: Able to straight leg raise     Data Reviewed: No new  data today   Disposition: Status is: Inpatient Remains inpatient appropriate because: We do not have a disposition plan  Planned Discharge Destination: To be determined    Time spent: 26 minutes  Author: Charlie Patterson, MD 12/24/2023 5:10 PM  For on call review [email or web address].

## 2023-12-24 NOTE — TOC Progression Note (Signed)
 Transition of Care [facility]) - Progression Note    Patient Details  Name: Allison [name] MRN: [id number] Date of Birth: [birth year]  [facility]) CM/SW Contact  Nathifa Ritthaler A Avi Archuleta, RN Phone Number: 12/24/2023, 2:50 PM  Clinical Narrative:    Chart reviewed.  LVM for Othel Daring, Social Work for [employer].  Her contact number is [phone].  I have informed [name] that [facility] continues to wait on a letter of intent from DSS.   TOC will continue to follow for discharge planning.      Expected Discharge Plan:  (TBD) Barriers to Discharge: Continued Medical Work up  Expected Discharge Plan and Services       Living arrangements for the past 2 months: Single Family Home                                       Social Determinants of Health (SDOH) Interventions SDOH Screenings   Food Insecurity: Patient Unable To Answer (09/26/2023)  Housing: High Risk (09/26/2023)  Transportation Needs: Patient Unable To Answer (09/26/2023)  Utilities: Patient Unable To Answer (09/26/2023)  Social Connections: Patient Unable To Answer (12/22/2023)  Tobacco Use: Low Risk  (10/12/2023)    Readmission Risk Interventions     No data to display

## 2023-12-25 DIAGNOSIS — N3001 Acute cystitis with hematuria: Secondary | ICD-10-CM | POA: Diagnosis not present

## 2023-12-25 DIAGNOSIS — R197 Diarrhea, unspecified: Secondary | ICD-10-CM | POA: Diagnosis not present

## 2023-12-25 DIAGNOSIS — F03918 Unspecified dementia, unspecified severity, with other behavioral disturbance: Secondary | ICD-10-CM | POA: Diagnosis not present

## 2023-12-25 DIAGNOSIS — G9341 Metabolic encephalopathy: Secondary | ICD-10-CM | POA: Diagnosis not present

## 2023-12-25 NOTE — Plan of Care (Signed)
  Problem: Education: Goal: Knowledge of General Education information will improve Description: Including pain rating scale, medication(s)/side effects and non-pharmacologic comfort measures 12/25/2023 1725 by Price Lachapelle, RN Outcome: Progressing 12/25/2023 1725 by Balian Schaller, RN Outcome: Progressing 12/25/2023 1722 by Nekoda Chock, RN Outcome: Progressing   Problem: Health Behavior/Discharge Planning: Goal: Ability to manage health-related needs will improve 12/25/2023 1725 by Lumina Gitto, RN Outcome: Progressing 12/25/2023 1725 by Deitrich Steve, RN Outcome: Progressing 12/25/2023 1722 by Ishmeal Rorie, RN Outcome: Progressing   Problem: Clinical Measurements: Goal: Ability to maintain clinical measurements within normal limits will improve 12/25/2023 1725 by Cherell Colvin, RN Outcome: Progressing 12/25/2023 1725 by Hurschel Paynter, RN Outcome: Progressing 12/25/2023 1722 by Bradi Arbuthnot, RN Outcome: Progressing Goal: Will remain free from infection 12/25/2023 1725 by Nava Song, RN Outcome: Progressing 12/25/2023 1725 by Seini Lannom, RN Outcome: Progressing 12/25/2023 1722 by Genette Huertas, RN Outcome: Progressing Goal: Diagnostic test results will improve 12/25/2023 1725 by Torie Towle, RN Outcome: Progressing 12/25/2023 1725 by Scott Fix, RN Outcome: Progressing 12/25/2023 1722 by Laurita Peron, RN Outcome: Progressing Goal: Respiratory complications will improve 12/25/2023 1725 by Estee Yohe, RN Outcome: Progressing 12/25/2023 1725 by Thandiwe Siragusa, RN Outcome: Progressing 12/25/2023 1722 by Nikole Swartzentruber, RN Outcome: Progressing Goal: Cardiovascular complication will be avoided 12/25/2023 1725 by Fradel Baldonado, RN Outcome: Progressing 12/25/2023 1725 by Johnda Billiot, RN Outcome: Progressing 12/25/2023 1722 by Yerick Eggebrecht, RN Outcome: Progressing   Problem: Activity: Goal: Risk for activity intolerance will decrease 12/25/2023 1725  by Eduar Kumpf, RN Outcome: Progressing 12/25/2023 1725 by Neal Oshea, RN Outcome: Progressing 12/25/2023 1722 by Vinh Sachs, RN Outcome: Progressing   Problem: Nutrition: Goal: Adequate nutrition will be maintained 12/25/2023 1725 by Zenna Traister, RN Outcome: Progressing 12/25/2023 1725 by Jerrell Mangel, RN Outcome: Progressing 12/25/2023 1722 by Dariela Stoker, RN Outcome: Progressing   Problem: Coping: Goal: Level of anxiety will decrease 12/25/2023 1725 by Shirleyann Hutching, RN Outcome: Progressing 12/25/2023 1725 by Shirleyann Hutching, RN Outcome: Progressing 12/25/2023 1722 by Shirleyann Hutching, RN Outcome: Progressing   Problem: Elimination: Goal: Will not experience complications related to bowel motility 12/25/2023 1725 by Shirleyann Hutching, RN Outcome: Progressing 12/25/2023 1725 by Shirleyann Hutching, RN Outcome: Progressing 12/25/2023 1722 by Shirleyann Hutching, RN Outcome: Progressing Goal: Will not experience complications related to urinary retention 12/25/2023 1725 by Shirleyann Hutching, RN Outcome: Progressing 12/25/2023 1725 by Shirleyann Hutching, RN Outcome: Progressing 12/25/2023 1722 by Shirleyann Hutching, RN Outcome: Progressing   Problem: Pain Management: Goal: General experience of comfort will improve 12/25/2023 1725 by Joncarlo Friberg, RN Outcome: Progressing 12/25/2023 1725 by Shirleyann Hutching, RN Outcome: Progressing 12/25/2023 1722 by Shirleyann Hutching, RN Outcome: Progressing   Problem: Safety: Goal: Ability to remain free from injury will improve 12/25/2023 1725 by Shirleyann Hutching, RN Outcome: Progressing 12/25/2023 1725 by Shirleyann Hutching, RN Outcome: Progressing 12/25/2023 1722 by Shirleyann Hutching, RN Outcome: Progressing   Problem: Skin Integrity: Goal: Risk for impaired skin integrity will decrease 12/25/2023 1725 by Shirleyann Hutching, RN Outcome: Progressing 12/25/2023 1725 by Shirleyann Hutching, RN Outcome: Progressing 12/25/2023 1722 by Shirleyann Hutching, RN Outcome:  Progressing

## 2023-12-25 NOTE — Progress Notes (Signed)
 Progress Note   Patient: Allison [name] FMW:[id number] DOB: Nov 21, 1932 DOA: 09/25/2023     90 DOS: the patient was seen and examined on 12/25/2023   Brief hospital course: 89 y.o. female with medical history significant for Dementia, hypertension, previously at [facility], now on home hospice, sent in by hospice via EMS as and APS case.  Hospice staff had reported the patient had very poor oral intake.  Initial labs consistent with urinary tract infection which grew E. coli and Pseudomonas and completed treatment with Rocephin  and Cipro  on 10/14.  Patient is currently medically stable, awaiting safe disposition.  12/12: Vital stable, had a choking spell while eating lunch.  Chest x-ray ordered.  Swallow team switched her to dysphagia 1 diet.  Patient will remain high risk for aspiration due to advanced dementia.  12/13: Vitals and labs stable.  No leukocytosis.  Chest x-ray with left subsegmental atelectasis but no acute abnormality.  Will continue monitoring without any antibiotics.  12/16: Some worsening of agitation overnight, nursing concern of recurrence of UTI so UA was obtained which was positive for nitrite. Prior urine cultures with E. coli and Pseudomonas aeruginosa, giving 1 dose of fosfomycin.  1/1-3/25.  No disposition plan yet.  Assessment and Plan: * Dementia with behavioral disturbance (HCC) Patient on Aricept  and Prozac .  As needed Seroquel  and Haldol .  Acute metabolic encephalopathy Secondary to urinary tract infection and underlying dementia.  This has improved.  Urinary tract infection Acute cystitis with hematuria.  E. coli and Pseudomonas growing on cultures.  Completed antibiotics on 10/14.  Another dose of antibiotic on 12/16.  Diarrhea Resolved.  Essential hypertension Patient on atenolol  (lower dose of 12.5 mg daily) and Norvasc  (increased dose of 5 mg daily)  Underweight (BMI < 18.5) BMI 17.61  Protein-calorie malnutrition, severe Continue  supplements  Inadequate oral intake Encouraged eating.  Sinus bradycardia Heart rate improved with cutting back dose of atenolol .  Patient also on Aricept .   Hypokalemia Replaced during hospital course        Subjective: Patient kept her eyes closed most of the time while I was in there.  Did open them when I asked her to open her eyes.  Feels okay.  Admitted 91 days ago with failure to thrive.  Physical Exam: Vitals:   12/24/23 2111 12/25/23 0520 12/25/23 0818 12/25/23 1626  BP: 132/77 (!) 160/60 (!) 143/61 (!) 134/55  Pulse: 73 68 66 75  Resp:  17 14 14   Temp:  97.6 F (36.4 C) (!) 97.5 F (36.4 C) (!) 97.5 F (36.4 C)  TempSrc:   Axillary Oral  SpO2: 98% 98% 96% 97%  Weight:  49.5 kg    Height:       Physical Exam HENT:     Head: Normocephalic.  Eyes:     General: Lids are normal.     Conjunctiva/sclera: Conjunctivae normal.  Cardiovascular:     Rate and Rhythm: Normal rate and regular rhythm.     Heart sounds: Normal heart sounds, S1 normal and S2 normal.  Pulmonary:     Breath sounds: No decreased breath sounds, wheezing, rhonchi or rales.  Abdominal:     Palpations: Abdomen is soft.     Tenderness: There is no abdominal tenderness.  Musculoskeletal:     Right lower leg: No swelling.     Left lower leg: No swelling.  Skin:    General: Skin is warm.     Findings: No rash.  Neurological:     Mental  Status: She is alert.     Data Reviewed: No new data   Disposition: Status is: Inpatient Remains inpatient appropriate because: Awaiting plan  Planned Discharge Destination: Unknown    Time spent: 25 minutes  Author: Charlie Patterson, MD 12/25/2023 4:41 PM  For on call review [email or web address].

## 2023-12-25 NOTE — Plan of Care (Signed)

## 2023-12-25 NOTE — Progress Notes (Signed)
 Patient is  alert and oriented X 1, very impulsive, and trying to get out of bed very frequently.she is not able to follow commands and demonstrates poor safety awareness.Gave PRN seroquel  as order.it has no effect on her. MD made aware. 1:1 sitter at the bedside.

## 2023-12-26 DIAGNOSIS — N3001 Acute cystitis with hematuria: Secondary | ICD-10-CM | POA: Diagnosis not present

## 2023-12-26 DIAGNOSIS — G9341 Metabolic encephalopathy: Secondary | ICD-10-CM | POA: Diagnosis not present

## 2023-12-26 DIAGNOSIS — I10 Essential (primary) hypertension: Secondary | ICD-10-CM | POA: Diagnosis not present

## 2023-12-26 DIAGNOSIS — F03918 Unspecified dementia, unspecified severity, with other behavioral disturbance: Secondary | ICD-10-CM | POA: Diagnosis not present

## 2023-12-26 MED ORDER — AMLODIPINE BESYLATE 10 MG PO TABS
10.0000 mg | ORAL_TABLET | Freq: Every day | ORAL | Status: DC
  Administered 2023-12-26 – 2023-12-27 (×2): 10 mg via ORAL
  Filled 2023-12-26 (×2): qty 1

## 2023-12-26 NOTE — Progress Notes (Signed)
 Progress Note   Patient: Allison [name] FMW:[id number] DOB: 02-12-1932 DOA: 09/25/2023     91 DOS: the patient was seen and examined on 12/26/2023   Brief hospital course: 89 y.o. female with medical history significant for Dementia, hypertension, previously at [facility], now on home hospice, sent in by hospice via EMS as and APS case.  Hospice staff had reported the patient had very poor oral intake.  Initial labs consistent with urinary tract infection which grew E. coli and Pseudomonas and completed treatment with Rocephin  and Cipro  on 10/14.  Patient is currently medically stable, awaiting safe disposition.  12/12: Vital stable, had a choking spell while eating lunch.  Chest x-ray ordered.  Swallow team switched her to dysphagia 1 diet.  Patient will remain high risk for aspiration due to advanced dementia.  12/13: Vitals and labs stable.  No leukocytosis.  Chest x-ray with left subsegmental atelectasis but no acute abnormality.  Will continue monitoring without any antibiotics.  12/16: Some worsening of agitation overnight, nursing concern of recurrence of UTI so UA was obtained which was positive for nitrite. Prior urine cultures with E. coli and Pseudomonas aeruginosa, giving 1 dose of fosfomycin.  1/1-4/25.  No disposition plan yet.  Assessment and Plan: * Dementia with behavioral disturbance (HCC) Patient on Aricept  and Prozac .  As needed Seroquel  and Haldol .  Patient not wearing her gown.  Tries to get out of bed.  Acute metabolic encephalopathy Secondary to urinary tract infection and underlying dementia.  Essential hypertension Patient on atenolol  (lower dose of 12.5 mg daily) and Norvasc  (increased to 10 mg daily today)  Urinary tract infection Acute cystitis with hematuria.  E. coli and Pseudomonas growing on cultures.  Completed antibiotics on 10/14.  Another dose of antibiotic on 12/16.  Underweight (BMI < 18.5) BMI 17.61  Protein-calorie malnutrition,  severe Continue supplements  Inadequate oral intake Encouraged eating.  Diarrhea Resolved.  Sinus bradycardia Heart rate improved with cutting back dose of atenolol .  Patient also on Aricept .   Hypokalemia Replaced during hospital course        Subjective: Patient not wearing her gown.  Just using the blanket as a cover.  Tries to get out of the bed.  Admitted 91 days ago with failure to thrive  Physical Exam: Vitals:   12/25/23 0818 12/25/23 1626 12/25/23 2006 12/26/23 0817  BP: (!) 143/61 (!) 134/55 (!) 155/76 (!) 150/75  Pulse: 66 75 100 79  Resp: 14 14 18 19   Temp: (!) 97.5 F (36.4 C) (!) 97.5 F (36.4 C) 98.6 F (37 C) 98.5 F (36.9 C)  TempSrc: Axillary Oral    SpO2: 96% 97% 96% 97%  Weight:      Height:       Physical Exam HENT:     Head: Normocephalic.  Eyes:     General: Lids are normal.     Conjunctiva/sclera: Conjunctivae normal.  Cardiovascular:     Rate and Rhythm: Normal rate and regular rhythm.     Heart sounds: Normal heart sounds, S1 normal and S2 normal.  Pulmonary:     Breath sounds: No decreased breath sounds, wheezing, rhonchi or rales.  Abdominal:     Palpations: Abdomen is soft.     Tenderness: There is no abdominal tenderness.  Musculoskeletal:     Right lower leg: No swelling.     Left lower leg: No swelling.  Skin:    General: Skin is warm.     Findings: No rash.  Neurological:  Mental Status: She is alert.     Data Reviewed: No new data  Disposition: Status is: Inpatient Remains inpatient appropriate because: We do not have a plan  Planned Discharge Destination: Unknown    Time spent: 25 minutes  Author: Charlie Patterson, MD 12/26/2023 3:35 PM  For on call review [email or web address].

## 2023-12-26 NOTE — Plan of Care (Signed)
  Problem: Clinical Measurements: Goal: Ability to maintain clinical measurements within normal limits will improve Outcome: Progressing   Problem: Clinical Measurements: Goal: Diagnostic test results will improve Outcome: Progressing   Problem: Clinical Measurements: Goal: Respiratory complications will improve Outcome: Progressing   Problem: Activity: Goal: Risk for activity intolerance will decrease Outcome: Progressing   Problem: Coping: Goal: Level of anxiety will decrease Outcome: Progressing

## 2023-12-26 NOTE — Plan of Care (Addendum)
 Patient Is alert and oriented X 1. She is still agitated and trying to get  out of bed. Prn seroquel  given. It has little effect. 1:1 sitter at the bedside. Denies any pain. Plan of care ongoing.    Problem: Education: Goal: Knowledge of General Education information will improve Description: Including pain rating scale, medication(s)/side effects and non-pharmacologic comfort measures Outcome: Progressing   Problem: Health Behavior/Discharge Planning: Goal: Ability to manage health-related needs will improve Outcome: Progressing   Problem: Clinical Measurements: Goal: Ability to maintain clinical measurements within normal limits will improve Outcome: Progressing Goal: Will remain free from infection Outcome: Progressing Goal: Diagnostic test results will improve Outcome: Progressing Goal: Respiratory complications will improve Outcome: Progressing Goal: Cardiovascular complication will be avoided Outcome: Progressing   Problem: Activity: Goal: Risk for activity intolerance will decrease Outcome: Progressing   Problem: Nutrition: Goal: Adequate nutrition will be maintained Outcome: Progressing   Problem: Coping: Goal: Level of anxiety will decrease Outcome: Progressing   Problem: Elimination: Goal: Will not experience complications related to bowel motility Outcome: Progressing Goal: Will not experience complications related to urinary retention Outcome: Progressing   Problem: Pain Management: Goal: General experience of comfort will improve Outcome: Progressing   Problem: Safety: Goal: Ability to remain free from injury will improve Outcome: Progressing   Problem: Skin Integrity: Goal: Risk for impaired skin integrity will decrease Outcome: Progressing

## 2023-12-27 DIAGNOSIS — G9341 Metabolic encephalopathy: Secondary | ICD-10-CM | POA: Diagnosis not present

## 2023-12-27 DIAGNOSIS — F03918 Unspecified dementia, unspecified severity, with other behavioral disturbance: Secondary | ICD-10-CM | POA: Diagnosis not present

## 2023-12-27 DIAGNOSIS — I10 Essential (primary) hypertension: Secondary | ICD-10-CM | POA: Diagnosis not present

## 2023-12-27 DIAGNOSIS — N3001 Acute cystitis with hematuria: Secondary | ICD-10-CM | POA: Diagnosis not present

## 2023-12-27 NOTE — Plan of Care (Addendum)
 Patient is alert and oriented X 1. Patient is impulsive and trying to get up from the bed  multiple times. Frequent redirection is given. PRN seroquel  given as order. Assisted her to do [facility] and ambulate with RW and chairs around the nursing station. She has  a small skin teat on her right fore arm while she  was trying to get up from the bed. Denies any pain. Plan of care ongoing.       Problem: Education: Goal: Knowledge of General Education information will improve Description: Including pain rating scale, medication(s)/side effects and non-pharmacologic comfort measures Outcome: Progressing   Problem: Health Behavior/Discharge Planning: Goal: Ability to manage health-related needs will improve Outcome: Progressing   Problem: Clinical Measurements: Goal: Ability to maintain clinical measurements within normal limits will improve Outcome: Progressing Goal: Will remain free from infection Outcome: Progressing Goal: Diagnostic test results will improve Outcome: Progressing Goal: Respiratory complications will improve Outcome: Progressing Goal: Cardiovascular complication will be avoided Outcome: Progressing   Problem: Activity: Goal: Risk for activity intolerance will decrease Outcome: Progressing   Problem: Nutrition: Goal: Adequate nutrition will be maintained Outcome: Progressing   Problem: Coping: Goal: Level of anxiety will decrease Outcome: Progressing   Problem: Elimination: Goal: Will not experience complications related to bowel motility Outcome: Progressing Goal: Will not experience complications related to urinary retention Outcome: Progressing   Problem: Pain Management: Goal: General experience of comfort will improve Outcome: Progressing   Problem: Safety: Goal: Ability to remain free from injury will improve Outcome: Progressing   Problem: Skin Integrity: Goal: Risk for impaired skin integrity will decrease Outcome: Progressing

## 2023-12-27 NOTE — Plan of Care (Signed)

## 2023-12-27 NOTE — Progress Notes (Signed)
 Progress Note   Patient: Allison [name] FMW:[id number] DOB: 1932-01-17 DOA: 09/25/2023     92 DOS: the patient was seen and examined on 12/27/2023   Brief hospital course: 89 y.o. female with medical history significant for Dementia, hypertension, previously at [facility], now on home hospice, sent in by hospice via EMS as and APS case.  Hospice staff had reported the patient had very poor oral intake.  Initial labs consistent with urinary tract infection which grew E. coli and Pseudomonas and completed treatment with Rocephin  and Cipro  on 10/14.  Patient is currently medically stable, awaiting safe disposition.  12/12: Vital stable, had a choking spell while eating lunch.  Chest x-ray ordered.  Swallow team switched her to dysphagia 1 diet.  Patient will remain high risk for aspiration due to advanced dementia.  12/13: Vitals and labs stable.  No leukocytosis.  Chest x-ray with left subsegmental atelectasis but no acute abnormality.  Will continue monitoring without any antibiotics.  12/16: Some worsening of agitation overnight, nursing concern of recurrence of UTI so UA was obtained which was positive for nitrite. Prior urine cultures with E. coli and Pseudomonas aeruginosa, giving 1 dose of fosfomycin.  1/1-5/25.  No disposition plan yet.  Assessment and Plan: * Dementia with behavioral disturbance (HCC) Patient on Aricept  and Prozac .  As needed Seroquel  and Haldol .  As per nursing staff tries to get out of the bed.  Acute metabolic encephalopathy Secondary to urinary tract infection and underlying dementia.  Essential hypertension Patient on atenolol  (12.5 mg daily) and Norvasc  (10 mg daily)  Urinary tract infection Acute cystitis with hematuria.  E. coli and Pseudomonas growing on cultures.  Completed antibiotics on 10/14.  Another dose of antibiotic on 12/16.  Underweight (BMI < 18.5) BMI 17.61  Protein-calorie malnutrition, severe Continue supplements  Inadequate oral  intake Encouraged eating.  Diarrhea Resolved.  Sinus bradycardia Heart rate improved with cutting back dose of atenolol .  Patient also on Aricept .   Hypokalemia Replaced during hospital course        Subjective: Patient had her down on this morning.  Answered some simple questions.  Does not elaborate much.  Admitted on 92 days ago with failure to thrive.  Physical Exam: Vitals:   12/25/23 2006 12/26/23 0817 12/26/23 1855 12/27/23 0743  BP: (!) 155/76 (!) 150/75 (!) 121/43 (!) 135/51  Pulse: 100 79 60 75  Resp: 18 19 16 16   Temp: 98.6 F (37 C) 98.5 F (36.9 C) 97.9 F (36.6 C) 98.1 F (36.7 C)  TempSrc:      SpO2: 96% 97% 98% 96%  Weight:      Height:       Physical Exam HENT:     Head: Normocephalic.  Eyes:     General: Lids are normal.     Conjunctiva/sclera: Conjunctivae normal.  Cardiovascular:     Rate and Rhythm: Normal rate and regular rhythm.     Heart sounds: Normal heart sounds, S1 normal and S2 normal.  Pulmonary:     Breath sounds: No decreased breath sounds, wheezing, rhonchi or rales.  Abdominal:     Palpations: Abdomen is soft.     Tenderness: There is no abdominal tenderness.  Musculoskeletal:     Right lower leg: No swelling.     Left lower leg: No swelling.  Skin:    General: Skin is warm.     Findings: No rash.  Neurological:     Mental Status: She is alert.  Data Reviewed: No new data today  Disposition: Status is: Inpatient Remains inpatient appropriate because: We do not have a disposition plan  Planned Discharge Destination: Unknown    Time spent: 26 minutes  Author: Charlie Patterson, MD 12/27/2023 3:19 PM  For on call review [email or web address].

## 2023-12-28 DIAGNOSIS — N3001 Acute cystitis with hematuria: Secondary | ICD-10-CM | POA: Diagnosis not present

## 2023-12-28 DIAGNOSIS — I10 Essential (primary) hypertension: Secondary | ICD-10-CM | POA: Diagnosis not present

## 2023-12-28 DIAGNOSIS — F03918 Unspecified dementia, unspecified severity, with other behavioral disturbance: Secondary | ICD-10-CM | POA: Diagnosis not present

## 2023-12-28 DIAGNOSIS — G9341 Metabolic encephalopathy: Secondary | ICD-10-CM | POA: Diagnosis not present

## 2023-12-28 MED ORDER — AMLODIPINE BESYLATE 5 MG PO TABS
5.0000 mg | ORAL_TABLET | Freq: Every day | ORAL | Status: DC
  Administered 2023-12-28 – 2023-12-29 (×2): 5 mg via ORAL
  Filled 2023-12-28 (×2): qty 1

## 2023-12-28 NOTE — Progress Notes (Signed)
 Progress Note   Patient: Allison [name] FMW:[id number] DOB: [birth year] DOA: 09/25/2023     88 DOS: the patient was seen and examined on 12/28/2023   Brief hospital course: 89 y.o. female with medical history significant for Dementia, hypertension, previously at [facility], now on home hospice, sent in by hospice via EMS as and APS case.  Hospice staff had reported the patient had very poor oral intake.  Initial labs consistent with urinary tract infection which grew E. coli and Pseudomonas and completed treatment with Rocephin  and Cipro  on 10/14.  Patient is currently medically stable, awaiting safe disposition.  12/12: Vital stable, had a choking spell while eating lunch.  Chest x-ray ordered.  Swallow team switched her to dysphagia 1 diet.  Patient will remain high risk for aspiration due to advanced dementia.  12/13: Vitals and labs stable.  No leukocytosis.  Chest x-ray with left subsegmental atelectasis but no acute abnormality.  Will continue monitoring without any antibiotics.  12/16: Some worsening of agitation overnight, nursing concern of recurrence of UTI so UA was obtained which was positive for nitrite. Prior urine cultures with E. coli and Pseudomonas aeruginosa, giving 1 dose of fosfomycin.  1/6.  Transitional care team notified me that [place] facility can take tomorrow.  Assessment and Plan: * Dementia with behavioral disturbance (HCC) Patient on Aricept  and Prozac .  As needed Seroquel  and Haldol .  As per nursing staff, tries to get out of the bed and needs to be redirected.  You may be able to walk with her if she does it on her own.  Earlier in the hospital stay, gets agitated when physical therapy or anybody tries to do anything with her.  Acute metabolic encephalopathy Secondary to urinary tract infection and underlying dementia.  Essential hypertension Patient on atenolol  (12.5 mg daily) and Norvasc  (will decrease Norvasc  to 5 mg daily)  Urinary tract  infection Acute cystitis with hematuria.  E. coli and Pseudomonas growing on cultures.  Completed antibiotics on 10/14.  Another dose of antibiotic on 12/16.  Underweight (BMI < 18.5) BMI 17.61  Protein-calorie malnutrition, severe Continue supplements  Inadequate oral intake Encouraged eating.  Diarrhea Resolved.  Sinus bradycardia Heart rate improved with cutting back dose of atenolol .  Patient also on Aricept .   Hypokalemia Replaced during hospital course        Subjective: Patient answers some simple questions.  Follows some simple commands.  Physical Exam: Vitals:   12/26/23 1855 12/27/23 0743 12/27/23 1607 12/28/23 0740  BP: (!) 121/43 (!) 135/51 (!) 112/59 (!) 146/58  Pulse: 60 75 78 87  Resp: 16 16 16 16   Temp: 97.9 F (36.6 C) 98.1 F (36.7 C) 97.8 F (36.6 C) 98.2 F (36.8 C)  TempSrc:      SpO2: 98% 96% 96% 98%  Weight:      Height:       Physical Exam HENT:     Head: Normocephalic.  Eyes:     General: Lids are normal.     Conjunctiva/sclera: Conjunctivae normal.  Cardiovascular:     Rate and Rhythm: Normal rate and regular rhythm.     Heart sounds: Normal heart sounds, S1 normal and S2 normal.  Pulmonary:     Breath sounds: No decreased breath sounds, wheezing, rhonchi or rales.  Abdominal:     Palpations: Abdomen is soft.     Tenderness: There is no abdominal tenderness.  Musculoskeletal:     Right lower leg: No swelling.     Left lower  leg: No swelling.  Skin:    General: Skin is warm.     Findings: No rash.  Neurological:     Mental Status: She is alert.     Data Reviewed: No new data  Family Communication: Updated son on the phone  Disposition: Status is: Inpatient Remains inpatient appropriate because: Just heard that [place] facility will take tomorrow.  Planned Discharge Destination: Long-term care    Time spent: 28 minutes  Author: Charlie Patterson, MD 12/28/2023 4:46 PM  For on call review [email or web address].

## 2023-12-28 NOTE — Plan of Care (Signed)

## 2023-12-29 DIAGNOSIS — F03918 Unspecified dementia, unspecified severity, with other behavioral disturbance: Secondary | ICD-10-CM | POA: Diagnosis not present

## 2023-12-29 DIAGNOSIS — I10 Essential (primary) hypertension: Secondary | ICD-10-CM | POA: Diagnosis not present

## 2023-12-29 DIAGNOSIS — G9341 Metabolic encephalopathy: Secondary | ICD-10-CM | POA: Diagnosis not present

## 2023-12-29 DIAGNOSIS — N3001 Acute cystitis with hematuria: Secondary | ICD-10-CM | POA: Diagnosis not present

## 2023-12-29 MED ORDER — ADULT MULTIVITAMIN W/MINERALS CH: 1.0000 | ORAL_TABLET | Freq: Every day | ORAL | 0 refills | Status: AC

## 2023-12-29 MED ORDER — HALOPERIDOL 1 MG PO TABS: 1.0000 mg | ORAL_TABLET | Freq: Four times a day (QID) | ORAL | 0 refills | Status: AC | PRN

## 2023-12-29 MED ORDER — CALCIUM CARBONATE ANTACID 500 MG PO CHEW: 1.0000 | CHEWABLE_TABLET | ORAL | 0 refills | Status: AC | PRN

## 2023-12-29 MED ORDER — ENSURE ENLIVE PO LIQD: 237.0000 mL | Freq: Two times a day (BID) | ORAL | 0 refills | Status: AC

## 2023-12-29 MED ORDER — OLANZAPINE 10 MG IM SOLR
5.0000 mg | Freq: Once | INTRAMUSCULAR | Status: AC
  Administered 2023-12-29: 5 mg via INTRAMUSCULAR
  Filled 2023-12-29: qty 10

## 2023-12-29 MED ORDER — ACETAMINOPHEN 325 MG PO TABS: 650.0000 mg | ORAL_TABLET | Freq: Four times a day (QID) | ORAL | Status: AC | PRN

## 2023-12-29 MED ORDER — QUETIAPINE FUMARATE 25 MG PO TABS: 25.0000 mg | ORAL_TABLET | Freq: Every day | ORAL | 0 refills | Status: AC | PRN

## 2023-12-29 MED ORDER — ATENOLOL 25 MG PO TABS: 12.5000 mg | ORAL_TABLET | Freq: Every day | ORAL | 0 refills | Status: AC

## 2023-12-29 MED ORDER — QUETIAPINE FUMARATE 25 MG PO TABS: 25.0000 mg | ORAL_TABLET | Freq: Every day | ORAL | 0 refills | Status: AC

## 2023-12-29 MED ORDER — AMLODIPINE BESYLATE 5 MG PO TABS: 5.0000 mg | ORAL_TABLET | Freq: Every day | ORAL | 0 refills | Status: AC

## 2023-12-29 NOTE — Discharge Summary (Signed)
 Physician Discharge Summary   Patient: Allison [name] MRN: [id number] DOB: 1932/07/30  Admit date:     09/25/2023  Discharge date: 12/29/23  Discharge Physician: Charlie Patterson   PCP: Christi Vannie PARAS, MD   Recommendations at discharge:    Follow up with Doctor at rehab 1 day Follow up with hospice at rehab  Discharge Diagnoses: Principal Problem:   Dementia with behavioral disturbance (HCC) Active Problems:   Acute metabolic encephalopathy   Essential hypertension   Urinary tract infection   Inadequate oral intake   Protein-calorie malnutrition, severe   Underweight (BMI < 18.5)   Sinus bradycardia   Diarrhea   Hypokalemia   Pressure injury of skin   Hospital Course: 89 y.o. female with medical history significant for Dementia, hypertension, previously at [facility], now on home hospice, sent in by hospice via EMS as and APS case.  Hospice staff had reported the patient had very poor oral intake.  Initial labs consistent with urinary tract infection which grew E. coli and Pseudomonas and completed treatment with Rocephin  and Cipro  on 10/14.  Patient is currently medically stable, awaiting safe disposition.  12/12: Vital stable, had a choking spell while eating lunch.  Chest x-ray ordered.  Swallow team switched her to dysphagia 1 diet.  Patient will remain high risk for aspiration due to advanced dementia.  12/13: Vitals and labs stable.  No leukocytosis.  Chest x-ray with left subsegmental atelectasis but no acute abnormality.  Will continue monitoring without any antibiotics.  12/16: Some worsening of agitation overnight, nursing concern of recurrence of UTI so UA was obtained which was positive for nitrite. Prior urine cultures with E. coli and Pseudomonas aeruginosa, giving 1 dose of fosfomycin.  1/6.  Transitional care team notified me that [facility] facility can take tomorrow.  Assessment and Plan: * Dementia with behavioral disturbance (HCC) Patient on  Aricept  and Prozac .  As needed Seroquel  and Haldol .  As per nursing staff, tries to get out of the bed and needs to be redirected.  You may be able to walk with her if she does it on her own.  Earlier in the hospital stay, gets agitated when physical therapy or anybody tries to do anything with her.  Acute metabolic encephalopathy Secondary to urinary tract infection and underlying dementia.  Essential hypertension Patient on atenolol  (12.5 mg daily) and Norvasc  (Norvasc  to 5 mg daily)  Urinary tract infection Acute cystitis with hematuria.  E. coli and Pseudomonas growing on cultures.  Completed antibiotics on 10/14.  Another dose of antibiotic on 12/16.  Underweight (BMI < 18.5) BMI 17.61  Protein-calorie malnutrition, severe Continue supplements  Inadequate oral intake Encouraged eating.  Diarrhea Resolved.  Sinus bradycardia Heart rate improved with cutting back dose of atenolol .  Patient also on Aricept .   Hypokalemia Replaced during hospital course         Consultants: none Procedures performed:none Disposition: Long term care with hospice Diet recommendation:  Dysphagia 1 diet with thin liquids DISCHARGE MEDICATION: Allergies as of 12/29/2023   No Known Allergies      Medication List     STOP taking these medications    cloNIDine 0.2 mg/24hr patch Commonly known as: CATAPRES - Dosed in mg/24 hr   fenofibrate 160 MG tablet   ibandronate 150 MG tablet Commonly known as: BONIVA   lisinopril -hydrochlorothiazide  20-12.5 MG tablet Commonly known as: ZESTORETIC    nitroGLYCERIN 0.6 mg/hr patch Commonly known as: NITRODUR - Dosed in mg/24 hr  TAKE these medications    acetaminophen  325 MG tablet Commonly known as: TYLENOL  Take 2 tablets (650 mg total) by mouth every 6 (six) hours as needed for mild pain (pain score 1-3) (or Fever >/= 101).   amLODipine  5 MG tablet Commonly known as: NORVASC  Take 1 tablet (5 mg total) by mouth daily. Start  taking on: December 30, 2023   atenolol  25 MG tablet Commonly known as: TENORMIN  Take 0.5 tablets (12.5 mg total) by mouth daily. Start taking on: December 30, 2023 What changed:  medication strength how much to take   calcium  carbonate 500 MG chewable tablet Commonly known as: TUMS - dosed in mg elemental calcium  Chew 1 tablet (200 mg of elemental calcium  total) by mouth as needed for indigestion or heartburn.   donepezil  10 MG tablet Commonly known as: ARICEPT  Take 10 mg by mouth daily.   feeding supplement Liqd Take 237 mLs by mouth 2 (two) times daily between meals.   FLUoxetine  10 MG capsule Commonly known as: PROZAC  Take 10 mg by mouth daily.   haloperidol  1 MG tablet Commonly known as: HALDOL  Take 1 tablet (1 mg total) by mouth every 6 (six) hours as needed for agitation.   levothyroxine  100 MCG tablet Commonly known as: SYNTHROID  Take 100 mcg by mouth daily.   multivitamin with minerals Tabs tablet Take 1 tablet by mouth daily. Start taking on: December 30, 2023   QUEtiapine  25 MG tablet Commonly known as: SEROQUEL  Take 1 tablet (25 mg total) by mouth at bedtime.   QUEtiapine  25 MG tablet Commonly known as: SEROQUEL  Take 1 tablet (25 mg total) by mouth daily as needed (agitation).        Follow-up Information     doctor at rehab Follow up in 1 day(s).                 Discharge Exam: Filed Weights   12/21/23 0500 12/23/23 0500 12/25/23 0520  Weight: 50.7 kg 51.3 kg 49.5 kg   Physical Exam HENT:     Head: Normocephalic.  Eyes:     General: Lids are normal.     Conjunctiva/sclera: Conjunctivae normal.  Cardiovascular:     Rate and Rhythm: Normal rate and regular rhythm.     Heart sounds: Normal heart sounds, S1 normal and S2 normal.  Pulmonary:     Breath sounds: No decreased breath sounds, wheezing, rhonchi or rales.  Abdominal:     Palpations: Abdomen is soft.     Tenderness: There is no abdominal tenderness.  Musculoskeletal:     Right  lower leg: No swelling.     Left lower leg: No swelling.  Skin:    General: Skin is warm.     Findings: No rash.  Neurological:     Mental Status: She is alert.      Condition at discharge: fair  The results of significant diagnostics from this hospitalization (including imaging, microbiology, ancillary and laboratory) are listed below for reference.   Imaging Studies: DG Chest 2 View Result Date: 12/03/2023 CLINICAL DATA:  Short of breath, concern for aspiration EXAM: CHEST - 2 VIEW COMPARISON:  01/07/2023 FINDINGS: Frontal and lateral views of the chest demonstrate a stable enlarged cardiac silhouette. No acute airspace disease, effusion, or pneumothorax. Areas of linear consolidation at the left lung base most consistent with subsegmental atelectasis or scarring. No acute bony abnormalities. IMPRESSION: 1. Left basilar subsegmental atelectasis or scarring. No acute airspace disease. Electronically Signed   By: Ozell Delores HERO.D.  On: 12/03/2023 18:03    Microbiology: Results for orders placed or performed during the hospital encounter of 09/25/23  Urine Culture     Status: Abnormal   Collection Time: 09/25/23  8:41 PM   Specimen: Urine, Clean Catch  Result Value Ref Range Status   Specimen Description   Final    URINE, CLEAN CATCH Performed at [facility], [address], [place], [place] [address]    Special Requests   Final    NONE Performed at [facility], [address] [place], [place] [address]    Culture (A)  Final    >=100,000 COLONIES/mL ESCHERICHIA COLI 30,000 COLONIES/mL PSEUDOMONAS AERUGINOSA    Report Status 09/29/2023 FINAL  Final   Organism ID, Bacteria ESCHERICHIA COLI (A)  Final   Organism ID, Bacteria PSEUDOMONAS AERUGINOSA (A)  Final      Susceptibility   Escherichia coli - MIC*    AMPICILLIN <=2 SENSITIVE Sensitive     CEFAZOLIN <=4 SENSITIVE Sensitive     CEFEPIME <=0.12 SENSITIVE Sensitive     CEFTRIAXONE  <=0.25 SENSITIVE  Sensitive     CIPROFLOXACIN  <=0.25 SENSITIVE Sensitive     GENTAMICIN <=1 SENSITIVE Sensitive     IMIPENEM <=0.25 SENSITIVE Sensitive     NITROFURANTOIN <=16 SENSITIVE Sensitive     TRIMETH/SULFA >=320 RESISTANT Resistant     AMPICILLIN/SULBACTAM <=2 SENSITIVE Sensitive     PIP/TAZO <=4 SENSITIVE Sensitive ug/mL    * >=100,000 COLONIES/mL ESCHERICHIA COLI   Pseudomonas aeruginosa - MIC*    CEFTAZIDIME 4 SENSITIVE Sensitive     CIPROFLOXACIN  <=0.25 SENSITIVE Sensitive     GENTAMICIN <=1 SENSITIVE Sensitive     IMIPENEM 1 SENSITIVE Sensitive     PIP/TAZO 8 SENSITIVE Sensitive ug/mL    CEFEPIME 2 SENSITIVE Sensitive     * 30,000 COLONIES/mL PSEUDOMONAS AERUGINOSA    Labs: CBC: No results for input(s): WBC, NEUTROABS, HGB, HCT, MCV, PLT in the last 168 hours. Basic Metabolic Panel: No results for input(s): NA, K, CL, CO2, GLUCOSE, BUN, CREATININE, CALCIUM , MG, PHOS in the last 168 hours. Liver Function Tests: No results for input(s): AST, ALT, ALKPHOS, BILITOT, PROT, ALBUMIN in the last 168 hours. CBG: No results for input(s): GLUCAP in the last 168 hours.  Discharge time spent: greater than 30 minutes.  Signed: Charlie Patterson, MD [facility] 12/29/2023

## 2023-12-29 NOTE — Progress Notes (Signed)
 Patient report given to RN eschel. All questions are addressed via phone. No any questions at this time.

## 2023-12-29 NOTE — TOC Transition Note (Signed)
 Transition of Care [facility]) - Discharge Note   Patient Details  Name: Allison [name] MRN: [id number] Date of Birth: 1932-12-01  Transition of [facility] CM/SW Contact:  Shyana Kulakowski A Krishan Mcbreen, RN Phone Number: 12/29/2023, 11:57 AM   Clinical Narrative:    Chart reviewed.  Noted that patient has orders for discharge today.  I have spoken with Isaiah from [facility].  She informs me that she has received letter of Intent for payment from DSS.  She reports that she can accept patient today.  Isaiah reports that patient will go to room 409B and the number to call report is [phone].  I have sent [facility] patient's discharge summary, Discharge orders and SNF transfer report via Epic hub.    I have informed DSS Social worker Othel Daring that patient will be discharging to [facility] today.    I have arranged EMS via [facility].  I have informed staff nurse of the above information.     Final next level of care: Skilled Nursing Facility Barriers to Discharge: No Barriers Identified   Patient Goals and CMS Choice Patient states their goals for this hospitalization and ongoing recovery are:: to go home CMS Medicare.gov Compare Post Acute Care list provided to:: Patient Choice offered to / list presented to : Patient      Discharge Placement              Patient chooses bed at: [facility] Patient to be transferred to facility by: [facility] Name of family member notified: Othel Daring, DSS Social worker Patient and family notified of of transfer: 12/29/23  Discharge Plan and Services Additional resources added to the After Visit Summary for                                       Social Drivers of Health (SDOH) Interventions SDOH Screenings   Food Insecurity: Patient Unable To Answer (09/26/2023)  Housing: High Risk (09/26/2023)  Transportation Needs: Patient Unable To Answer (09/26/2023)  Utilities: Patient  Unable To Answer (09/26/2023)  Social Connections: Patient Unable To Answer (12/22/2023)  Tobacco Use: Low Risk  (10/12/2023)     Readmission Risk Interventions     No data to display
